# Patient Record
Sex: Male | Born: 1940
Health system: Southern US, Community
[De-identification: ages and names within clinical notes are randomized; demographics above are authoritative.]

## PROBLEM LIST (undated history)

## (undated) DIAGNOSIS — N400 Enlarged prostate without lower urinary tract symptoms: Secondary | ICD-10-CM

## (undated) DIAGNOSIS — E785 Hyperlipidemia, unspecified: Secondary | ICD-10-CM

## (undated) DIAGNOSIS — L309 Dermatitis, unspecified: Secondary | ICD-10-CM

## (undated) DIAGNOSIS — N486 Induration penis plastica: Secondary | ICD-10-CM

## (undated) DIAGNOSIS — L509 Urticaria, unspecified: Secondary | ICD-10-CM

## (undated) DIAGNOSIS — H33319 Horseshoe tear of retina without detachment, unspecified eye: Secondary | ICD-10-CM

## (undated) DIAGNOSIS — H269 Unspecified cataract: Secondary | ICD-10-CM

## (undated) DIAGNOSIS — T7840XA Allergy, unspecified, initial encounter: Secondary | ICD-10-CM

## (undated) HISTORY — DX: Unspecified cataract: H26.9

## (undated) HISTORY — PX: TONSILLECTOMY: SUR1361

## (undated) HISTORY — DX: Induration penis plastica: N48.6

## (undated) HISTORY — DX: Dermatitis, unspecified: L30.9

## (undated) HISTORY — DX: Allergy, unspecified, initial encounter: T78.40XA

## (undated) HISTORY — DX: Horseshoe tear of retina without detachment, unspecified eye: H33.319

## (undated) HISTORY — DX: Hyperlipidemia, unspecified: E78.5

## (undated) HISTORY — PX: COLONOSCOPY: SHX174

## (undated) HISTORY — DX: Urticaria, unspecified: L50.9

## (undated) HISTORY — DX: Benign prostatic hyperplasia without lower urinary tract symptoms: N40.0

---

## 1992-11-06 HISTORY — PX: EYE SURGERY: SHX253

## 1999-09-20 ENCOUNTER — Emergency Department (HOSPITAL_COMMUNITY): Admission: EM | Admit: 1999-09-20 | Discharge: 1999-09-20 | Payer: Self-pay | Admitting: Emergency Medicine

## 1999-12-14 ENCOUNTER — Encounter: Admission: RE | Admit: 1999-12-14 | Discharge: 1999-12-14 | Payer: Self-pay | Admitting: Internal Medicine

## 1999-12-14 ENCOUNTER — Encounter: Payer: Self-pay | Admitting: Internal Medicine

## 2000-04-16 ENCOUNTER — Ambulatory Visit (HOSPITAL_COMMUNITY): Admission: RE | Admit: 2000-04-16 | Discharge: 2000-04-17 | Payer: Self-pay | Admitting: Ophthalmology

## 2000-04-16 ENCOUNTER — Encounter: Payer: Self-pay | Admitting: Ophthalmology

## 2000-04-24 ENCOUNTER — Encounter: Admission: RE | Admit: 2000-04-24 | Discharge: 2000-04-24 | Payer: Self-pay | Admitting: Internal Medicine

## 2000-04-24 ENCOUNTER — Encounter: Payer: Self-pay | Admitting: Internal Medicine

## 2001-01-28 ENCOUNTER — Encounter: Payer: Self-pay | Admitting: Internal Medicine

## 2001-01-28 ENCOUNTER — Encounter: Admission: RE | Admit: 2001-01-28 | Discharge: 2001-01-28 | Payer: Self-pay | Admitting: Internal Medicine

## 2004-02-24 ENCOUNTER — Encounter: Payer: Self-pay | Admitting: Ophthalmology

## 2004-02-25 ENCOUNTER — Ambulatory Visit (HOSPITAL_COMMUNITY): Admission: RE | Admit: 2004-02-25 | Discharge: 2004-02-25 | Payer: Self-pay | Admitting: Ophthalmology

## 2004-03-18 ENCOUNTER — Ambulatory Visit (HOSPITAL_COMMUNITY): Admission: RE | Admit: 2004-03-18 | Discharge: 2004-03-18 | Payer: Self-pay | Admitting: Ophthalmology

## 2004-07-12 ENCOUNTER — Encounter: Admission: RE | Admit: 2004-07-12 | Discharge: 2004-07-12 | Payer: Self-pay | Admitting: Internal Medicine

## 2005-03-27 ENCOUNTER — Ambulatory Visit (HOSPITAL_COMMUNITY): Admission: RE | Admit: 2005-03-27 | Discharge: 2005-03-27 | Payer: Self-pay | Admitting: Ophthalmology

## 2005-10-12 ENCOUNTER — Encounter: Admission: RE | Admit: 2005-10-12 | Discharge: 2005-10-12 | Payer: Self-pay | Admitting: Internal Medicine

## 2007-04-07 HISTORY — PX: EYE SURGERY: SHX253

## 2007-09-05 ENCOUNTER — Encounter: Admission: RE | Admit: 2007-09-05 | Discharge: 2007-09-05 | Payer: Self-pay | Admitting: Internal Medicine

## 2008-10-06 ENCOUNTER — Ambulatory Visit: Payer: Self-pay | Admitting: Internal Medicine

## 2009-10-14 ENCOUNTER — Ambulatory Visit: Payer: Self-pay | Admitting: Internal Medicine

## 2010-10-27 ENCOUNTER — Ambulatory Visit: Payer: Self-pay | Admitting: Internal Medicine

## 2011-01-26 ENCOUNTER — Encounter: Payer: Self-pay | Admitting: Gastroenterology

## 2011-02-02 NOTE — Letter (Signed)
Summary: Colonoscopy Letter  [facility]  [address] [place], [place] [address]   Phone: [phone]  Fax: [phone]      January 26, 2011 MRN: [id number]   KAYRON HICKLIN [address] [place], [place]  [address]   Dear Mr. [facility],   According to your medical record, it is time for you to schedule a Colonoscopy. The American Cancer Society recommends this procedure as a method to detect early colon cancer. Patients with a family history of colon cancer, or a personal history of colon polyps or inflammatory bowel disease are at increased risk.  This letter has been generated based on the recommendations made at the time of your procedure. If you feel that in your particular situation this may no longer apply, please contact our office.  Please call our office at [phone] to schedule this appointment or to update your records at your earliest convenience.  Thank you for cooperating with [place] to provide you with the very best care possible.   Sincerely,  Judie Petit T. Russella Dar, M.D.  [facility] Gastroenterology Division [phone]

## 2011-03-24 NOTE — Op Note (Signed)
NAMEACEYN, [name]               ACCOUNT NO.:  [id number]   MEDICAL RECORD NO.:  [id number]          PATIENT TYPE:  OIB   LOCATION:  2550                         FACILITY:  [facility]   PHYSICIAN:  Alford Highland. Rankin, M.D.   DATE OF BIRTH:  1941-02-02   DATE OF PROCEDURE:  03/27/2005  DATE OF DISCHARGE:  03/27/2005                                 OPERATIVE REPORT   PREOPERATIVE DIAGNOSES:  1.  Chronic recurrent cystoid macular edema, left eye.  2.  Epiretinal membrane, left eye.  3.  History of proliferative vitreoretinopathy, left eye and chronic      inflammatory debris in the vitreous cavity leading to #1 condition.   POSTOPERATIVE DIAGNOSES:  1.  Chronic recurrent cystoid macular edema, left eye.  2.  Epiretinal membrane, left eye.  3.  History of proliferative vitreoretinopathy, left eye and chronic      inflammatory debris in the vitreous cavity leading to #1 condition.   OPERATION PERFORMED:  1.  Posterior vitrectomy and membrane peel, left eye.  2.  Endolaser panretinal photocoagulation, retinopexy left eye posterior to      previous scars.   SURGEON:  Alford Highland. Rankin, M.D.   ANESTHESIA:  Local retrobulbar with monitored anesthesia control.   INDICATIONS FOR PROCEDURE:  The patient is a 70 year old man who has  profound vision loss on the basis of chronic recurrent cystoid macular edema  and this is an attempt to release and remove possible vitreal inflammatory  debris as well as iridovitreal and possibly vitreocapsular adhesions leading  to recurrent cystoid macular edema, left eye.  The patient has a history of  proliferative vitreoretinopathy, chronic retinal detachment repaired with  complex retinal detachment repair.  This is an attempt to improve vascular  function.  The patient understands the risks of anesthesia including the  rare occurrence of death but also to the eye including but not limited to  hemorrhage, infection, scarring, need for another surgery, no change  in  vision, loss of vision and progression of disease despite intervention.  After appropriate signed consent was obtained, the patient was taken to the  operating room.   DESCRIPTION OF PROCEDURE:  In the operating room, appropriate monitoring was  followed by mild sedation.  0.75% Marcaine delivered 5 cc retrobulbar  followed by an additional 5 cc laterally in the fashion of modified Darel Hong.  Left periocular region was sterilely prepped and draped in the usual  ophthalmic fashion.  Microscope placed in position with the Biom attached.  A 25 gauge trocar system was then used in the inferotemporal quadrant with  displacement of the conjunctiva.  The infusion was turned on.  Superior  trocars were placed.  Core vitrectomy was then begun on the 25 gauge system.  Vitreus was removed from the anterior segment attachments apparently to the  iris and capsule as well as the removal of the vitreous skirt.  No posterior  vitreoretinal traction was done on the macular region.  There was no  residual vitreous over the posterior pole or nasal to the optic nerve.  Vitreous skirt trimmed 360 degrees.  Endolaser photocoagulation placed  posterior to the previously treated retina, so as to minimize recurrence of  the retinal tears and detachments after tractional forces had been exerted.  At this time epiretinal forceps were then used to remove internal limiting  membrane.  Partial removal was carried out superiorly to the junction of the  macular region.  Decision was made to leave this in place because there  appeared to be no topographic distortion or internal limiting membrane  striae.   At this time the instruments were removed from the eye.  Under low infusion  pressures, the trocar system was removed.  The conjunctiva was displaced and  compression of the sclerotomies was then carried out for approximately 45  seconds.  The other two trocars were removed in similar fashion.  Intraocular  pressure assessed and found to be adequate. Infranasal placement  of steroids was applied subconjunctivally.  Sterile patch and Fox shield  applied.  The patient tolerated the procedure well without complication.       GAR/MEDQ  D:  03/27/2005  T:  03/27/2005  Job:  [id number]

## 2011-03-24 NOTE — Op Note (Signed)
[name], [name]                         ACCOUNT NO.:  [id number]   MEDICAL RECORD NO.:  [id number]                   PATIENT TYPE:  OIB   LOCATION:  2899                                 FACILITY:  [facility]   PHYSICIAN:  Alford Highland. Rankin, M.D.                DATE OF BIRTH:  1941-03-18   DATE OF PROCEDURE:  03/18/2004  DATE OF DISCHARGE:  03/18/2004                                 OPERATIVE REPORT   PREOPERATIVE DIAGNOSES:  1. Proliferative vitreoretinopathy, stage C2 combined with  2. Rhegmatogenous retinal detachment, tractional detachment to the right     eye.   POSTOPERATIVE DIAGNOSES:  1. Proliferative vitreoretinopathy, stage C2 combined with  2. Rhegmatogenous retinal detachment, tractional detachment to the right     eye.   OPERATION PERFORMED:  1. Posterior vitrectomy and membrane peel, right eye.  2. Endolaser photocoagulation for retinopexy purposes.  3. Injection of vitreous substitute, C3F8 10%.   SURGEON:  Alford Highland. Rankin, M.D.   ANESTHESIA:  Local bulbar with monitored anesthesia control.   INDICATIONS FOR PROCEDURE:  The patient is a 70 year old man who has  recurrent rhegmatogenous retinal detachment combined with tractional changes  from proliferative vitreoretinopathy.  Initially, superior retinal  detachment, superior breaks had been previously repaired with scleral buckle  and he now has a recurrent detachment.  It is inferior with inferior retinal  breaks.  The patient understands this is an attempt to release the fibrous  contracture in this area by removing the vitreous scaffold and the  epiretinal scar tissues triggering this recurrent detachment.  The patient understands the risks of anesthesia including the rare  occurrence of death but also to the eye including but not limited to  hemorrhage, infection, scarring, need for another surgery, no change in  vision, loss of vision and progression of disease despite intervention.  After appropriate signed  consent was obtained, the patient was taken to the  operating room.   DESCRIPTION OF PROCEDURE:  In the operating room, appropriate monitoring was  followed by mild sedation.  0.75% Marcaine delivered 5 mL retrobulbar  followed by an additional 5 mL laterally in the fashion of modified Darel Hong.  The right periocular region was sterilely prepped and draped in the  usual ophthalmic fashion.  A lid speculum was applied.  Conjunctival  peritomy was fashioned supranasally and temporally.  A 4 mm infusion secured  3.5 mm posterior to the limbus in the infratemporal quadrant.  Placement in  the vitreous cavity verified visually.  Superior sclerotomies then  fashioned.  Wild microscope was placed in position with the Biom attachment.  Core vitrectomy was then begun.  Peripheral vitreous and hyaloid was removed  using vitreous base dissection techniques.  Excellent mobilization of the  retina occurred.  Necessary to perform inferior retinotomy anterior to the  optic nerve with endo diathermy.  Fluid-fluid exchange carried out.  Fluid-  air exchange carried  out.  Endolaser panphotocoagulation was placed around  the retinotomy site as well as around the retinal breaks on the slope of the  posterior slope buckle.  The retina reattached nicely without difficulty.  An air-C3F8 10% exchange was then completed.  Superior sclerotomy was closed  with 7-0 Vicryl.  Infusion was removed and similarly closed with 7-0 Vicryl.  Conjunctiva closed with 7-0 Vicryl.  Intraocular pressure was assessed and  found to be adequate at less than 20 mm.  Sterile patch and Fox shield were  applied after subconjunctival injection of antibiotic and steroid had been  applied.  The patient tolerated the procedure well without complication. The  patient was then transferred to the short stay area to be discharged home as  outpatient.                                               Alford Highland Rankin, M.D.    GAR/MEDQ  D:   03/23/2004  T:  03/23/2004  Job:  [id number]

## 2011-03-24 NOTE — Op Note (Signed)
[name], [name]                         ACCOUNT NO.:  [id number]   MEDICAL RECORD NO.:  [id number]                   PATIENT TYPE:  OIB   LOCATION:  2857                                 FACILITY:  [facility]   PHYSICIAN:  Alford Highland. Rankin, M.D.                DATE OF BIRTH:  [birth year]   DATE OF PROCEDURE:  02/25/2004  DATE OF DISCHARGE:  02/25/2004                                 OPERATIVE REPORT   PREOPERATIVE DIAGNOSIS:  Rhegmatogenous retinal detachment of the right eye  - macula off.   POSTOPERATIVE DIAGNOSIS:  Rhegmatogenous retinal detachment of the right eye  - macula off.   PROCEDURE:  1. Scleral buckle using elements 240, 70, and 287 explants with retinal     cryopexy, OD.  2. External drainage of subretinal fluid, OD.   SURGEON:  Alford Highland. Rankin, M.D.   ANESTHESIA:  General endotracheal anesthesia.   INDICATIONS FOR PROCEDURE:  The patient is a 70 year old man who has  profound vision loss on the basis of rhegmatogenous retinal detachment.  This is an attempt to reattach the retina.  The understands the risks of  anesthesia including the occurrence of death and also to the eye, including  but not limited to hemorrhage, infection, scarring, need for further  surgery, no change in vision, loss of vision, and even loss of the eye.  He  wished to proceed and appropriate signed consent was obtained.   DESCRIPTION OF PROCEDURE:  The patient was taken to the operating room.  General endotracheal anesthesia was instituted without difficulty.  The  right periocular region was sterilely prepped and draped in the usual  ophthalmic fashion.  A lid speculum was applied.  A conjunctival peritomy  was then fashioned 360 degrees.  The rectus muscles were isolated on 2-0  silk ties.  Cryopexy was applied to multiple nearly side by side retinal  horseshoe tears in the temporal periphery of the right eye extending from  the 5:30 position temporally and superiorly to the 12:30 position  OD.  These  were treated with retinal cryopexy under direct observation.  A silicone  explant 287 was selected and placed from the 1 o'clock position extending  temporally to the 9 o'clock and then extending inferiorly to the 6 o'clock  and over to the 5 o'clock with a 240 encircling band and a 7-0 Watzke sleeve  which was placed in the superonasal quadrant.  Two sutures were placed in  each of the temporal quadrants, one each in the nasal quadrants.  These  secured the buckle without difficulty in a temporary fashion.  External  drainage of subretinal fluid was carried out under direct observation in the  bed of the buckle with a 26 gauge needle.  All fluid was removed without  difficulty.  The buckle was then tightened permanently and securely.  All  subretinal fluid was removed without complications.  At this time, the  bed  of the buckle was irrigated.  The bands were trimmed.  The band was  tightened appropriately.  The conjunctiva and tenons were brought forward  and closed in layers with interrupted 7-0 Vicryl suture.  A subconjunctival  injection of antibiotics were applied.  The patient tolerated the procedure  well without complications.                                               Alford Highland Rankin, M.D.    GAR/MEDQ  D:  02/25/2004  T:  02/26/2004  Job:  [id number]

## 2011-06-30 ENCOUNTER — Encounter: Payer: Self-pay | Admitting: Internal Medicine

## 2011-07-06 ENCOUNTER — Ambulatory Visit (INDEPENDENT_AMBULATORY_CARE_PROVIDER_SITE_OTHER): Payer: Medicare Other | Admitting: Internal Medicine

## 2011-07-06 ENCOUNTER — Encounter: Payer: Self-pay | Admitting: Internal Medicine

## 2011-07-06 DIAGNOSIS — N486 Induration penis plastica: Secondary | ICD-10-CM | POA: Insufficient documentation

## 2011-07-06 DIAGNOSIS — N529 Male erectile dysfunction, unspecified: Secondary | ICD-10-CM

## 2011-07-06 DIAGNOSIS — E785 Hyperlipidemia, unspecified: Secondary | ICD-10-CM | POA: Insufficient documentation

## 2011-07-06 DIAGNOSIS — F411 Generalized anxiety disorder: Secondary | ICD-10-CM

## 2011-07-06 DIAGNOSIS — F419 Anxiety disorder, unspecified: Secondary | ICD-10-CM

## 2011-07-06 DIAGNOSIS — N400 Enlarged prostate without lower urinary tract symptoms: Secondary | ICD-10-CM

## 2011-07-06 NOTE — Progress Notes (Signed)
  Subjective:    Patient ID: [name], male    DOB: Jun 21, 1941, 70 y.o.   MRN: [id number]  HPI pleasant healthy 70 year old white male whose wife died of end-stage COPD about 2 years ago. He spent many years taking care of her. He has a history of BPH and Peyronie's disease. History of hyperlipidemia.  Recently he met a lady at church and they began dating. He is quite happy. He is concerned however about erectile dysfunction and has anxiety about that. He has not been sexually active with her as he feels it is wrong to have premarital sex. Patient has been able to get an erection in the mornings when awakening.    Review of Systems     Objective:   Physical Exam not examined        Assessment & Plan:  Counseled patient for some 20 minutes regarding these issues. Gave him samples of Cialis 5 mg daily for daily use. Alternatively he could consider taking 10-20 mg of Cialis one hour before intercourse. For anxiety gave him Xanax 0.5 mg #30 one half to one by mouth when necessary anxiety with 1 refill.

## 2011-09-21 ENCOUNTER — Ambulatory Visit (INDEPENDENT_AMBULATORY_CARE_PROVIDER_SITE_OTHER): Payer: Medicare Other | Admitting: Internal Medicine

## 2011-09-21 DIAGNOSIS — Z23 Encounter for immunization: Secondary | ICD-10-CM

## 2011-10-19 ENCOUNTER — Encounter: Payer: Self-pay | Admitting: Internal Medicine

## 2011-10-19 ENCOUNTER — Ambulatory Visit (INDEPENDENT_AMBULATORY_CARE_PROVIDER_SITE_OTHER): Payer: Medicare Other | Admitting: Internal Medicine

## 2011-10-19 DIAGNOSIS — N400 Enlarged prostate without lower urinary tract symptoms: Secondary | ICD-10-CM

## 2011-10-19 DIAGNOSIS — Z0000 Encounter for general adult medical examination without abnormal findings: Secondary | ICD-10-CM

## 2011-10-19 DIAGNOSIS — Z23 Encounter for immunization: Secondary | ICD-10-CM

## 2011-10-19 DIAGNOSIS — R6882 Decreased libido: Secondary | ICD-10-CM

## 2011-10-19 DIAGNOSIS — N529 Male erectile dysfunction, unspecified: Secondary | ICD-10-CM

## 2011-10-19 LAB — CBC WITH DIFFERENTIAL/PLATELET
Basophils Absolute: 0 10*3/uL (ref 0.0–0.1)
Basophils Relative: 1 % (ref 0–1)
Eosinophils Absolute: 0.1 10*3/uL (ref 0.0–0.7)
Eosinophils Relative: 3 % (ref 0–5)
HCT: 47 % (ref 39.0–52.0)
Hemoglobin: 15.4 g/dL (ref 13.0–17.0)
Lymphocytes Relative: 33 % (ref 12–46)
Lymphs Abs: 1.8 10*3/uL (ref 0.7–4.0)
MCH: 29.3 pg (ref 26.0–34.0)
MCHC: 32.8 g/dL (ref 30.0–36.0)
MCV: 89.4 fL (ref 78.0–100.0)
Monocytes Absolute: 0.7 10*3/uL (ref 0.1–1.0)
Monocytes Relative: 13 % — ABNORMAL HIGH (ref 3–12)
Neutro Abs: 2.9 10*3/uL (ref 1.7–7.7)
Neutrophils Relative %: 51 % (ref 43–77)
Platelets: 317 10*3/uL (ref 150–400)
RBC: 5.26 MIL/uL (ref 4.22–5.81)
RDW: 14.1 % (ref 11.5–15.5)
WBC: 5.6 10*3/uL (ref 4.0–10.5)

## 2011-10-19 LAB — COMPREHENSIVE METABOLIC PANEL
ALT: 13 U/L (ref 0–53)
AST: 18 U/L (ref 0–37)
Albumin: 4.6 g/dL (ref 3.5–5.2)
Alkaline Phosphatase: 104 U/L (ref 39–117)
BUN: 17 mg/dL (ref 6–23)
CO2: 30 mEq/L (ref 19–32)
Calcium: 10.3 mg/dL (ref 8.4–10.5)
Chloride: 102 mEq/L (ref 96–112)
Creat: 0.78 mg/dL (ref 0.50–1.35)
Glucose, Bld: 90 mg/dL (ref 70–99)
Potassium: 4.8 mEq/L (ref 3.5–5.3)
Sodium: 141 mEq/L (ref 135–145)
Total Bilirubin: 0.7 mg/dL (ref 0.3–1.2)
Total Protein: 7.3 g/dL (ref 6.0–8.3)

## 2011-10-19 LAB — TESTOSTERONE: Testosterone: 743.13 ng/dL (ref 250–890)

## 2011-10-19 LAB — PSA: PSA: 3.8 ng/mL (ref <=4.00)

## 2011-10-19 LAB — LIPID PANEL
Cholesterol: 215 mg/dL — ABNORMAL HIGH (ref 0–200)
HDL: 54 mg/dL (ref >39)
LDL Cholesterol: 140 mg/dL — ABNORMAL HIGH (ref 0–99)
Total CHOL/HDL Ratio: 4 Ratio
Triglycerides: 107 mg/dL (ref <150)
VLDL: 21 mg/dL (ref 0–40)

## 2011-10-19 MED ORDER — PNEUMOCOCCAL VAC POLYVALENT 25 MCG/0.5ML IJ INJ: 0.5000 mL | INJECTION | Freq: Once | INTRAMUSCULAR | Status: DC

## 2011-11-02 ENCOUNTER — Encounter: Payer: Medicare Other | Admitting: Internal Medicine

## 2011-11-06 ENCOUNTER — Encounter: Payer: Self-pay | Admitting: Internal Medicine

## 2011-11-06 NOTE — Progress Notes (Signed)
  Subjective:    Patient ID: [name], male    DOB: [birth year], 70 y.o.   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 History of BPH. Recently he met a lady through his church and they have become sexually active. He's having some issues with erectile dysfunction. Has tried samples of Cialis with some success but can't sustain erection. This is causing some anxiety. Patient does not smoke. No history of hypertension. No history of diabetes. He is retired but stays physically active. No obesity.    Review of Systems  Constitutional: Negative.   HENT: Negative.   Eyes: Negative.   Cardiovascular: Negative.   Gastrointestinal: Negative.   Genitourinary:       Erectile dysfunction  Musculoskeletal:       Minor aches and pains  Neurological: Negative.   Hematological: Negative.   Psychiatric/Behavioral: Negative.        Objective:   Physical Exam  Vitals reviewed. Constitutional: He is oriented to person, place, and time. He appears well-nourished. No distress.  HENT:  Head: Normocephalic and atraumatic.  Right Ear: External ear normal.  Left Ear: External ear normal.  Nose: Nose normal.  Mouth/Throat: Oropharynx is clear and moist.  Eyes: Conjunctivae and EOM are normal. Pupils are equal, round, and reactive to light. Right eye exhibits no discharge. Left eye exhibits no discharge. No scleral icterus.  Neck: Neck supple. No JVD present. No thyromegaly present.  Cardiovascular: Normal rate, regular rhythm, normal heart sounds and intact distal pulses.   No murmur heard. Pulmonary/Chest: He has no wheezes. He has no rales.  Abdominal: Soft. Bowel sounds are normal. He exhibits no distension and no mass. There is no tenderness. There is no rebound and no guarding.  Genitourinary: Penis normal.       Enlarged prostate without nodules  Musculoskeletal: Normal range of motion. He  exhibits no edema.  Lymphadenopathy:    He has no cervical adenopathy.  Neurological: He is alert and oriented to person, place, and time. He has normal reflexes. No cranial nerve deficit. Coordination normal.  Skin: Skin is warm and dry. No rash noted.  Psychiatric: He has a normal mood and affect. His behavior is normal. Judgment normal.          Assessment & Plan:  BPH  Erectile dysfunction  History of mild hyperlipidemia  Plan: Return one year or as needed. Pneumococcal vaccine given today.

## 2011-11-06 NOTE — Patient Instructions (Addendum)
Try medication for erectile dysfunction one hour prior to intercourse. Return one year or as needed. Pneumococcal vaccine given today.

## 2012-03-18 ENCOUNTER — Other Ambulatory Visit: Payer: Self-pay | Admitting: Internal Medicine

## 2012-07-01 ENCOUNTER — Other Ambulatory Visit: Payer: Self-pay | Admitting: Internal Medicine

## 2012-07-24 ENCOUNTER — Encounter: Payer: Self-pay | Admitting: Gastroenterology

## 2012-11-01 ENCOUNTER — Encounter: Payer: Self-pay | Admitting: Internal Medicine

## 2012-11-01 ENCOUNTER — Other Ambulatory Visit (INDEPENDENT_AMBULATORY_CARE_PROVIDER_SITE_OTHER): Payer: Medicare Other | Admitting: Internal Medicine

## 2012-11-01 ENCOUNTER — Ambulatory Visit (INDEPENDENT_AMBULATORY_CARE_PROVIDER_SITE_OTHER): Payer: Medicare Other | Admitting: Internal Medicine

## 2012-11-01 VITALS — BP 130/80 | HR 96 | Temp 98.5°F | Ht 73.5 in | Wt 158.0 lb

## 2012-11-01 DIAGNOSIS — N486 Induration penis plastica: Secondary | ICD-10-CM

## 2012-11-01 DIAGNOSIS — Z79899 Other long term (current) drug therapy: Secondary | ICD-10-CM

## 2012-11-01 DIAGNOSIS — Z0000 Encounter for general adult medical examination without abnormal findings: Secondary | ICD-10-CM

## 2012-11-01 DIAGNOSIS — Z23 Encounter for immunization: Secondary | ICD-10-CM

## 2012-11-01 DIAGNOSIS — N400 Enlarged prostate without lower urinary tract symptoms: Secondary | ICD-10-CM

## 2012-11-01 DIAGNOSIS — E785 Hyperlipidemia, unspecified: Secondary | ICD-10-CM

## 2012-11-01 DIAGNOSIS — N529 Male erectile dysfunction, unspecified: Secondary | ICD-10-CM

## 2012-11-01 LAB — COMPREHENSIVE METABOLIC PANEL
ALT: 14 U/L (ref 0–53)
AST: 18 U/L (ref 0–37)
Albumin: 4.5 g/dL (ref 3.5–5.2)
Alkaline Phosphatase: 98 U/L (ref 39–117)
BUN: 16 mg/dL (ref 6–23)
CO2: 30 mEq/L (ref 19–32)
Calcium: 9.7 mg/dL (ref 8.4–10.5)
Chloride: 101 mEq/L (ref 96–112)
Creat: 0.9 mg/dL (ref 0.50–1.35)
Glucose, Bld: 93 mg/dL (ref 70–99)
Potassium: 5.4 mEq/L — ABNORMAL HIGH (ref 3.5–5.3)
Sodium: 141 mEq/L (ref 135–145)
Total Bilirubin: 0.4 mg/dL (ref 0.3–1.2)
Total Protein: 7.1 g/dL (ref 6.0–8.3)

## 2012-11-01 LAB — CBC WITH DIFFERENTIAL/PLATELET
Basophils Absolute: 0.1 10*3/uL (ref 0.0–0.1)
Basophils Relative: 1 % (ref 0–1)
Eosinophils Absolute: 0.2 10*3/uL (ref 0.0–0.7)
Eosinophils Relative: 4 % (ref 0–5)
HCT: 44.9 % (ref 39.0–52.0)
Hemoglobin: 15.3 g/dL (ref 13.0–17.0)
Lymphocytes Relative: 31 % (ref 12–46)
Lymphs Abs: 1.5 10*3/uL (ref 0.7–4.0)
MCH: 29.8 pg (ref 26.0–34.0)
MCHC: 34.1 g/dL (ref 30.0–36.0)
MCV: 87.5 fL (ref 78.0–100.0)
Monocytes Absolute: 0.7 10*3/uL (ref 0.1–1.0)
Monocytes Relative: 14 % — ABNORMAL HIGH (ref 3–12)
Neutro Abs: 2.4 10*3/uL (ref 1.7–7.7)
Neutrophils Relative %: 50 % (ref 43–77)
Platelets: 359 10*3/uL (ref 150–400)
RBC: 5.13 MIL/uL (ref 4.22–5.81)
RDW: 13.9 % (ref 11.5–15.5)
WBC: 4.7 10*3/uL (ref 4.0–10.5)

## 2012-11-01 LAB — LIPID PANEL
Cholesterol: 251 mg/dL — ABNORMAL HIGH (ref 0–200)
HDL: 50 mg/dL (ref >39)
LDL Cholesterol: 174 mg/dL — ABNORMAL HIGH (ref 0–99)
Total CHOL/HDL Ratio: 5 Ratio
Triglycerides: 136 mg/dL (ref <150)
VLDL: 27 mg/dL (ref 0–40)

## 2012-11-01 NOTE — Progress Notes (Signed)
Subjective:    Patient ID: [name], male    DOB: [birth year], 71 y.o.   MRN: [id number]  HPI 71 year old White male for health maintenance. Sees Dr. Earlene Plater for urology. Hx BPH and erectile dysfunction. History of Peyronie's disease and hyperlipidemia. His wife died a few years ago of complications from COPD. He subsequently has met a lady and began dating. They are sexually active. He seems happy but doesn't think he wants to remarry.  He has tried samples of Cialis with some success but can't sustain erection. This is causing some anxiety.  I have asked him to speak with urologist about this.  He does not smoke. No history of hypertension. No history of diabetes. He is retired and stays physically active. Likes to hunt ride his motorcycle.  Social history: He is a widow work, completed high school and worked as a Merchandiser, retail for [employer].  Family history: Father died at age 68 with cancer. One brother in good health. He lost his son in a car accident a number of years before his wife died. He has no other children.  He had tonsillectomy 1949, bilateral cataract extraction 1994, surgery for retinal detachment of the left eye by Dr. Luciana Axe June 2001. Had colonoscopy in March 2005. Pneumovax immunization December 2012. Has had positional vertigo in the past an episode of cervical radiculopathy. Had exercise tolerance test 1999  He had a recent URI in November. He was seen in urgent care and was treated with prednisone and a cephalosporin.    Review of Systems  Constitutional: Negative.   HENT: Negative.   Eyes: Negative.   Respiratory: Negative.   Cardiovascular: Negative.   Gastrointestinal: Negative.   Genitourinary:       Erectile dysfunction and BPH  Neurological: Negative.   Hematological: Negative.   Psychiatric/Behavioral: Negative.        Objective:   Physical Exam  Vitals reviewed. Constitutional: He is oriented to person, place, and time. He appears  well-developed and well-nourished. No distress.  HENT:  Head: Normocephalic and atraumatic.  Right Ear: External ear normal.  Left Ear: External ear normal.  Mouth/Throat: No oropharyngeal exudate.  Eyes: Conjunctivae normal and EOM are normal. Pupils are equal, round, and reactive to light. Right eye exhibits no discharge. Left eye exhibits no discharge. No scleral icterus.  Neck: Neck supple. No JVD present. No thyromegaly present.  Cardiovascular: Normal rate, regular rhythm, normal heart sounds and intact distal pulses.   No murmur heard. Pulmonary/Chest: Effort normal and breath sounds normal. He has no wheezes. He has no rales. He exhibits no tenderness.  Abdominal: Soft. Bowel sounds are normal. He exhibits no distension and no mass. There is no tenderness. There is no rebound and no guarding.  Genitourinary:       Deferred to urologist whom he'll see next week  Musculoskeletal: He exhibits no edema.  Lymphadenopathy:    He has no cervical adenopathy.  Neurological: He is alert and oriented to person, place, and time. He has normal reflexes. No cranial nerve deficit. Coordination normal.  Skin: Skin is dry. No rash noted. He is not diaphoretic.  Psychiatric: He has a normal mood and affect. His behavior is normal. Judgment and thought content normal.          Assessment & Plan:  Hyperlipidemia  Erectile dysfunction  BPH  History of Peyronie's disease  Plan: Continue statin therapy and return in one year. Continue to see urologist and ask urologist about erectile dysfunction  treatment. Blood pressure was initially elevated on arrival but was rechecked and was 130/80.           Subjective:   Patient presents for Medicare Annual/Subsequent preventive examination.   Review Past Medical/Family/Social:See Epic   Risk Factors  Current exercise habits: walk , rides motorcycle. hunts Dietary issues discussed: low fat low carb   Cardiac risk factors:  Hyperlipidemia  Depression Screen  (Note: if answer to either of the following is "Yes", a more complete depression screening is indicated)   Over the past two weeks, have you felt down, depressed or hopeless? No  Over the past two weeks, have you felt little interest or pleasure in doing things? No Have you lost interest or pleasure in daily life? No Do you often feel hopeless? No Do you cry easily over simple problems? No   Activities of Daily Living  In your present state of health, do you have any difficulty performing the following activities?:   Driving? No  Managing money? No  Feeding yourself? No  Getting from bed to chair? No  Climbing a flight of stairs? No  Preparing food and eating?: No  Bathing or showering? No  Getting dressed: No  Getting to the toilet? No  Using the toilet:No  Moving around from place to place: No  In the past year have you fallen or had a near fall?:No  Are you sexually active? yes Do you have more than one partner? No   Hearing Difficulties: No  Do you often ask people to speak up or repeat themselves? No  Do you experience ringing or noises in your ears? No  Do you have difficulty understanding soft or whispered voices? No  Do you feel that you have a problem with memory? No Do you often misplace items? No    Home Safety:  Do you have a smoke alarm at your residence? Yes Do you have grab bars in the bathroom? yes Do you have throw rugs in your house? no   Cognitive Testing  Alert? Yes Normal Appearance?Yes  Oriented to person? Yes Place? Yes  Time? Yes  Recall of three objects? Yes  Can perform simple calculations? Yes  Displays appropriate judgment?Yes  Can read the correct time from a watch face?Yes   List the Names of Other Physician/Practitioners you currently use:  See referral list for the physicians patient is currently seeing. Dr. Darvin Neighbours. Urology    Review of Systems: see EPIC   Objective:     General  appearance: Appears stated age  Head: Normocephalic, without obvious abnormality, atraumatic  Eyes: conj clear, EOMi PEERLA  Ears: normal TM's and external ear canals both ears  Nose: Nares normal. Septum midline. Mucosa normal. No drainage or sinus tenderness.  Throat: lips, mucosa, and tongue normal; teeth and gums normal  Neck: no adenopathy, no carotid bruit, no JVD, supple, symmetrical, trachea midline and thyroid not enlarged, symmetric, no tenderness/mass/nodules  No CVA tenderness.  Lungs: clear to auscultation bilaterally  Breasts: normal male Heart: regular rate and rhythm, S1, S2 normal, no murmur, click, rub or gallop  Abdomen: soft, non-tender; bowel sounds normal; no masses, no organomegaly  Musculoskeletal: ROM normal in all joints, no crepitus, no deformity, Normal muscle strengthen. Back  is symmetric, no curvature. Skin: Skin color, texture, turgor normal. No rashes or lesions  Lymph nodes: Cervical, supraclavicular, and axillary nodes normal.  Neurologic: CN 2 -12 Normal, Normal symmetric reflexes. Normal coordination and gait  Psych: Alert & Oriented x  3, Mood appear stable.    Assessment:    Annual wellness medicare exam   Plan:    During the course of the visit the patient was educated and counseled about appropriate screening and preventive services including:  Influenza Tdap Had pneumovax 2012       Patient Instructions (the written plan) was given to the patient.  Medicare Attestation  I have personally reviewed:  The patient's medical and social history  Their use of alcohol, tobacco or illicit drugs  Their current medications and supplements  The patient's functional ability including ADLs,fall risks, home safety risks, cognitive, and hearing and visual impairment  Diet and physical activities  Evidence for depression or mood disorders  The patient's weight, height, BMI, and visual acuity have been recorded in the chart. I have made referrals,  counseling, and provided education to the patient based on review of the above and I have provided the patient with a written personalized care plan for preventive services.

## 2012-11-03 NOTE — Patient Instructions (Addendum)
Watch diet and exercise. Return in one year or as needed. 

## 2012-11-04 DIAGNOSIS — N400 Enlarged prostate without lower urinary tract symptoms: Secondary | ICD-10-CM | POA: Insufficient documentation

## 2012-11-04 DIAGNOSIS — Z23 Encounter for immunization: Secondary | ICD-10-CM

## 2012-11-04 DIAGNOSIS — R972 Elevated prostate specific antigen [PSA]: Secondary | ICD-10-CM | POA: Insufficient documentation

## 2012-11-04 DIAGNOSIS — N529 Male erectile dysfunction, unspecified: Secondary | ICD-10-CM | POA: Insufficient documentation

## 2012-11-04 DIAGNOSIS — N320 Bladder-neck obstruction: Secondary | ICD-10-CM | POA: Insufficient documentation

## 2012-11-14 ENCOUNTER — Other Ambulatory Visit: Payer: Self-pay | Admitting: Internal Medicine

## 2012-11-19 ENCOUNTER — Other Ambulatory Visit: Payer: Self-pay | Admitting: Internal Medicine

## 2013-03-12 ENCOUNTER — Other Ambulatory Visit: Payer: Self-pay | Admitting: Internal Medicine

## 2013-03-12 NOTE — Telephone Encounter (Signed)
Refill Xanax 0.5 mg with one refill 1/2-1 po bid prn anxiety

## 2013-03-13 ENCOUNTER — Other Ambulatory Visit: Payer: Self-pay

## 2013-07-28 ENCOUNTER — Encounter: Payer: Self-pay | Admitting: Gastroenterology

## 2013-09-03 ENCOUNTER — Ambulatory Visit (INDEPENDENT_AMBULATORY_CARE_PROVIDER_SITE_OTHER): Payer: Medicare Other | Admitting: Gastroenterology

## 2013-09-03 ENCOUNTER — Encounter: Payer: Self-pay | Admitting: Gastroenterology

## 2013-09-03 VITALS — BP 112/60 | HR 100 | Ht 73.25 in | Wt 156.2 lb

## 2013-09-03 DIAGNOSIS — Z8371 Family history of colonic polyps: Secondary | ICD-10-CM

## 2013-09-03 DIAGNOSIS — R1319 Other dysphagia: Secondary | ICD-10-CM

## 2013-09-03 MED ORDER — PEG-KCL-NACL-NASULF-NA ASC-C 100 G PO SOLR: 1.0000 | Freq: Once | ORAL | Status: DC

## 2013-09-03 NOTE — Progress Notes (Signed)
    History of Present Illness: This is a 72 year old male who relates difficulties swallowing large pills and occasionally breakfast foods. He localizes the symptoms to the base of his neck. He states he has frequent sinus drainage and very dry mouth upon awakening. He does not note solid food dysphagia later in the day. Denies reflux symptoms. Denies weight loss, abdominal pain, constipation, diarrhea, change in stool caliber, melena, hematochezia, nausea, vomiting, reflux symptoms, chest pain.  Review of Systems: Pertinent positive and negative review of systems were noted in the above HPI section. All other review of systems were otherwise negative.  Current Medications, Allergies, Past Medical History, Past Surgical History, Family History and Social History were reviewed in [employer] record.  Physical Exam: General: Well developed , well nourished, no acute distress Head: Normocephalic and atraumatic Eyes:  sclerae anicteric, EOMI Ears: Normal auditory acuity Mouth: No deformity or lesions Neck: Supple, no masses or thyromegaly Lungs: Clear throughout to auscultation Heart: Regular rate and rhythm; no murmurs, rubs or bruits Abdomen: Soft, non tender and non distended. No masses, hepatosplenomegaly or hernias noted. Normal Bowel sounds Rectal: Deferred to colonoscopy Musculoskeletal: Symmetrical with no gross deformities  Skin: No lesions on visible extremities Pulses:  Normal pulses noted Extremities: No clubbing, cyanosis, edema or deformities noted Neurological: Alert oriented x 4, grossly nonfocal Cervical Nodes:  No significant cervical adenopathy Inguinal Nodes: No significant inguinal adenopathy Psychological:  Alert and cooperative. Normal mood and affect  Assessment and Recommendations:  1. Dysphagia with large pills and solid foods only in the morning. I suspect his symptoms are pharyngeal. Rule out esophageal strictures with barium esophagram.  If esophageal abnormalities present will plan to proceed with upper endoscopy at the time of his colonoscopy.  2. Family history of colon polyps in his mother. Schedule colonoscopy. The risks, benefits, and alternatives to colonoscopy with possible biopsy and possible polypectomy were discussed with the patient and they consent to proceed.

## 2013-09-03 NOTE — Patient Instructions (Addendum)
You have been scheduled for a Barium Esophogram at [facility] (1st floor of the hospital) on 09/05/13 at 10:00am. Please arrive 15 minutes prior to your appointment for registration. Make certain not to have anything to eat or drink 6 hours prior to your test. If you need to reschedule for any reason, please contact radiology at [phone] to do so. __________________________________________________________________ A barium swallow is an examination that concentrates on views of the esophagus. This tends to be a double contrast exam (barium and two liquids which, when combined, create a gas to distend the wall of the oesophagus) or single contrast (non-ionic iodine based). The study is usually tailored to your symptoms so a good history is essential. Attention is paid during the study to the form, structure and configuration of the esophagus, looking for functional disorders (such as aspiration, dysphagia, achalasia, motility and reflux) EXAMINATION You may be asked to change into a gown, depending on the type of swallow being performed. A radiologist and radiographer will perform the procedure. The radiologist will advise you of the type of contrast selected for your procedure and direct you during the exam. You will be asked to stand, sit or lie in several different positions and to hold a small amount of fluid in your mouth before being asked to swallow while the imaging is performed .In some instances you may be asked to swallow barium coated marshmallows to assess the motility of a solid food bolus. The exam can be recorded as a digital or video fluoroscopy procedure. POST PROCEDURE It will take 1-2 days for the barium to pass through your system. To facilitate this, it is important, unless otherwise directed, to increase your fluids for the next 24-48hrs and to resume your normal diet.  This test typically takes about 30 minutes to  perform. __________________________________________________________________________________   Bonita Quin have been scheduled for a colonoscopy with propofol. Please follow written instructions given to you at your visit today.  Please pick up your prep kit at the pharmacy within the next 1-3 days. If you use inhalers (even only as needed), please bring them with you on the day of your procedure.  Thank you for choosing me and [facility].  Venita Lick. Pleas Koch., MD., [facility]

## 2013-09-04 ENCOUNTER — Encounter: Payer: Self-pay | Admitting: Gastroenterology

## 2013-09-05 ENCOUNTER — Ambulatory Visit (HOSPITAL_COMMUNITY)
Admission: RE | Admit: 2013-09-05 | Discharge: 2013-09-05 | Disposition: A | Discharge status: Home / Self Care | Payer: Medicare Other | Source: Ambulatory Visit | Attending: Gastroenterology | Admitting: Gastroenterology

## 2013-09-05 DIAGNOSIS — K219 Gastro-esophageal reflux disease without esophagitis: Secondary | ICD-10-CM | POA: Insufficient documentation

## 2013-09-05 DIAGNOSIS — R1310 Dysphagia, unspecified: Secondary | ICD-10-CM | POA: Insufficient documentation

## 2013-09-05 DIAGNOSIS — R1319 Other dysphagia: Secondary | ICD-10-CM

## 2013-09-08 ENCOUNTER — Other Ambulatory Visit: Payer: Self-pay

## 2013-09-08 MED ORDER — OMEPRAZOLE 20 MG PO CPDR: 20.0000 mg | DELAYED_RELEASE_CAPSULE | Freq: Every day | ORAL | Status: DC

## 2013-10-09 ENCOUNTER — Encounter: Payer: Self-pay | Admitting: Gastroenterology

## 2013-10-09 ENCOUNTER — Ambulatory Visit (AMBULATORY_SURGERY_CENTER): Payer: Medicare Other | Admitting: Gastroenterology

## 2013-10-09 VITALS — BP 130/74 | HR 76 | Temp 97.1°F | Resp 18 | Ht 73.0 in | Wt 156.0 lb

## 2013-10-09 DIAGNOSIS — Z1211 Encounter for screening for malignant neoplasm of colon: Secondary | ICD-10-CM

## 2013-10-09 DIAGNOSIS — Z8371 Family history of colonic polyps: Secondary | ICD-10-CM

## 2013-10-09 MED ORDER — SODIUM CHLORIDE 0.9 % IV SOLN: 500.0000 mL | INTRAVENOUS | Status: DC

## 2013-10-09 NOTE — Op Note (Signed)
[facility] [address] [place], [address]   COLONOSCOPY PROCEDURE REPORT  PATIENT: Shawn, Davidson  MR#: [id number] BIRTHDATE: 24-Feb-1941 , 72  yrs. old GENDER: Male ENDOSCOPIST: Meryl Dare, MD, [facility] PROCEDURE DATE:  10/09/2013 PROCEDURE:   Colonoscopy, screening First Screening Colonoscopy - Avg.  risk and is 50 yrs.  old or older - No.  Prior Negative Screening - Now for repeat screening. 10 or more years since last screening  History of Adenoma - Now for follow-up colonoscopy & has been > or = to 3 yrs.  N/A  Polyps Removed Today? No.  Recommend repeat exam, <10 yrs? Yes.  High risk (family or personal hx). ASA CLASS:   Class II INDICATIONS:elevated risk screening and Patient's family history of colon polyps. MEDICATIONS: MAC sedation, administered by CRNA and propofol (Diprivan) 200mg  IV DESCRIPTION OF PROCEDURE:   After the risks benefits and alternatives of the procedure were thoroughly explained, informed consent was obtained.  A digital rectal exam revealed no abnormalities of the rectum.   The LB [id number]  endoscope was introduced through the anus and advanced to the cecum, which was identified by both the appendix and ileocecal valve. No adverse events experienced.   The quality of the prep was excellent, using MoviPrep  The instrument was then slowly withdrawn as the colon was fully examined.  COLON FINDINGS: Moderate diverticulosis was noted in the sigmoid colon.   The colon was otherwise normal.  There was no diverticulosis, inflammation, polyps or cancers unless previously stated.  Retroflexed views revealed no abnormalities. The time to cecum=1 minutes 47 seconds.  Withdrawal time=8 minutes 58 seconds. The scope was withdrawn and the procedure completed. COMPLICATIONS: There were no complications.  ENDOSCOPIC IMPRESSION: 1.   Moderate diverticulosis in the sigmoid colon 2.   The colon was otherwise  normal  RECOMMENDATIONS: 1.  High fiber diet with liberal fluid intake. 2.  Repeat Colonoscopy in 5 years.  eSigned:  Meryl Dare, MD, [facility] 10/09/2013 3:28 PM

## 2013-10-09 NOTE — Progress Notes (Addendum)
Pt states he has post op nausea vomiting with sedation. ewm No egg or soy allergy. ewm

## 2013-10-09 NOTE — Progress Notes (Signed)
Report to pacu rn, vss, bbs=clear 

## 2013-10-09 NOTE — Progress Notes (Signed)
No complaints noted in the recovery room. Maw   

## 2013-10-09 NOTE — Patient Instructions (Signed)

## 2013-10-09 NOTE — Progress Notes (Signed)
The pt was very shy about passing flatus in front of his girl friend.  After 30 minutes, I got the pt in the restroom to pass the flatus.  He did not complain of any symptoms and his abdomen was soft.  Maw  Per the pt he did pass a good amount of gas.  Pt ready for discharge.  Maw

## 2013-10-10 ENCOUNTER — Telehealth: Payer: Self-pay

## 2013-10-10 NOTE — Telephone Encounter (Signed)
  Follow up Call-  Call back number 10/09/2013  Post procedure Call Back phone  # [phone] or [phone]  Permission to leave phone message Yes     Patient questions:  Do you have a fever, pain , or abdominal swelling? no Pain Score  0 *  Have you tolerated food without any problems? yes  Have you been able to return to your normal activities? yes  Do you have any questions about your discharge instructions: Diet   no Medications  no Follow up visit  no  Do you have questions or concerns about your Care? no  Actions: * If pain score is 4 or above: No action needed, pain <4.

## 2013-12-11 ENCOUNTER — Ambulatory Visit (INDEPENDENT_AMBULATORY_CARE_PROVIDER_SITE_OTHER): Payer: Medicare Other | Admitting: Internal Medicine

## 2013-12-11 ENCOUNTER — Encounter: Payer: Self-pay | Admitting: Internal Medicine

## 2013-12-11 VITALS — BP 128/72 | HR 76 | Temp 97.3°F | Ht 73.5 in | Wt 158.0 lb

## 2013-12-11 DIAGNOSIS — Z0000 Encounter for general adult medical examination without abnormal findings: Secondary | ICD-10-CM

## 2013-12-11 DIAGNOSIS — G4700 Insomnia, unspecified: Secondary | ICD-10-CM

## 2013-12-11 DIAGNOSIS — N400 Enlarged prostate without lower urinary tract symptoms: Secondary | ICD-10-CM

## 2013-12-11 DIAGNOSIS — N528 Other male erectile dysfunction: Secondary | ICD-10-CM

## 2013-12-11 DIAGNOSIS — N529 Male erectile dysfunction, unspecified: Secondary | ICD-10-CM

## 2013-12-11 DIAGNOSIS — Z13220 Encounter for screening for lipoid disorders: Secondary | ICD-10-CM

## 2013-12-11 DIAGNOSIS — F411 Generalized anxiety disorder: Secondary | ICD-10-CM

## 2013-12-11 DIAGNOSIS — Z130 Encounter for screening for diseases of the blood and blood-forming organs and certain disorders involving the immune mechanism: Secondary | ICD-10-CM

## 2013-12-11 DIAGNOSIS — E785 Hyperlipidemia, unspecified: Secondary | ICD-10-CM

## 2013-12-11 DIAGNOSIS — Z23 Encounter for immunization: Secondary | ICD-10-CM

## 2013-12-11 LAB — CBC WITH DIFFERENTIAL/PLATELET
Basophils Absolute: 0 10*3/uL (ref 0.0–0.1)
Basophils Relative: 1 % (ref 0–1)
Eosinophils Absolute: 0.1 10*3/uL (ref 0.0–0.7)
Eosinophils Relative: 2 % (ref 0–5)
HCT: 44.7 % (ref 39.0–52.0)
Hemoglobin: 15.1 g/dL (ref 13.0–17.0)
Lymphocytes Relative: 31 % (ref 12–46)
Lymphs Abs: 1.5 10*3/uL (ref 0.7–4.0)
MCH: 28.9 pg (ref 26.0–34.0)
MCHC: 33.8 g/dL (ref 30.0–36.0)
MCV: 85.6 fL (ref 78.0–100.0)
Monocytes Absolute: 0.5 10*3/uL (ref 0.1–1.0)
Monocytes Relative: 11 % (ref 3–12)
Neutro Abs: 2.7 10*3/uL (ref 1.7–7.7)
Neutrophils Relative %: 55 % (ref 43–77)
Platelets: 288 10*3/uL (ref 150–400)
RBC: 5.22 MIL/uL (ref 4.22–5.81)
RDW: 14.1 % (ref 11.5–15.5)
WBC: 4.8 10*3/uL (ref 4.0–10.5)

## 2013-12-11 LAB — POCT URINALYSIS DIPSTICK
Bilirubin, UA: NEGATIVE
Blood, UA: NEGATIVE
Glucose, UA: NEGATIVE
Ketones, UA: NEGATIVE
Leukocytes, UA: NEGATIVE
Nitrite, UA: NEGATIVE
Protein, UA: NEGATIVE
Spec Grav, UA: 1.015
Urobilinogen, UA: NEGATIVE
pH, UA: 6.5

## 2013-12-11 MED ORDER — ZOLPIDEM TARTRATE 10 MG PO TABS: 10.0000 mg | ORAL_TABLET | Freq: Every evening | ORAL | Status: DC | PRN

## 2013-12-11 MED ORDER — ALPRAZOLAM 0.5 MG PO TABS: 0.5000 mg | ORAL_TABLET | Freq: Two times a day (BID) | ORAL | Status: DC | PRN

## 2013-12-11 MED ORDER — TETANUS-DIPHTH-ACELL PERTUSSIS 5-2.5-18.5 LF-MCG/0.5 IM SUSP: 0.5000 mL | Freq: Once | INTRAMUSCULAR | Status: DC

## 2013-12-11 NOTE — Patient Instructions (Signed)
Take Ambien and Xanax sparingly. Tetanus toxoid given today.Cialis samples given. RTC one year.

## 2013-12-12 LAB — COMPREHENSIVE METABOLIC PANEL
ALT: 11 U/L (ref 0–53)
AST: 15 U/L (ref 0–37)
Albumin: 4.5 g/dL (ref 3.5–5.2)
Alkaline Phosphatase: 80 U/L (ref 39–117)
BUN: 15 mg/dL (ref 6–23)
CO2: 32 mEq/L (ref 19–32)
Calcium: 9.5 mg/dL (ref 8.4–10.5)
Chloride: 101 mEq/L (ref 96–112)
Creat: 0.78 mg/dL (ref 0.50–1.35)
Glucose, Bld: 89 mg/dL (ref 70–99)
Potassium: 4.2 mEq/L (ref 3.5–5.3)
Sodium: 141 mEq/L (ref 135–145)
Total Bilirubin: 0.5 mg/dL (ref 0.2–1.2)
Total Protein: 7.1 g/dL (ref 6.0–8.3)

## 2013-12-12 LAB — LIPID PANEL
Cholesterol: 233 mg/dL — ABNORMAL HIGH (ref 0–200)
HDL: 52 mg/dL (ref >39)
LDL Cholesterol: 159 mg/dL — ABNORMAL HIGH (ref 0–99)
Total CHOL/HDL Ratio: 4.5 Ratio
Triglycerides: 112 mg/dL (ref <150)
VLDL: 22 mg/dL (ref 0–40)

## 2014-05-31 ENCOUNTER — Encounter: Payer: Self-pay | Admitting: Internal Medicine

## 2014-05-31 NOTE — Progress Notes (Signed)
Subjective:    Patient ID: [name], male    DOB: 01/27/1941, 73 y.o.   MRN: [id number]  HPI 73 year old White Male with history of hyperlipidemia and BPH as well as anxiety, erectile dysfunction, and Peyronie's disease in today for health maintenance exam. Sometimes has insomnia. Sees Dr. Rosana Hoes for urology.  Past medical history: Tonsillectomy 1949, bilateral cataract extraction 1994, surgery for retinal detachment of the left eye by Dr. Zadie Rhine June 2001. Colonoscopy done March 2005. History of positional vertigo in the past with an episode of cervical radiculopathy. Negative exercise tolerance test 1999. Pneumovax immunization 2012.  Social history: He is a widower. Wife died of complications of COPD. Has a girlfriend. 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 Son was killed in a motor vehicle accident. Does not smoke.  Family history: Father died at age 14 of cancer. One brother in good health.    Review of Systems  Constitutional: Negative.   HENT: Negative.   Eyes:       History of bilateral cataract extraction and retinal attachment left 2001  Respiratory: Negative.   Cardiovascular: Negative.   Gastrointestinal: Negative.   Endocrine: Negative.   Genitourinary:       Erectile dysfunction and BPH followed by urologist  Psychiatric/Behavioral:       Anxiety and insomnia       Objective:   Physical Exam  Vitals reviewed. Constitutional: He appears well-developed and well-nourished. No distress.  HENT:  Head: Normocephalic and atraumatic.  Left Ear: External ear normal.  Mouth/Throat: Oropharynx is clear and moist. No oropharyngeal exudate.  Eyes: Conjunctivae and EOM are normal. Pupils are equal, round, and reactive to light. Right eye exhibits no discharge. No scleral icterus.  Neck: Neck supple. No JVD present. No thyromegaly present.  Cardiovascular: Normal rate, regular rhythm, normal heart sounds and intact distal  pulses.   No murmur heard. Pulmonary/Chest: Effort normal and breath sounds normal. No respiratory distress. He has no wheezes. He has no rales.  Abdominal: Soft. Bowel sounds are normal. He exhibits no distension and no mass. There is no tenderness. There is no rebound and no guarding.  Genitourinary:  Deferred to urologist  Musculoskeletal: Normal range of motion. He exhibits no edema.  Neurological: He is alert. He has normal reflexes. He displays normal reflexes. No cranial nerve deficit. Coordination normal.  Skin: Skin is warm and dry. No rash noted. He is not diaphoretic.  Psychiatric: He has a normal mood and affect. His behavior is normal. Judgment and thought content normal.          Assessment & Plan:  History of BPH followed by urologist  Peyronie's disease  Erectile dysfunction samples of Cialis given  Hyperlipidemia recommend diet exercise  History of anxiety treated sparingly with Xanax 0.5 mg  History of insomnia treated sparingly with Ambien 5 mg  Plan: Xanax 0.5 mg as needed for anxiety and Ambien 5 mg as needed for insomnia. Prescription given for Zostavax vaccine. Tetanus immunization given  Subjective:   Patient presents for Medicare Annual/Subsequent preventive examination.  Review Past Medical/Family/Social: see above   Risk Factors  Current exercise habits: hunting riding motorcycle Dietary issues discussed: low fat low carb  Cardiac risk factors: Hyperlipidemia  Depression Screen  (Note: if answer to either of the following is "Yes", a more complete depression screening is indicated)   Over the past two weeks, have you felt down, depressed or hopeless? No  Over the  past two weeks, have you felt little interest or pleasure in doing things? No Have you lost interest or pleasure in daily life? No Do you often feel hopeless? No Do you cry easily over simple problems? No   Activities of Daily Living  In your present state of health, do you have  any difficulty performing the following activities?:   Driving? No  Managing money? No  Feeding yourself? No  Getting from bed to chair? No  Climbing a flight of stairs? No  Preparing food and eating?: No  Bathing or showering? No  Getting dressed: No  Getting to the toilet? No  Using the toilet:No  Moving around from place to place: No  In the past year have you fallen or had a near fall?:No  Are you sexually active? No  Do you have more than one partner? No   Hearing Difficulties: No  Do you often ask people to speak up or repeat themselves? No  Do you experience ringing or noises in your ears? No  Do you have difficulty understanding soft or whispered voices? No  Do you feel that you have a problem with memory? No Do you often misplace items? No    Home Safety:  Do you have a smoke alarm at your residence? Yes Do you have grab bars in the bathroom? yes Do you have throw rugs in your house? no   Cognitive Testing  Alert? Yes Normal Appearance?Yes  Oriented to person? Yes Place? Yes  Time? Yes  Recall of three objects? Yes  Can perform simple calculations? Yes  Displays appropriate judgment?Yes  Can read the correct time from a watch face?Yes   List the Names of Other Physician/Practitioners you currently use:  See referral list for the physicians patient is currently seeing.   Dr. Lawerance Bach urology  Review of Systems: See above   Objective:     General appearance: Appears stated age. Head: Normocephalic, without obvious abnormality, atraumatic  Eyes: conj clear, EOMi PEERLA  Ears: normal TM's and external ear canals both ears  Nose: Nares normal. Septum midline. Mucosa normal. No drainage or sinus tenderness.  Throat: lips, mucosa, and tongue normal; teeth and gums normal  Neck: no adenopathy, no carotid bruit, no JVD, supple, symmetrical, trachea midline and thyroid not enlarged, symmetric, no tenderness/mass/nodules  No CVA tenderness.  Lungs: clear to  auscultation bilaterally  Breasts: normal appearance, no masses or tenderness Heart: regular rate and rhythm, S1, S2 normal, no murmur, click, rub or gallop  Abdomen: soft, non-tender; bowel sounds normal; no masses, no organomegaly  Musculoskeletal: ROM normal in all joints, no crepitus, no deformity, Normal muscle strengthen. Back  is symmetric, no curvature. Skin: Skin color, texture, turgor normal. No rashes or lesions  Lymph nodes: Cervical, supraclavicular, and axillary nodes normal.  Neurologic: CN 2 -12 Normal, Normal symmetric reflexes. Normal coordination and gait  Psych: Alert & Oriented x 3, Mood appear stable.    Assessment:    Annual wellness medicare exam   Plan:    During the course of the visit the patient was educated and counseled about appropriate screening and preventive services including:   Annual PSA     Patient Instructions (the written plan) was given to the patient.  Medicare Attestation  I have personally reviewed:  The patient's medical and social history  Their use of alcohol, tobacco or illicit drugs  Their current medications and supplements  The patient's functional ability including ADLs,fall risks, home safety risks, cognitive, and  hearing and visual impairment  Diet and physical activities  Evidence for depression or mood disorders  The patient's weight, height, BMI, and visual acuity have been recorded in the chart. I have made referrals, counseling, and provided education to the patient based on review of the above and I have provided the patient with a written personalized care plan for preventive services.       Plan: Return in one year or as needed. Continue diet exercise for hyperlipidemia. Continue to be followed by urologist.

## 2014-09-14 ENCOUNTER — Ambulatory Visit (INDEPENDENT_AMBULATORY_CARE_PROVIDER_SITE_OTHER): Payer: Medicare Other | Admitting: Internal Medicine

## 2014-09-14 DIAGNOSIS — Z23 Encounter for immunization: Secondary | ICD-10-CM

## 2014-11-16 DIAGNOSIS — N138 Other obstructive and reflux uropathy: Secondary | ICD-10-CM | POA: Insufficient documentation

## 2014-11-16 DIAGNOSIS — N401 Enlarged prostate with lower urinary tract symptoms: Secondary | ICD-10-CM

## 2014-12-07 ENCOUNTER — Other Ambulatory Visit: Payer: Self-pay | Admitting: Internal Medicine

## 2014-12-07 DIAGNOSIS — Z0000 Encounter for general adult medical examination without abnormal findings: Secondary | ICD-10-CM

## 2014-12-07 DIAGNOSIS — N400 Enlarged prostate without lower urinary tract symptoms: Secondary | ICD-10-CM

## 2014-12-07 DIAGNOSIS — E785 Hyperlipidemia, unspecified: Secondary | ICD-10-CM

## 2014-12-07 LAB — CBC WITH DIFFERENTIAL/PLATELET
Basophils Absolute: 0 10*3/uL (ref 0.0–0.1)
Basophils Relative: 1 % (ref 0–1)
Eosinophils Absolute: 0.1 10*3/uL (ref 0.0–0.7)
Eosinophils Relative: 3 % (ref 0–5)
HCT: 44.9 % (ref 39.0–52.0)
Hemoglobin: 15 g/dL (ref 13.0–17.0)
Lymphocytes Relative: 31 % (ref 12–46)
Lymphs Abs: 1.4 10*3/uL (ref 0.7–4.0)
MCH: 29.5 pg (ref 26.0–34.0)
MCHC: 33.4 g/dL (ref 30.0–36.0)
MCV: 88.4 fL (ref 78.0–100.0)
MPV: 10 fL (ref 8.6–12.4)
Monocytes Absolute: 0.6 10*3/uL (ref 0.1–1.0)
Monocytes Relative: 14 % — ABNORMAL HIGH (ref 3–12)
Neutro Abs: 2.2 10*3/uL (ref 1.7–7.7)
Neutrophils Relative %: 51 % (ref 43–77)
Platelets: 281 10*3/uL (ref 150–400)
RBC: 5.08 MIL/uL (ref 4.22–5.81)
RDW: 13.9 % (ref 11.5–15.5)
WBC: 4.4 10*3/uL (ref 4.0–10.5)

## 2014-12-07 LAB — COMPREHENSIVE METABOLIC PANEL
ALT: 14 U/L (ref 0–53)
AST: 18 U/L (ref 0–37)
Albumin: 4.2 g/dL (ref 3.5–5.2)
Alkaline Phosphatase: 97 U/L (ref 39–117)
BUN: 16 mg/dL (ref 6–23)
CO2: 30 mEq/L (ref 19–32)
Calcium: 9.4 mg/dL (ref 8.4–10.5)
Chloride: 103 mEq/L (ref 96–112)
Creat: 0.79 mg/dL (ref 0.50–1.35)
Glucose, Bld: 83 mg/dL (ref 70–99)
Potassium: 4.6 mEq/L (ref 3.5–5.3)
Sodium: 141 mEq/L (ref 135–145)
Total Bilirubin: 0.6 mg/dL (ref 0.2–1.2)
Total Protein: 7 g/dL (ref 6.0–8.3)

## 2014-12-07 LAB — LIPID PANEL
Cholesterol: 239 mg/dL — ABNORMAL HIGH (ref 0–200)
HDL: 53 mg/dL (ref >39)
LDL Cholesterol: 161 mg/dL — ABNORMAL HIGH (ref 0–99)
Total CHOL/HDL Ratio: 4.5 Ratio
Triglycerides: 123 mg/dL (ref <150)
VLDL: 25 mg/dL (ref 0–40)

## 2014-12-08 LAB — PSA, MEDICARE: PSA: 4.37 ng/mL — ABNORMAL HIGH (ref <=4.00)

## 2014-12-10 ENCOUNTER — Encounter: Payer: Medicare Other | Admitting: Internal Medicine

## 2014-12-21 ENCOUNTER — Encounter: Payer: Self-pay | Admitting: Internal Medicine

## 2015-01-05 ENCOUNTER — Encounter: Payer: Self-pay | Admitting: Internal Medicine

## 2015-01-05 ENCOUNTER — Ambulatory Visit (INDEPENDENT_AMBULATORY_CARE_PROVIDER_SITE_OTHER): Payer: Medicare Other | Admitting: Internal Medicine

## 2015-01-05 VITALS — BP 128/82 | HR 89 | Temp 97.3°F | Wt 161.0 lb

## 2015-01-05 DIAGNOSIS — N486 Induration penis plastica: Secondary | ICD-10-CM | POA: Diagnosis not present

## 2015-01-05 DIAGNOSIS — G4700 Insomnia, unspecified: Secondary | ICD-10-CM

## 2015-01-05 DIAGNOSIS — Z0000 Encounter for general adult medical examination without abnormal findings: Secondary | ICD-10-CM | POA: Diagnosis not present

## 2015-01-05 DIAGNOSIS — N529 Male erectile dysfunction, unspecified: Secondary | ICD-10-CM | POA: Diagnosis not present

## 2015-01-05 DIAGNOSIS — K219 Gastro-esophageal reflux disease without esophagitis: Secondary | ICD-10-CM

## 2015-01-05 DIAGNOSIS — F411 Generalized anxiety disorder: Secondary | ICD-10-CM

## 2015-01-05 LAB — POCT URINALYSIS DIPSTICK
Bilirubin, UA: NEGATIVE
Blood, UA: NEGATIVE
Glucose, UA: NEGATIVE
Ketones, UA: NEGATIVE
Leukocytes, UA: NEGATIVE
Nitrite, UA: NEGATIVE
Protein, UA: NEGATIVE
Spec Grav, UA: <=1.005
Urobilinogen, UA: NEGATIVE
pH, UA: 7

## 2015-01-08 ENCOUNTER — Other Ambulatory Visit: Payer: Medicare Other | Admitting: Internal Medicine

## 2015-01-08 DIAGNOSIS — G629 Polyneuropathy, unspecified: Secondary | ICD-10-CM

## 2015-01-09 LAB — TSH: TSH: 1.642 u[IU]/mL (ref 0.350–4.500)

## 2015-01-09 LAB — VITAMIN B12: Vitamin B-12: 415 pg/mL (ref 211–911)

## 2015-01-11 ENCOUNTER — Telehealth: Payer: Self-pay | Admitting: *Deleted

## 2015-01-11 NOTE — Telephone Encounter (Signed)
Reviewed lab results with patient.

## 2015-01-29 NOTE — Progress Notes (Signed)
Subjective:    Patient ID: [name], male    DOB: [birth year], 74 y.o.   MRN: [id number]  HPI 74 year old white male-will be 74 years old on March 3 in today for health maintenance exam and evaluation of medical issues. He has a history of hyperlipidemia, BPH, anxiety, erectile dysfunction and Peyronie's disease. Sees Dr. Rosana Hoes for urology who has been prescribed him some generic Viagra.  Past medical history: Tonsillectomy 1949, bilateral cataract extraction 1994, surgery for retinal detachment of the left eye by Dr. Zadie Rhine June 2001. Colonoscopy done March 2005. History of positional vertigo in the past with an episode of cervical radiculopathy. Negative exercise tolerance test 1999. Pneumovax immunization 2012.  Social history: He is a widower. Wife died of complications of COPD. He has a girlfriend and is sexually active. 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 Son was killed in a motor vehicle accident. Does not smoke.  Family history: Father died at age 28 of cancer. One brother in good health.    Review of Systems history of bilateral cataract extraction and retinal detachment left eye 2001     Objective:   Physical Exam  Constitutional: He is oriented to person, place, and time. He appears well-developed and well-nourished. No distress.  HENT:  Head: Normocephalic and atraumatic.  Right Ear: External ear normal.  Left Ear: External ear normal.  Mouth/Throat: Oropharynx is clear and moist. No oropharyngeal exudate.  Eyes: Conjunctivae are normal. Pupils are equal, round, and reactive to light. Right eye exhibits no discharge. Left eye exhibits no discharge. No scleral icterus.  Neck: Neck supple. No JVD present. No thyromegaly present.  Cardiovascular: Normal rate, regular rhythm and normal heart sounds.   No murmur heard. Pulmonary/Chest: Effort normal and breath sounds normal. No respiratory distress. He has no wheezes. He  has no rales. He exhibits no tenderness.  Abdominal: Bowel sounds are normal. He exhibits no distension and no mass. There is no rebound and no guarding.  Genitourinary:  Prostate enlarged without nodules  Musculoskeletal: Normal range of motion. He exhibits no edema.  Lymphadenopathy:    He has no cervical adenopathy.  Neurological: He is alert and oriented to person, place, and time. He has normal reflexes. No cranial nerve deficit. Coordination normal.  Skin: Skin is warm and dry. No rash noted. He is not diaphoretic.  Psychiatric: He has a normal mood and affect. His behavior is normal. Judgment and thought content normal.  Vitals reviewed.         Assessment & Plan:  Normal health maintenance exam  History of anxiety  History of hyperlipidemia and-treated with diet alone  History of BPH  History of erectile dysfunction-treated with Dr. Rosana Hoes  History of Peyronie's disease  Plan: Return in one year or as needed. Continue same medications. Okay to refill Xanax and Ambien for 6 months.  Subjective:   Patient presents for Medicare Annual/Subsequent preventive examination.  Review Past Medical/Family/Social: See above   Risk Factors  Current exercise habits: He rides motorcycles and flies drones Dietary issues discussed: Low fat low carb-patient is not overweight  Cardiac risk factors: Hyperlipidemia  Depression Screen  (Note: if answer to either of the following is "Yes", a more complete depression screening is indicated)   Over the past two weeks, have you felt down, depressed or hopeless? No  Over the past two weeks, have you felt little interest or pleasure in doing things? No Have you lost interest  or pleasure in daily life? No Do you often feel hopeless? No Do you cry easily over simple problems? No   Activities of Daily Living  In your present state of health, do you have any difficulty performing the following activities?:   Driving? No  Managing  money? No  Feeding yourself? No  Getting from bed to chair? No  Climbing a flight of stairs? No  Preparing food and eating?: No  Bathing or showering? No  Getting dressed: No  Getting to the toilet? No  Using the toilet:No  Moving around from place to place: No  In the past year have you fallen or had a near fall?:No  Are you sexually active? Sometimes Do you have more than one partner? No   Hearing Difficulties: No  Do you often ask people to speak up or repeat themselves? No  Do you experience ringing or noises in your ears? No  Do you have difficulty understanding soft or whispered voices? No  Do you feel that you have a problem with memory? No Do you often misplace items? No    Home Safety:  Do you have a smoke alarm at your residence? Yes Do you have grab bars in the bathroom? No Do you have throw rugs in your house? No   Cognitive Testing  Alert? Yes Normal Appearance?Yes  Oriented to person? Yes Place? Yes  Time? Yes  Recall of three objects? Yes  Can perform simple calculations? Yes  Displays appropriate judgment?Yes  Can read the correct time from a watch face?Yes   List the Names of Other Physician/Practitioners you currently use:  See referral list for the physicians patient is currently seeing.     Review of Systems: See above   Objective:     General appearance: Appears stated age  Head: Normocephalic, without obvious abnormality, atraumatic  Eyes: conj clear, EOMi PEERLA  Ears: normal TM's and external ear canals both ears  Nose: Nares normal. Septum midline. Mucosa normal. No drainage or sinus tenderness.  Throat: lips, mucosa, and tongue normal; teeth and gums normal  Neck: no adenopathy, no carotid bruit, no JVD, supple, symmetrical, trachea midline and thyroid not enlarged, symmetric, no tenderness/mass/nodules  No CVA tenderness.  Lungs: clear to auscultation bilaterally  Breasts: normal appearance, no masses or tenderness Heart: regular  rate and rhythm, S1, S2 normal, no murmur, click, rub or gallop  Abdomen: soft, non-tender; bowel sounds normal; no masses, no organomegaly  Musculoskeletal: ROM normal in all joints, no crepitus, no deformity, Normal muscle strengthen. Back  is symmetric, no curvature. Skin: Skin color, texture, turgor normal. No rashes or lesions  Lymph nodes: Cervical, supraclavicular, and axillary nodes normal.  Neurologic: CN 2 -12 Normal, Normal symmetric reflexes. Normal coordination and gait  Psych: Alert & Oriented x 3, Mood appear stable.    Assessment:    Annual wellness medicare exam   Plan:    During the course of the visit the patient was educated and counseled about appropriate screening and preventive services including:   Subjective:   Patient presents for Medicare Annual/Subsequent preventive examination.  Review Past Medical/Family/Social: See above   Risk Factors  Current exercise habits: Rides motorcycles and flies drones Dietary issues discussed: Low fat low carb. He is not overweight  Cardiac risk factors: Hyperlipidemia  Depression Screen  (Note: if answer to either of the following is "Yes", a more complete depression screening is indicated)   Over the past two weeks, have you felt down, depressed or hopeless?  No  Over the past two weeks, have you felt little interest or pleasure in doing things? No Have you lost interest or pleasure in daily life? No Do you often feel hopeless? No Do you cry easily over simple problems? No   Activities of Daily Living  In your present state of health, do you have any difficulty performing the following activities?:   Driving? No  Managing money? No  Feeding yourself? No  Getting from bed to chair? No  Climbing a flight of stairs? No  Preparing food and eating?: No  Bathing or showering? No  Getting dressed: No  Getting to the toilet? No  Using the toilet:No  Moving around from place to place: No  In the past year have you  fallen or had a near fall?:No  Are you sexually active? No  Do you have more than one partner? No   Hearing Difficulties: No  Do you often ask people to speak up or repeat themselves? No  Do you experience ringing or noises in your ears? No  Do you have difficulty understanding soft or whispered voices? No  Do you feel that you have a problem with memory? No Do you often misplace items? No    Home Safety:  Do you have a smoke alarm at your residence? Yes Do you have grab bars in the bathroom? No Do you have throw rugs in your house? No   Cognitive Testing  Alert? Yes Normal Appearance?Yes  Oriented to person? Yes Place? Yes  Time? Yes  Recall of three objects? Yes  Can perform simple calculations? Yes  Displays appropriate judgment?Yes  Can read the correct time from a watch face?Yes   List the Names of Other Physician/Practitioners you currently use:  See referral list for the physicians patient is currently seeing.  Dr. Lawerance Bach urologist   Review of Systems: See above   Objective:     General appearance: Appears stated age. Head: Normocephalic, without obvious abnormality, atraumatic  Eyes: conj clear, EOMi PEERLA  Ears: normal TM's and external ear canals both ears  Nose: Nares normal. Septum midline. Mucosa normal. No drainage or sinus tenderness.  Throat: lips, mucosa, and tongue normal; teeth and gums normal  Neck: no adenopathy, no carotid bruit, no JVD, supple, symmetrical, trachea midline and thyroid not enlarged, symmetric, no tenderness/mass/nodules  No CVA tenderness.  Lungs: clear to auscultation bilaterally  Breasts: normal appearance, no masses or tenderness Heart: regular rate and rhythm, S1, S2 normal, no murmur, click, rub or gallop  Abdomen: soft, non-tender; bowel sounds normal; no masses, no organomegaly  Musculoskeletal: ROM normal in all joints, no crepitus, no deformity, Normal muscle strengthen. Back  is symmetric, no curvature. Skin: Skin  color, texture, turgor normal. No rashes or lesions  Lymph nodes: Cervical, supraclavicular, and axillary nodes normal.  Neurologic: CN 2 -12 Normal, Normal symmetric reflexes. Normal coordination and gait  Psych: Alert & Oriented x 3, Mood appear stable.    Assessment:    Annual wellness medicare exam   Plan:    Continue current medications and return in one year.  Medicare attestation:  I have personally reviewed the patient's medical and social history, use of alcohol tobacco and illicit drugs, Patient's current medications and supplements, as well as Patient's functional ability including ADLs, fall risks, home safety risk, cognitive and hearing and visual impairment. Diet and physical activities. No evidence for depression or mood disorders.

## 2015-01-30 DIAGNOSIS — G4700 Insomnia, unspecified: Secondary | ICD-10-CM | POA: Insufficient documentation

## 2015-01-30 DIAGNOSIS — K219 Gastro-esophageal reflux disease without esophagitis: Secondary | ICD-10-CM | POA: Insufficient documentation

## 2015-01-30 NOTE — Patient Instructions (Signed)
Continue same medications and return in one year. It was a pleasure to see you today.

## 2015-09-02 ENCOUNTER — Ambulatory Visit (INDEPENDENT_AMBULATORY_CARE_PROVIDER_SITE_OTHER): Payer: Medicare Other | Admitting: Internal Medicine

## 2015-09-02 ENCOUNTER — Encounter: Payer: Self-pay | Admitting: Internal Medicine

## 2015-09-02 VITALS — BP 128/68 | Wt 161.0 lb

## 2015-09-02 DIAGNOSIS — Z23 Encounter for immunization: Secondary | ICD-10-CM

## 2015-11-29 DIAGNOSIS — R972 Elevated prostate specific antigen [PSA]: Secondary | ICD-10-CM | POA: Diagnosis not present

## 2015-11-29 DIAGNOSIS — N401 Enlarged prostate with lower urinary tract symptoms: Secondary | ICD-10-CM | POA: Diagnosis not present

## 2015-11-29 DIAGNOSIS — N529 Male erectile dysfunction, unspecified: Secondary | ICD-10-CM | POA: Diagnosis not present

## 2016-01-10 DIAGNOSIS — M9905 Segmental and somatic dysfunction of pelvic region: Secondary | ICD-10-CM | POA: Diagnosis not present

## 2016-01-10 DIAGNOSIS — M9904 Segmental and somatic dysfunction of sacral region: Secondary | ICD-10-CM | POA: Diagnosis not present

## 2016-01-10 DIAGNOSIS — M5137 Other intervertebral disc degeneration, lumbosacral region: Secondary | ICD-10-CM | POA: Diagnosis not present

## 2016-01-10 DIAGNOSIS — M5431 Sciatica, right side: Secondary | ICD-10-CM | POA: Diagnosis not present

## 2016-01-10 DIAGNOSIS — M9903 Segmental and somatic dysfunction of lumbar region: Secondary | ICD-10-CM | POA: Diagnosis not present

## 2016-01-12 DIAGNOSIS — M5137 Other intervertebral disc degeneration, lumbosacral region: Secondary | ICD-10-CM | POA: Diagnosis not present

## 2016-01-12 DIAGNOSIS — M5431 Sciatica, right side: Secondary | ICD-10-CM | POA: Diagnosis not present

## 2016-01-12 DIAGNOSIS — M9905 Segmental and somatic dysfunction of pelvic region: Secondary | ICD-10-CM | POA: Diagnosis not present

## 2016-01-12 DIAGNOSIS — M9904 Segmental and somatic dysfunction of sacral region: Secondary | ICD-10-CM | POA: Diagnosis not present

## 2016-01-12 DIAGNOSIS — M9903 Segmental and somatic dysfunction of lumbar region: Secondary | ICD-10-CM | POA: Diagnosis not present

## 2016-01-13 DIAGNOSIS — M5431 Sciatica, right side: Secondary | ICD-10-CM | POA: Diagnosis not present

## 2016-01-13 DIAGNOSIS — M9905 Segmental and somatic dysfunction of pelvic region: Secondary | ICD-10-CM | POA: Diagnosis not present

## 2016-01-13 DIAGNOSIS — M9904 Segmental and somatic dysfunction of sacral region: Secondary | ICD-10-CM | POA: Diagnosis not present

## 2016-01-13 DIAGNOSIS — M9903 Segmental and somatic dysfunction of lumbar region: Secondary | ICD-10-CM | POA: Diagnosis not present

## 2016-01-13 DIAGNOSIS — M5137 Other intervertebral disc degeneration, lumbosacral region: Secondary | ICD-10-CM | POA: Diagnosis not present

## 2016-01-17 ENCOUNTER — Ambulatory Visit (INDEPENDENT_AMBULATORY_CARE_PROVIDER_SITE_OTHER): Payer: PPO | Admitting: Internal Medicine

## 2016-01-17 ENCOUNTER — Encounter: Payer: Self-pay | Admitting: Internal Medicine

## 2016-01-17 VITALS — BP 140/80 | HR 84 | Temp 97.1°F | Resp 20 | Ht 74.0 in | Wt 163.5 lb

## 2016-01-17 DIAGNOSIS — IMO0001 Reserved for inherently not codable concepts without codable children: Secondary | ICD-10-CM

## 2016-01-17 DIAGNOSIS — Z130 Encounter for screening for diseases of the blood and blood-forming organs and certain disorders involving the immune mechanism: Secondary | ICD-10-CM

## 2016-01-17 DIAGNOSIS — N400 Enlarged prostate without lower urinary tract symptoms: Secondary | ICD-10-CM

## 2016-01-17 DIAGNOSIS — Z0000 Encounter for general adult medical examination without abnormal findings: Secondary | ICD-10-CM | POA: Diagnosis not present

## 2016-01-17 DIAGNOSIS — F411 Generalized anxiety disorder: Secondary | ICD-10-CM | POA: Diagnosis not present

## 2016-01-17 DIAGNOSIS — E785 Hyperlipidemia, unspecified: Secondary | ICD-10-CM

## 2016-01-17 DIAGNOSIS — R030 Elevated blood-pressure reading, without diagnosis of hypertension: Secondary | ICD-10-CM | POA: Diagnosis not present

## 2016-01-17 LAB — LIPID PANEL
Cholesterol: 237 mg/dL — ABNORMAL HIGH (ref 125–200)
HDL: 44 mg/dL (ref >=40)
LDL Cholesterol: 163 mg/dL — ABNORMAL HIGH (ref <130)
Total CHOL/HDL Ratio: 5.4 Ratio — ABNORMAL HIGH (ref <=5.0)
Triglycerides: 148 mg/dL (ref <150)
VLDL: 30 mg/dL (ref <30)

## 2016-01-17 LAB — CBC WITH DIFFERENTIAL/PLATELET
Basophils Absolute: 0 10*3/uL (ref 0.0–0.1)
Basophils Relative: 1 % (ref 0–1)
Eosinophils Absolute: 0.2 10*3/uL (ref 0.0–0.7)
Eosinophils Relative: 4 % (ref 0–5)
HCT: 44.1 % (ref 39.0–52.0)
Hemoglobin: 14.7 g/dL (ref 13.0–17.0)
Lymphocytes Relative: 32 % (ref 12–46)
Lymphs Abs: 1.5 10*3/uL (ref 0.7–4.0)
MCH: 29.8 pg (ref 26.0–34.0)
MCHC: 33.3 g/dL (ref 30.0–36.0)
MCV: 89.3 fL (ref 78.0–100.0)
MPV: 9.9 fL (ref 8.6–12.4)
Monocytes Absolute: 0.6 10*3/uL (ref 0.1–1.0)
Monocytes Relative: 12 % (ref 3–12)
Neutro Abs: 2.4 10*3/uL (ref 1.7–7.7)
Neutrophils Relative %: 51 % (ref 43–77)
Platelets: 319 10*3/uL (ref 150–400)
RBC: 4.94 MIL/uL (ref 4.22–5.81)
RDW: 13.6 % (ref 11.5–15.5)
WBC: 4.8 10*3/uL (ref 4.0–10.5)

## 2016-01-17 LAB — COMPLETE METABOLIC PANEL WITH GFR
ALT: 15 U/L (ref 9–46)
AST: 20 U/L (ref 10–35)
Albumin: 4 g/dL (ref 3.6–5.1)
Alkaline Phosphatase: 93 U/L (ref 40–115)
BUN: 19 mg/dL (ref 7–25)
CO2: 30 mmol/L (ref 20–31)
Calcium: 9.4 mg/dL (ref 8.6–10.3)
Chloride: 104 mmol/L (ref 98–110)
Creat: 0.81 mg/dL (ref 0.70–1.18)
GFR, Est African American: >89 mL/min (ref >=60)
GFR, Est Non African American: 88 mL/min (ref >=60)
Glucose, Bld: 89 mg/dL (ref 65–99)
Potassium: 4.7 mmol/L (ref 3.5–5.3)
Sodium: 142 mmol/L (ref 135–146)
Total Bilirubin: 0.6 mg/dL (ref 0.2–1.2)
Total Protein: 6.5 g/dL (ref 6.1–8.1)

## 2016-01-17 NOTE — Progress Notes (Signed)
Subjective:    Patient ID: [name], male    DOB: 11/11/1940, 75 y.o.   MRN: [id number]  HPI 76 year old White Male in today for health maintenance exam and evaluation of medical issues. He has a history of office hypertension. Blood pressure is slightly elevated today. Says it runs no higher than [id number] systolically at home. He does keep a check on it. He should call us in 2 weeks with some home blood pressure readings. He has a history of generalized anxiety disorder for which she takes Xanax sparingly. Sometimes has insomnia. General health is good and he has no new complaints.  Declines Prevnar vaccine. Has had Zostavax vaccine through local pharmacy.  He has a history of hyperlipidemia which is been diet-controlled. History of BPH followed by Dr. Rosana Hoes. History of erectile dysfunction treated by Dr. Rosana Hoes. History of Peyronie's disease. History of anxiety treated sparingly with Xanax.  Family history: Father died at age 74 of cancer. One brother in good health.  Social history: He is a widower. Wife died of, patient of COPD. He has a lady friend and is not sexually active. 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 Son was killed in a motor vehicle accident. Does not smoke. No other children.  Past medical history: Tonsillectomy 1949, bilateral cataract extraction 1994, surgery for retinal detachment of the left eye by Dr. Zadie Rhine June 2001. Colonoscopy done March 2005. History of positional vertigo in the past with an episode of cervical radiculopathy. Negative exercise tolerance test 1999. Pneumovax 23 in 2012. Tetanus immunization 2015.        Review of Systems  Constitutional: Negative.   HENT: Negative.   Eyes: Negative.   Respiratory: Negative.   Cardiovascular: Negative.   Genitourinary:       History of erectile dysfunction, Peyronie's disease and BPH followed by urologist  Neurological: Negative.   Hematological: Negative.         Objective:   Physical Exam  Constitutional: He is oriented to person, place, and time. He appears well-developed and well-nourished. No distress.  HENT:  Head: Normocephalic and atraumatic.  Right Ear: External ear normal.  Left Ear: External ear normal.  Mouth/Throat: Oropharynx is clear and moist.  Eyes: Conjunctivae and EOM are normal. Pupils are equal, round, and reactive to light. Right eye exhibits no discharge. Left eye exhibits no discharge. No scleral icterus.  Neck: Neck supple. No JVD present. No thyromegaly present.  Cardiovascular: Normal rate, regular rhythm, normal heart sounds and intact distal pulses.   No murmur heard. Pulmonary/Chest: Effort normal and breath sounds normal. No respiratory distress. He has no wheezes. He has no rales.  Abdominal: Soft. Bowel sounds are normal. He exhibits no distension and no mass. There is no tenderness. There is no rebound and no guarding.  Genitourinary:  Deferred to urologist  Musculoskeletal: He exhibits no edema.  Lymphadenopathy:    He has no cervical adenopathy.  Neurological: He is alert and oriented to person, place, and time. He has normal reflexes. No cranial nerve deficit. Coordination normal.  Skin: Skin is warm and dry. No rash noted. He is not diaphoretic.  Psychiatric: He has a normal mood and affect. His behavior is normal. Judgment and thought content normal.  Vitals reviewed.         Assessment & Plan:  Normal health maintenance exam  Peyronie's disease-Seen by urologist  BPH-Seen by urologist. PSA drawn today.  Erectile dysfunction-seen by urologist  Office  hypertension-to call with blood pressure readings in a few weeks.  History of anxiety-has Xanax on hand to take sparingly  History of hyperlipidemia-fasting labs drawn and pending including fasting lipid panel   Plan: Continue same medications and return in one year or as needed. Declines Pneumovax 13. Other immunizations are up-to-date.  Follow-up with urologist regarding BPH  Subjective:   Patient presents for Medicare Annual/Subsequent preventive examination.  Review Past Medical/Family/Social:See above   Risk Factors  Current exercise habits: Fairly active about his house and yard Dietary issues discussed: Low fat low carbohydrate. He is not overweight.  Cardiac risk factors:Hyperlipidemia  Depression Screen  (Note: if answer to either of the following is "Yes", a more complete depression screening is indicated)   Over the past two weeks, have you felt down, depressed or hopeless? No  Over the past two weeks, have you felt little interest or pleasure in doing things? No Have you lost interest or pleasure in daily life? No Do you often feel hopeless? No Do you cry easily over simple problems? No   Activities of Daily Living  In your present state of health, do you have any difficulty performing the following activities?:   Driving? No  Managing money? No  Feeding yourself? No  Getting from bed to chair? No  Climbing a flight of stairs? No  Preparing food and eating?: No  Bathing or showering? No  Getting dressed: No  Getting to the toilet? No  Using the toilet:No  Moving around from place to place: No  In the past year have you fallen or had a near fall?:No  Are you sexually active? No  Do you have more than one partner? No   Hearing Difficulties: No  Do you often ask people to speak up or repeat themselves? No  Do you experience ringing or noises in your ears? No  Do you have difficulty understanding soft or whispered voices? No  Do you feel that you have a problem with memory? No Do you often misplace items? No    Home Safety:  Do you have a smoke alarm at your residence? Yes Do you have grab bars in the bathroom?No Do you have throw rugs in your house? No   Cognitive Testing  Alert? Yes Normal Appearance?Yes  Oriented to person? Yes Place? Yes  Time? Yes  Recall of three objects? Yes    Can perform simple calculations? Yes  Displays appropriate judgment?Yes  Can read the correct time from a watch face?Yes   List the Names of Other Physician/Practitioners you currently use:  See referral list for the physicians patient is currently seeing.  Urologist for BPH   Review of Systems: See above   Objective:     General appearance: Appears stated age and Thin. Head: Normocephalic, without obvious abnormality, atraumatic  Eyes: conj clear, EOMi PEERLA  Ears: normal TM's and external ear canals both ears  Nose: Nares normal. Septum midline. Mucosa normal. No drainage or sinus tenderness.  Throat: lips, mucosa, and tongue normal; teeth and gums normal  Neck: no adenopathy, no carotid bruit, no JVD, supple, symmetrical, trachea midline and thyroid not enlarged, symmetric, no tenderness/mass/nodules  No CVA tenderness.  Lungs: clear to auscultation bilaterally  Breasts: normal appearance, no masses or tenderness Heart: regular rate and rhythm, S1, S2 normal, no murmur, click, rub or gallop  Abdomen: soft, non-tender; bowel sounds normal; no masses, no organomegaly  Musculoskeletal: ROM normal in all joints, no crepitus, no deformity, Normal muscle strengthen. Back  is symmetric, no curvature. Skin: Skin color, texture, turgor normal. No rashes or lesions  Lymph nodes: Cervical, supraclavicular, and axillary nodes normal.  Neurologic: CN 2 -12 Normal, Normal symmetric reflexes. Normal coordination and gait  Psych: Alert & Oriented x 3, Mood appear stable.    Assessment:    Annual wellness medicare exam   Plan:    During the course of the visit the patient was educated and counseled about appropriate screening and preventive services including:  Annual PSA  Prevnar 13-declined      Patient Instructions (the written plan) was given to the patient.  Medicare Attestation  I have personally reviewed:  The patient's medical and social history  Their use of  alcohol, tobacco or illicit drugs  Their current medications and supplements  The patient's functional ability including ADLs,fall risks, home safety risks, cognitive, and hearing and visual impairment  Diet and physical activities  Evidence for depression or mood disorders  The patient's weight, height, BMI, and visual acuity have been recorded in the chart. I have made referrals, counseling, and provided education to the patient based on review of the above and I have provided the patient with a written personalized care plan for preventive services.

## 2016-01-17 NOTE — Patient Instructions (Signed)
Keep eye on blood pressure and call with results in the next 2 weeks. Would like blood pressure to run between 125 and 135 most of the time.

## 2016-01-18 DIAGNOSIS — M5137 Other intervertebral disc degeneration, lumbosacral region: Secondary | ICD-10-CM | POA: Diagnosis not present

## 2016-01-18 DIAGNOSIS — M9905 Segmental and somatic dysfunction of pelvic region: Secondary | ICD-10-CM | POA: Diagnosis not present

## 2016-01-18 DIAGNOSIS — M5431 Sciatica, right side: Secondary | ICD-10-CM | POA: Diagnosis not present

## 2016-01-18 DIAGNOSIS — M9904 Segmental and somatic dysfunction of sacral region: Secondary | ICD-10-CM | POA: Diagnosis not present

## 2016-01-18 DIAGNOSIS — M9903 Segmental and somatic dysfunction of lumbar region: Secondary | ICD-10-CM | POA: Diagnosis not present

## 2016-01-18 LAB — PSA: PSA: 6.4 ng/mL — ABNORMAL HIGH (ref <=4.00)

## 2016-01-19 DIAGNOSIS — M9903 Segmental and somatic dysfunction of lumbar region: Secondary | ICD-10-CM | POA: Diagnosis not present

## 2016-01-19 DIAGNOSIS — M9904 Segmental and somatic dysfunction of sacral region: Secondary | ICD-10-CM | POA: Diagnosis not present

## 2016-01-19 DIAGNOSIS — M9905 Segmental and somatic dysfunction of pelvic region: Secondary | ICD-10-CM | POA: Diagnosis not present

## 2016-01-19 DIAGNOSIS — M5431 Sciatica, right side: Secondary | ICD-10-CM | POA: Diagnosis not present

## 2016-01-19 DIAGNOSIS — M5137 Other intervertebral disc degeneration, lumbosacral region: Secondary | ICD-10-CM | POA: Diagnosis not present

## 2016-01-20 DIAGNOSIS — H33053 Total retinal detachment, bilateral: Secondary | ICD-10-CM | POA: Diagnosis not present

## 2016-01-20 DIAGNOSIS — H35372 Puckering of macula, left eye: Secondary | ICD-10-CM | POA: Diagnosis not present

## 2016-01-20 DIAGNOSIS — H26492 Other secondary cataract, left eye: Secondary | ICD-10-CM | POA: Diagnosis not present

## 2016-01-20 DIAGNOSIS — H35351 Cystoid macular degeneration, right eye: Secondary | ICD-10-CM | POA: Diagnosis not present

## 2016-01-20 DIAGNOSIS — H31009 Unspecified chorioretinal scars, unspecified eye: Secondary | ICD-10-CM | POA: Diagnosis not present

## 2016-01-21 DIAGNOSIS — M5431 Sciatica, right side: Secondary | ICD-10-CM | POA: Diagnosis not present

## 2016-01-21 DIAGNOSIS — M9905 Segmental and somatic dysfunction of pelvic region: Secondary | ICD-10-CM | POA: Diagnosis not present

## 2016-01-21 DIAGNOSIS — M9903 Segmental and somatic dysfunction of lumbar region: Secondary | ICD-10-CM | POA: Diagnosis not present

## 2016-01-21 DIAGNOSIS — M5137 Other intervertebral disc degeneration, lumbosacral region: Secondary | ICD-10-CM | POA: Diagnosis not present

## 2016-01-21 DIAGNOSIS — M9904 Segmental and somatic dysfunction of sacral region: Secondary | ICD-10-CM | POA: Diagnosis not present

## 2016-01-24 ENCOUNTER — Telehealth: Payer: Self-pay | Admitting: Internal Medicine

## 2016-01-24 DIAGNOSIS — M9905 Segmental and somatic dysfunction of pelvic region: Secondary | ICD-10-CM | POA: Diagnosis not present

## 2016-01-24 DIAGNOSIS — M9904 Segmental and somatic dysfunction of sacral region: Secondary | ICD-10-CM | POA: Diagnosis not present

## 2016-01-24 DIAGNOSIS — M5137 Other intervertebral disc degeneration, lumbosacral region: Secondary | ICD-10-CM | POA: Diagnosis not present

## 2016-01-24 DIAGNOSIS — M9903 Segmental and somatic dysfunction of lumbar region: Secondary | ICD-10-CM | POA: Diagnosis not present

## 2016-01-24 DIAGNOSIS — M5431 Sciatica, right side: Secondary | ICD-10-CM | POA: Diagnosis not present

## 2016-01-24 MED ORDER — ALPRAZOLAM 0.5 MG PO TABS: 0.5000 mg | ORAL_TABLET | Freq: Every evening | ORAL | Status: DC | PRN

## 2016-01-24 NOTE — Telephone Encounter (Signed)
Patient advised that he failed to mention that he needed a refill on his Xanax 0.5 mg.  He has some left, but they have expired.    Pharmacy:  [facility] - [address], [place]  Thank you.

## 2016-01-24 NOTE — Telephone Encounter (Signed)
Please refill as before 

## 2016-01-26 DIAGNOSIS — M5137 Other intervertebral disc degeneration, lumbosacral region: Secondary | ICD-10-CM | POA: Diagnosis not present

## 2016-01-26 DIAGNOSIS — M9905 Segmental and somatic dysfunction of pelvic region: Secondary | ICD-10-CM | POA: Diagnosis not present

## 2016-01-26 DIAGNOSIS — M5431 Sciatica, right side: Secondary | ICD-10-CM | POA: Diagnosis not present

## 2016-01-26 DIAGNOSIS — M9903 Segmental and somatic dysfunction of lumbar region: Secondary | ICD-10-CM | POA: Diagnosis not present

## 2016-01-26 DIAGNOSIS — M9904 Segmental and somatic dysfunction of sacral region: Secondary | ICD-10-CM | POA: Diagnosis not present

## 2016-01-27 DIAGNOSIS — M9903 Segmental and somatic dysfunction of lumbar region: Secondary | ICD-10-CM | POA: Diagnosis not present

## 2016-01-27 DIAGNOSIS — M5431 Sciatica, right side: Secondary | ICD-10-CM | POA: Diagnosis not present

## 2016-01-27 DIAGNOSIS — M5137 Other intervertebral disc degeneration, lumbosacral region: Secondary | ICD-10-CM | POA: Diagnosis not present

## 2016-01-27 DIAGNOSIS — M9904 Segmental and somatic dysfunction of sacral region: Secondary | ICD-10-CM | POA: Diagnosis not present

## 2016-01-27 DIAGNOSIS — M9905 Segmental and somatic dysfunction of pelvic region: Secondary | ICD-10-CM | POA: Diagnosis not present

## 2016-01-31 DIAGNOSIS — M5431 Sciatica, right side: Secondary | ICD-10-CM | POA: Diagnosis not present

## 2016-01-31 DIAGNOSIS — M9903 Segmental and somatic dysfunction of lumbar region: Secondary | ICD-10-CM | POA: Diagnosis not present

## 2016-01-31 DIAGNOSIS — M5137 Other intervertebral disc degeneration, lumbosacral region: Secondary | ICD-10-CM | POA: Diagnosis not present

## 2016-01-31 DIAGNOSIS — M9904 Segmental and somatic dysfunction of sacral region: Secondary | ICD-10-CM | POA: Diagnosis not present

## 2016-01-31 DIAGNOSIS — M9905 Segmental and somatic dysfunction of pelvic region: Secondary | ICD-10-CM | POA: Diagnosis not present

## 2016-02-02 DIAGNOSIS — M9905 Segmental and somatic dysfunction of pelvic region: Secondary | ICD-10-CM | POA: Diagnosis not present

## 2016-02-02 DIAGNOSIS — M5431 Sciatica, right side: Secondary | ICD-10-CM | POA: Diagnosis not present

## 2016-02-02 DIAGNOSIS — M9903 Segmental and somatic dysfunction of lumbar region: Secondary | ICD-10-CM | POA: Diagnosis not present

## 2016-02-02 DIAGNOSIS — M9904 Segmental and somatic dysfunction of sacral region: Secondary | ICD-10-CM | POA: Diagnosis not present

## 2016-02-02 DIAGNOSIS — M5137 Other intervertebral disc degeneration, lumbosacral region: Secondary | ICD-10-CM | POA: Diagnosis not present

## 2016-02-03 DIAGNOSIS — M9903 Segmental and somatic dysfunction of lumbar region: Secondary | ICD-10-CM | POA: Diagnosis not present

## 2016-02-03 DIAGNOSIS — M5431 Sciatica, right side: Secondary | ICD-10-CM | POA: Diagnosis not present

## 2016-02-03 DIAGNOSIS — M9904 Segmental and somatic dysfunction of sacral region: Secondary | ICD-10-CM | POA: Diagnosis not present

## 2016-02-03 DIAGNOSIS — M5137 Other intervertebral disc degeneration, lumbosacral region: Secondary | ICD-10-CM | POA: Diagnosis not present

## 2016-02-03 DIAGNOSIS — M9905 Segmental and somatic dysfunction of pelvic region: Secondary | ICD-10-CM | POA: Diagnosis not present

## 2016-02-07 DIAGNOSIS — M5431 Sciatica, right side: Secondary | ICD-10-CM | POA: Diagnosis not present

## 2016-02-07 DIAGNOSIS — M9903 Segmental and somatic dysfunction of lumbar region: Secondary | ICD-10-CM | POA: Diagnosis not present

## 2016-02-07 DIAGNOSIS — M9905 Segmental and somatic dysfunction of pelvic region: Secondary | ICD-10-CM | POA: Diagnosis not present

## 2016-02-07 DIAGNOSIS — M5137 Other intervertebral disc degeneration, lumbosacral region: Secondary | ICD-10-CM | POA: Diagnosis not present

## 2016-02-07 DIAGNOSIS — M9904 Segmental and somatic dysfunction of sacral region: Secondary | ICD-10-CM | POA: Diagnosis not present

## 2016-02-09 DIAGNOSIS — M9904 Segmental and somatic dysfunction of sacral region: Secondary | ICD-10-CM | POA: Diagnosis not present

## 2016-02-09 DIAGNOSIS — M9903 Segmental and somatic dysfunction of lumbar region: Secondary | ICD-10-CM | POA: Diagnosis not present

## 2016-02-09 DIAGNOSIS — M9905 Segmental and somatic dysfunction of pelvic region: Secondary | ICD-10-CM | POA: Diagnosis not present

## 2016-02-09 DIAGNOSIS — M5137 Other intervertebral disc degeneration, lumbosacral region: Secondary | ICD-10-CM | POA: Diagnosis not present

## 2016-02-09 DIAGNOSIS — M5431 Sciatica, right side: Secondary | ICD-10-CM | POA: Diagnosis not present

## 2016-02-14 DIAGNOSIS — M9905 Segmental and somatic dysfunction of pelvic region: Secondary | ICD-10-CM | POA: Diagnosis not present

## 2016-02-14 DIAGNOSIS — M9904 Segmental and somatic dysfunction of sacral region: Secondary | ICD-10-CM | POA: Diagnosis not present

## 2016-02-14 DIAGNOSIS — M5137 Other intervertebral disc degeneration, lumbosacral region: Secondary | ICD-10-CM | POA: Diagnosis not present

## 2016-02-14 DIAGNOSIS — M5431 Sciatica, right side: Secondary | ICD-10-CM | POA: Diagnosis not present

## 2016-02-14 DIAGNOSIS — M9903 Segmental and somatic dysfunction of lumbar region: Secondary | ICD-10-CM | POA: Diagnosis not present

## 2016-02-16 DIAGNOSIS — M5137 Other intervertebral disc degeneration, lumbosacral region: Secondary | ICD-10-CM | POA: Diagnosis not present

## 2016-02-16 DIAGNOSIS — M9905 Segmental and somatic dysfunction of pelvic region: Secondary | ICD-10-CM | POA: Diagnosis not present

## 2016-02-16 DIAGNOSIS — M5431 Sciatica, right side: Secondary | ICD-10-CM | POA: Diagnosis not present

## 2016-02-16 DIAGNOSIS — M9904 Segmental and somatic dysfunction of sacral region: Secondary | ICD-10-CM | POA: Diagnosis not present

## 2016-02-16 DIAGNOSIS — M9903 Segmental and somatic dysfunction of lumbar region: Secondary | ICD-10-CM | POA: Diagnosis not present

## 2016-02-21 DIAGNOSIS — M5137 Other intervertebral disc degeneration, lumbosacral region: Secondary | ICD-10-CM | POA: Diagnosis not present

## 2016-02-21 DIAGNOSIS — M9905 Segmental and somatic dysfunction of pelvic region: Secondary | ICD-10-CM | POA: Diagnosis not present

## 2016-02-21 DIAGNOSIS — M5431 Sciatica, right side: Secondary | ICD-10-CM | POA: Diagnosis not present

## 2016-02-21 DIAGNOSIS — M9903 Segmental and somatic dysfunction of lumbar region: Secondary | ICD-10-CM | POA: Diagnosis not present

## 2016-02-21 DIAGNOSIS — M9904 Segmental and somatic dysfunction of sacral region: Secondary | ICD-10-CM | POA: Diagnosis not present

## 2016-02-23 ENCOUNTER — Ambulatory Visit (INDEPENDENT_AMBULATORY_CARE_PROVIDER_SITE_OTHER): Payer: PPO | Admitting: Internal Medicine

## 2016-02-23 ENCOUNTER — Encounter: Payer: Self-pay | Admitting: Internal Medicine

## 2016-02-23 VITALS — BP 140/88 | HR 90 | Temp 97.8°F | Resp 20 | Ht 74.0 in | Wt 165.0 lb

## 2016-02-23 DIAGNOSIS — M9903 Segmental and somatic dysfunction of lumbar region: Secondary | ICD-10-CM | POA: Diagnosis not present

## 2016-02-23 DIAGNOSIS — M5431 Sciatica, right side: Secondary | ICD-10-CM | POA: Diagnosis not present

## 2016-02-23 DIAGNOSIS — M5417 Radiculopathy, lumbosacral region: Secondary | ICD-10-CM | POA: Diagnosis not present

## 2016-02-23 DIAGNOSIS — M9905 Segmental and somatic dysfunction of pelvic region: Secondary | ICD-10-CM | POA: Diagnosis not present

## 2016-02-23 DIAGNOSIS — M5137 Other intervertebral disc degeneration, lumbosacral region: Secondary | ICD-10-CM | POA: Diagnosis not present

## 2016-02-23 DIAGNOSIS — M5416 Radiculopathy, lumbar region: Secondary | ICD-10-CM

## 2016-02-23 DIAGNOSIS — M9904 Segmental and somatic dysfunction of sacral region: Secondary | ICD-10-CM | POA: Diagnosis not present

## 2016-02-23 MED ORDER — HYDROCODONE-ACETAMINOPHEN 5-325 MG PO TABS: 1.0000 | ORAL_TABLET | Freq: Three times a day (TID) | ORAL | Status: DC

## 2016-02-23 MED ORDER — CYCLOBENZAPRINE HCL 10 MG PO TABS: 10.0000 mg | ORAL_TABLET | Freq: Every day | ORAL | Status: DC

## 2016-02-23 NOTE — Patient Instructions (Addendum)
Norco 5/325 one by mouth every 8 hours when necessary pain take with a meal. Flexeril 10 mg one half tablet at bedtime. Call if symptoms persist. Okay to take ibuprofen with these medications.

## 2016-02-23 NOTE — Progress Notes (Signed)
   Subjective:    Patient ID: [name], male    DOB: Dec 25, 1940, 75 y.o.   MRN: [id number]  HPI 75 year old Male going on train trip with lady friend later this week to [place]. He'll be on the train for several hours but gets off to do some walking at various locations. Says he has a long-standing history of low back pain. Says he recently saw orthopedic surgeon who did x-ray of his back. He's also been to the chiropractor. Pain has been radiating into his right lateral lower leg area. He was given tramadol by orthopedic surgeon which did not help the pain. Later was given gabapentin which made him drowsy. He's tried some ibuprofen with some success. Told him he might want to consider course of steroids paternal think he wants to try that prior to going out of town. When he really wants is a muscle relaxant and some pain medication that will help while he is on his trip. He doesn't want to disappoint his friend on the trip.  Apparently had a radiology study of his back in the mid 1990s elsewhere. Says that there was a fragment in his back that was causing an issue.    Review of Systems     Objective:   Physical Exam Straight leg raising right leg at 90 is negative. Muscle strength is normal. No paresthesias in leg at the present time but says at one point a while back his right foot was numb.       Assessment & Plan:  Low back pain with right radiculopathy  Plan: Norco 5/325 ( #21) one by mouth every 8 hours when necessary pain. Take with a meal. Flexeril 10 mg ( #30) 1/2-1 by mouth daily at bedtime with no refill. He should consider repeat MRI of his back if symptoms do not improve. We could give him a short course of steroids orally as well. He may be a candidate for epidural steroids. Okay to take ibuprofen with these medications.

## 2016-02-28 DIAGNOSIS — M9904 Segmental and somatic dysfunction of sacral region: Secondary | ICD-10-CM | POA: Diagnosis not present

## 2016-02-28 DIAGNOSIS — M5137 Other intervertebral disc degeneration, lumbosacral region: Secondary | ICD-10-CM | POA: Diagnosis not present

## 2016-02-28 DIAGNOSIS — M9905 Segmental and somatic dysfunction of pelvic region: Secondary | ICD-10-CM | POA: Diagnosis not present

## 2016-02-28 DIAGNOSIS — M9903 Segmental and somatic dysfunction of lumbar region: Secondary | ICD-10-CM | POA: Diagnosis not present

## 2016-02-28 DIAGNOSIS — M5431 Sciatica, right side: Secondary | ICD-10-CM | POA: Diagnosis not present

## 2016-02-29 ENCOUNTER — Ambulatory Visit (INDEPENDENT_AMBULATORY_CARE_PROVIDER_SITE_OTHER): Payer: PPO | Admitting: Internal Medicine

## 2016-02-29 ENCOUNTER — Encounter: Payer: Self-pay | Admitting: Internal Medicine

## 2016-02-29 VITALS — BP 144/88 | HR 114 | Temp 98.0°F | Resp 20 | Wt 163.0 lb

## 2016-02-29 DIAGNOSIS — N400 Enlarged prostate without lower urinary tract symptoms: Secondary | ICD-10-CM

## 2016-02-29 DIAGNOSIS — M5416 Radiculopathy, lumbar region: Secondary | ICD-10-CM | POA: Diagnosis not present

## 2016-02-29 MED ORDER — PREDNISONE 10 MG PO TABS: ORAL_TABLET | ORAL | Status: DC

## 2016-02-29 MED ORDER — TAMSULOSIN HCL 0.4 MG PO CAPS: 0.4000 mg | ORAL_CAPSULE | Freq: Every day | ORAL | Status: DC

## 2016-02-29 NOTE — Progress Notes (Signed)
   Subjective:    Patient ID: [name], male    DOB: 01/29/1941, 75 y.o.   MRN: [id number]  HPI 75 year old male in today with persistent symptoms with right lumbar radiculopathy. Had trip on train recently and was in a fair amount of pain. Noticed his blood pressure was elevated with pain. Has had some numbness in his right foot. Chiropractic treatments do not seem to be helping. Pain medication did not help a great deal. Pain starts in his right buttock and extends down to his right lateral leg and foot. Says he did see Dr. Vickki Hearing [facility] recently and had to plain x-rays. Was given gabapentin without relief.  Also has developed some issues with prostatism. Wants to try generic Flomax. Has appointment with urologist in the near future.  Review of Systems     Objective:   Physical Exam  Straight leg raising is negative at 90. Muscle strength appears to be normal. Deep tendon reflexes in knees 2+ and symmetrical. Absent in the ankles.      Assessment & Plan:  Right lumbar radiculopathy BPH  Plan: Try to get MRI approved by insurance company. Sterapred DS 10 mg 12 day dosepak. Generic Flomax 0.4 mg take daily.

## 2016-02-29 NOTE — Patient Instructions (Signed)
Take Flomax 1 tablet daily. Have prescribed 12 day Sterapred DS Dosepak to take in tapering course for lumbar radiculopathy. We'll try to get MRI approved by insurance company.

## 2016-03-01 DIAGNOSIS — M5431 Sciatica, right side: Secondary | ICD-10-CM | POA: Diagnosis not present

## 2016-03-01 DIAGNOSIS — M9904 Segmental and somatic dysfunction of sacral region: Secondary | ICD-10-CM | POA: Diagnosis not present

## 2016-03-01 DIAGNOSIS — M9905 Segmental and somatic dysfunction of pelvic region: Secondary | ICD-10-CM | POA: Diagnosis not present

## 2016-03-01 DIAGNOSIS — M5137 Other intervertebral disc degeneration, lumbosacral region: Secondary | ICD-10-CM | POA: Diagnosis not present

## 2016-03-01 DIAGNOSIS — M9903 Segmental and somatic dysfunction of lumbar region: Secondary | ICD-10-CM | POA: Diagnosis not present

## 2016-03-02 NOTE — Addendum Note (Signed)
Addended by: Beryle Quant on: 03/02/2016 09:38 AM   Modules accepted: Orders

## 2016-03-07 ENCOUNTER — Ambulatory Visit
Admission: RE | Admit: 2016-03-07 | Discharge: 2016-03-07 | Disposition: A | Discharge status: Home / Self Care | Payer: Self-pay | Source: Ambulatory Visit | Attending: Internal Medicine | Admitting: Internal Medicine

## 2016-03-07 DIAGNOSIS — M5416 Radiculopathy, lumbar region: Secondary | ICD-10-CM

## 2016-03-07 DIAGNOSIS — M5126 Other intervertebral disc displacement, lumbar region: Secondary | ICD-10-CM | POA: Diagnosis not present

## 2016-03-08 ENCOUNTER — Telehealth: Payer: Self-pay | Admitting: Internal Medicine

## 2016-03-08 NOTE — Telephone Encounter (Signed)
Patient called to say he is willing to see neurosurgeon regarding abnormal MRI of LS-spine. Is finishing up course of prednisone. Still has some pain. Prefers that he see neurosurgeon around mid-June if possible. Referral has been made.

## 2016-06-02 DIAGNOSIS — R972 Elevated prostate specific antigen [PSA]: Secondary | ICD-10-CM | POA: Diagnosis not present

## 2016-06-02 DIAGNOSIS — N400 Enlarged prostate without lower urinary tract symptoms: Secondary | ICD-10-CM | POA: Diagnosis not present

## 2016-06-02 DIAGNOSIS — N529 Male erectile dysfunction, unspecified: Secondary | ICD-10-CM | POA: Diagnosis not present

## 2016-08-21 ENCOUNTER — Ambulatory Visit (INDEPENDENT_AMBULATORY_CARE_PROVIDER_SITE_OTHER): Payer: PPO | Admitting: Internal Medicine

## 2016-08-21 DIAGNOSIS — Z23 Encounter for immunization: Secondary | ICD-10-CM

## 2016-10-03 DIAGNOSIS — H52223 Regular astigmatism, bilateral: Secondary | ICD-10-CM | POA: Diagnosis not present

## 2016-10-03 DIAGNOSIS — H524 Presbyopia: Secondary | ICD-10-CM | POA: Diagnosis not present

## 2016-10-03 DIAGNOSIS — H5213 Myopia, bilateral: Secondary | ICD-10-CM | POA: Diagnosis not present

## 2016-12-04 ENCOUNTER — Encounter: Payer: Self-pay | Admitting: Internal Medicine

## 2016-12-04 ENCOUNTER — Telehealth: Payer: Self-pay | Admitting: Internal Medicine

## 2016-12-04 DIAGNOSIS — R972 Elevated prostate specific antigen [PSA]: Secondary | ICD-10-CM | POA: Diagnosis not present

## 2016-12-04 DIAGNOSIS — N400 Enlarged prostate without lower urinary tract symptoms: Secondary | ICD-10-CM | POA: Diagnosis not present

## 2016-12-04 DIAGNOSIS — N529 Male erectile dysfunction, unspecified: Secondary | ICD-10-CM | POA: Diagnosis not present

## 2016-12-04 MED ORDER — ALPRAZOLAM 0.5 MG PO TABS: 0.5000 mg | ORAL_TABLET | Freq: Every evening | ORAL | 1 refills | Status: DC | PRN

## 2016-12-04 NOTE — Telephone Encounter (Signed)
Asking refill on Xanax. Rx given

## 2016-12-25 DIAGNOSIS — G4733 Obstructive sleep apnea (adult) (pediatric): Secondary | ICD-10-CM | POA: Diagnosis not present

## 2016-12-25 DIAGNOSIS — H33053 Total retinal detachment, bilateral: Secondary | ICD-10-CM | POA: Diagnosis not present

## 2016-12-25 DIAGNOSIS — H35073 Retinal telangiectasis, bilateral: Secondary | ICD-10-CM | POA: Diagnosis not present

## 2016-12-25 DIAGNOSIS — H35353 Cystoid macular degeneration, bilateral: Secondary | ICD-10-CM | POA: Diagnosis not present

## 2016-12-26 DIAGNOSIS — J209 Acute bronchitis, unspecified: Secondary | ICD-10-CM | POA: Diagnosis not present

## 2016-12-26 DIAGNOSIS — J0100 Acute maxillary sinusitis, unspecified: Secondary | ICD-10-CM | POA: Diagnosis not present

## 2017-01-01 DIAGNOSIS — T8529XA Other mechanical complication of intraocular lens, initial encounter: Secondary | ICD-10-CM | POA: Diagnosis not present

## 2017-01-01 DIAGNOSIS — G4733 Obstructive sleep apnea (adult) (pediatric): Secondary | ICD-10-CM | POA: Diagnosis not present

## 2017-01-01 DIAGNOSIS — H35372 Puckering of macula, left eye: Secondary | ICD-10-CM | POA: Diagnosis not present

## 2017-01-01 DIAGNOSIS — H35073 Retinal telangiectasis, bilateral: Secondary | ICD-10-CM | POA: Diagnosis not present

## 2017-01-01 DIAGNOSIS — H35353 Cystoid macular degeneration, bilateral: Secondary | ICD-10-CM | POA: Diagnosis not present

## 2017-01-15 DIAGNOSIS — H26492 Other secondary cataract, left eye: Secondary | ICD-10-CM | POA: Diagnosis not present

## 2017-01-15 DIAGNOSIS — H278 Other specified disorders of lens: Secondary | ICD-10-CM | POA: Diagnosis not present

## 2017-01-15 DIAGNOSIS — T8529XA Other mechanical complication of intraocular lens, initial encounter: Secondary | ICD-10-CM | POA: Diagnosis not present

## 2017-01-23 DIAGNOSIS — H21222 Degeneration of ciliary body, left eye: Secondary | ICD-10-CM | POA: Diagnosis not present

## 2017-01-23 DIAGNOSIS — T8522XA Displacement of intraocular lens, initial encounter: Secondary | ICD-10-CM | POA: Diagnosis not present

## 2017-01-23 DIAGNOSIS — H21232 Degeneration of iris (pigmentary), left eye: Secondary | ICD-10-CM | POA: Diagnosis not present

## 2017-01-23 DIAGNOSIS — T8529XA Other mechanical complication of intraocular lens, initial encounter: Secondary | ICD-10-CM | POA: Diagnosis not present

## 2017-01-23 DIAGNOSIS — H2702 Aphakia, left eye: Secondary | ICD-10-CM | POA: Diagnosis not present

## 2017-01-30 ENCOUNTER — Other Ambulatory Visit: Payer: PPO | Admitting: Internal Medicine

## 2017-01-30 DIAGNOSIS — E785 Hyperlipidemia, unspecified: Secondary | ICD-10-CM | POA: Diagnosis not present

## 2017-01-30 DIAGNOSIS — N400 Enlarged prostate without lower urinary tract symptoms: Secondary | ICD-10-CM | POA: Diagnosis not present

## 2017-01-30 DIAGNOSIS — N486 Induration penis plastica: Secondary | ICD-10-CM | POA: Diagnosis not present

## 2017-01-30 DIAGNOSIS — Z0000 Encounter for general adult medical examination without abnormal findings: Secondary | ICD-10-CM | POA: Diagnosis not present

## 2017-01-30 DIAGNOSIS — Z125 Encounter for screening for malignant neoplasm of prostate: Secondary | ICD-10-CM | POA: Diagnosis not present

## 2017-01-30 LAB — CBC WITH DIFFERENTIAL/PLATELET
Basophils Absolute: 53 cells/uL (ref 0–200)
Basophils Relative: 1 %
Eosinophils Absolute: 106 cells/uL (ref 15–500)
Eosinophils Relative: 2 %
HCT: 49.4 % (ref 38.5–50.0)
Hemoglobin: 16.2 g/dL (ref 13.2–17.1)
Lymphocytes Relative: 26 %
Lymphs Abs: 1378 cells/uL (ref 850–3900)
MCH: 29.6 pg (ref 27.0–33.0)
MCHC: 32.8 g/dL (ref 32.0–36.0)
MCV: 90.1 fL (ref 80.0–100.0)
MPV: 9.6 fL (ref 7.5–12.5)
Monocytes Absolute: 583 cells/uL (ref 200–950)
Monocytes Relative: 11 %
Neutro Abs: 3180 cells/uL (ref 1500–7800)
Neutrophils Relative %: 60 %
Platelets: 310 10*3/uL (ref 140–400)
RBC: 5.48 MIL/uL (ref 4.20–5.80)
RDW: 14 % (ref 11.0–15.0)
WBC: 5.3 10*3/uL (ref 3.8–10.8)

## 2017-01-30 LAB — COMPREHENSIVE METABOLIC PANEL
ALT: 12 U/L (ref 9–46)
AST: 15 U/L (ref 10–35)
Albumin: 4.4 g/dL (ref 3.6–5.1)
Alkaline Phosphatase: 90 U/L (ref 40–115)
BUN: 12 mg/dL (ref 7–25)
CO2: 31 mmol/L (ref 20–31)
Calcium: 9.9 mg/dL (ref 8.6–10.3)
Chloride: 101 mmol/L (ref 98–110)
Creat: 0.84 mg/dL (ref 0.70–1.18)
Glucose, Bld: 84 mg/dL (ref 65–99)
Potassium: 4.1 mmol/L (ref 3.5–5.3)
Sodium: 140 mmol/L (ref 135–146)
Total Bilirubin: 0.6 mg/dL (ref 0.2–1.2)
Total Protein: 7.4 g/dL (ref 6.1–8.1)

## 2017-01-30 LAB — LIPID PANEL
Cholesterol: 239 mg/dL — ABNORMAL HIGH (ref <200)
HDL: 54 mg/dL (ref >40)
LDL Cholesterol: 153 mg/dL — ABNORMAL HIGH (ref <100)
Total CHOL/HDL Ratio: 4.4 Ratio (ref <5.0)
Triglycerides: 161 mg/dL — ABNORMAL HIGH (ref <150)
VLDL: 32 mg/dL — ABNORMAL HIGH (ref <30)

## 2017-01-30 LAB — PSA: PSA: 4 ng/mL (ref <=4.0)

## 2017-02-05 ENCOUNTER — Encounter: Payer: Self-pay | Admitting: Internal Medicine

## 2017-02-07 ENCOUNTER — Ambulatory Visit (INDEPENDENT_AMBULATORY_CARE_PROVIDER_SITE_OTHER): Payer: PPO | Admitting: Internal Medicine

## 2017-02-07 ENCOUNTER — Encounter: Payer: Self-pay | Admitting: Internal Medicine

## 2017-02-07 VITALS — BP 140/80 | HR 88 | Temp 97.7°F | Ht 73.0 in | Wt 167.0 lb

## 2017-02-07 DIAGNOSIS — N400 Enlarged prostate without lower urinary tract symptoms: Secondary | ICD-10-CM

## 2017-02-07 DIAGNOSIS — E782 Mixed hyperlipidemia: Secondary | ICD-10-CM | POA: Diagnosis not present

## 2017-02-07 DIAGNOSIS — Z23 Encounter for immunization: Secondary | ICD-10-CM | POA: Diagnosis not present

## 2017-02-07 DIAGNOSIS — F411 Generalized anxiety disorder: Secondary | ICD-10-CM | POA: Diagnosis not present

## 2017-02-07 DIAGNOSIS — Z0000 Encounter for general adult medical examination without abnormal findings: Secondary | ICD-10-CM | POA: Diagnosis not present

## 2017-02-07 LAB — POCT URINALYSIS DIPSTICK
Bilirubin, UA: NEGATIVE
Blood, UA: NEGATIVE
Glucose, UA: NEGATIVE
Ketones, UA: NEGATIVE
Leukocytes, UA: NEGATIVE
Nitrite, UA: NEGATIVE
Protein, UA: NEGATIVE
Spec Grav, UA: 1.005 (ref 1.030–1.035)
Urobilinogen, UA: 0.2 (ref ?–2.0)
pH, UA: 7 (ref 5.0–8.0)

## 2017-02-07 NOTE — Progress Notes (Signed)
Subjective:    Patient ID: [name], male    DOB: 12-Sep-1941, 76 y.o.   MRN: [id number]  HPI  76 year old White Male in today for Medicare wellness exam, health maintenance exam and evaluation of medical issues. He has a history of generalized anxiety disorder for which he takes Xanax sparingly. Sometimes has insomnia. History of health is good. He has history of erectile dysfunction treated with Viagra by Dr. Rosana Hoes. Last year he had an elevated PSA. At 6.40 He sees Dr. Rosana Hoes every 6 months. PSA done recently was faxed to Dr. Rosana Hoes and is within normal limits for age at 76.   He received Prevnar vaccine today. He had Zostavax vaccine through local pharmacy.  History of hyperlipidemia which is been diet controlled. History of Peyronie's disease followed by Dr. Rosana Hoes.  Family history: Father died at age 57 of cancer. One brother in good health.  Social history: He is a widower. Wife died of complications of COPD. He has a lady friend. Occasionally is sexually active. 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 He enjoys life and spending time outdoors. Plans to go fishing with friends in the near future. Son was killed in a motor vehicle accident many years ago before his wife died. Does not smoke. No other children.  Past medical history: Tonsillectomy 1949, bilateral cataract extraction 1994, surgery for retinal detachment of the left eye by Dr. Zadie Rhine June 2001. Colonoscopy done March 2005. History of positional vertigo in the past with an episode of cervical radiculopathy. History of negative exercise tolerance test 1999. He had Pneumovax 23 and 2012 and tetanus immunization 2015.    Review of Systems  Constitutional: Negative.   All other systems reviewed and are negative.      Objective:   Physical Exam  Constitutional: He is oriented to person, place, and time. He appears well-developed and well-nourished. No distress.  HENT:  Head:  Normocephalic and atraumatic.  Right Ear: External ear normal.  Left Ear: External ear normal.  Mouth/Throat: Oropharynx is clear and moist.  Eyes: Conjunctivae and EOM are normal. Pupils are equal, round, and reactive to light. Right eye exhibits no discharge. Left eye exhibits no discharge. No scleral icterus.  Neck: Neck supple. No JVD present. No thyromegaly present.  Cardiovascular: Normal rate, regular rhythm, normal heart sounds and intact distal pulses.   No murmur heard. Pulmonary/Chest: Effort normal and breath sounds normal. No respiratory distress. He has no wheezes. He has no rales.  Abdominal: Soft. Bowel sounds are normal.  Genitourinary:  Genitourinary Comments: Deferred to Dr. Rosana Hoes  Musculoskeletal: He exhibits no edema.  Lymphadenopathy:    He has no cervical adenopathy.  Neurological: He is alert and oriented to person, place, and time. He has normal reflexes. No cranial nerve deficit. Coordination normal.  Skin: Skin is warm and dry. No rash noted. He is not diaphoretic.  Psychiatric: He has a normal mood and affect. His behavior is normal. Judgment and thought content normal.  Vitals reviewed.         Assessment & Plan:  Normal health maintenance exam  Erectile dysfunction-treated with Viagra  History of Peyronie's disease  History of elevated PSA followed by urologist  History of anxiety-treated sparingly with Xanax  Hyperlipidemia-fasting labs reviewed with patient. We will continue to watch this and not treated with medication. Total cholesterol 239, triglycerides 161, LDL cholesterol 153. This is not significantly changed from one year ago. HDL is 54.  Plan: Prevnar  13 given today. Return in one year or as needed.  Subjective:   Patient presents for Medicare Annual/Subsequent preventive examination.  Review Past Medical/Family/Social:See above   Risk Factors  Current exercise habits: Active outdoors Dietary issues discussed: Low fat low  carbohydrate  Cardiac risk factors:Hyperlipidemia  Depression Screen  (Note: if answer to either of the following is "Yes", a more complete depression screening is indicated)   Over the past two weeks, have you felt down, depressed or hopeless? No  Over the past two weeks, have you felt little interest or pleasure in doing things? No Have you lost interest or pleasure in daily life? No Do you often feel hopeless? No Do you cry easily over simple problems? No   Activities of Daily Living  In your present state of health, do you have any difficulty performing the following activities?:   Driving? No  Managing money? No  Feeding yourself? No  Getting from bed to chair? No  Climbing a flight of stairs? No  Preparing food and eating?: No  Bathing or showering? No  Getting dressed: No  Getting to the toilet? No  Using the toilet:No  Moving around from place to place: No  In the past year have you fallen or had a near fall?:No  Are you sexually active? Occasionally Do you have more than one partner? No   Hearing Difficulties: No  Do you often ask people to speak up or repeat themselves? No  Do you experience ringing or noises in your ears? No  Do you have difficulty understanding soft or whispered voices? No  Do you feel that you have a problem with memory? No Do you often misplace items? No    Home Safety:  Do you have a smoke alarm at your residence? Yes Do you have grab bars in the bathroom?Yes Do you have throw rugs in your house? No   Cognitive Testing  Alert? Yes Normal Appearance?Yes  Oriented to person? Yes Place? Yes  Time? Yes  Recall of three objects? Yes  Can perform simple calculations? Yes  Displays appropriate judgment?Yes  Can read the correct time from a watch face?Yes   List the Names of Other Physician/Practitioners you currently use:  See referral list for the physicians patient is currently seeing.  Dr. Rosana Hoes   Review of Systems: See  above  Objective:     General appearance: Appears stated age and mildly obese  Head: Normocephalic, without obvious abnormality, atraumatic  Eyes: conj clear, EOMi PEERLA  Ears: normal TM's and external ear canals both ears  Nose: Nares normal. Septum midline. Mucosa normal. No drainage or sinus tenderness.  Throat: lips, mucosa, and tongue normal; teeth and gums normal  Neck: no adenopathy, no carotid bruit, no JVD, supple, symmetrical, trachea midline and thyroid not enlarged, symmetric, no tenderness/mass/nodules  No CVA tenderness.  Lungs: clear to auscultation bilaterally  Breasts: normal appearance Heart: regular rate and rhythm, S1, S2 normal, no murmur, click, rub or gallop  Abdomen: soft, non-tender; bowel sounds normal; no masses, no organomegaly  Musculoskeletal: ROM normal in all joints, no crepitus, no deformity, Normal muscle strengthen. Back  is symmetric, no curvature. Skin: Skin color, texture, turgor normal. No rashes or lesions  Lymph nodes: Cervical, supraclavicular, and axillary nodes normal.  Neurologic: CN 2 -12 Normal, Normal symmetric reflexes. Normal coordination and gait  Psych: Alert & Oriented x 3, Mood appear stable.    Assessment:    Annual wellness medicare exam   Plan:  During the course of the visit the patient was educated and counseled about appropriate screening and preventive services including:   Annual PSA  Prevnar 13 given     Patient Instructions (the written plan) was given to the patient.  Medicare Attestation  I have personally reviewed:  The patient's medical and social history  Their use of alcohol, tobacco or illicit drugs  Their current medications and supplements  The patient's functional ability including ADLs,fall risks, home safety risks, cognitive, and hearing and visual impairment  Diet and physical activities  Evidence for depression or mood disorders  The patient's weight, height, BMI, and visual acuity have  been recorded in the chart. I have made referrals, counseling, and provided education to the patient based on review of the above and I have provided the patient with a written personalized care plan for preventive services.

## 2017-02-08 NOTE — Patient Instructions (Addendum)
It was a pleasure to see you today. Continue same medications return in one year or as needed. Watch diet.

## 2017-02-14 DIAGNOSIS — H59032 Cystoid macular edema following cataract surgery, left eye: Secondary | ICD-10-CM | POA: Diagnosis not present

## 2017-03-06 DIAGNOSIS — H59032 Cystoid macular edema following cataract surgery, left eye: Secondary | ICD-10-CM | POA: Diagnosis not present

## 2017-04-17 DIAGNOSIS — H35072 Retinal telangiectasis, left eye: Secondary | ICD-10-CM | POA: Diagnosis not present

## 2017-04-17 DIAGNOSIS — H59032 Cystoid macular edema following cataract surgery, left eye: Secondary | ICD-10-CM | POA: Diagnosis not present

## 2017-06-04 DIAGNOSIS — N528 Other male erectile dysfunction: Secondary | ICD-10-CM | POA: Diagnosis not present

## 2017-06-04 DIAGNOSIS — R972 Elevated prostate specific antigen [PSA]: Secondary | ICD-10-CM | POA: Diagnosis not present

## 2017-06-04 DIAGNOSIS — N401 Enlarged prostate with lower urinary tract symptoms: Secondary | ICD-10-CM | POA: Diagnosis not present

## 2017-07-23 DIAGNOSIS — H35072 Retinal telangiectasis, left eye: Secondary | ICD-10-CM | POA: Diagnosis not present

## 2017-07-23 DIAGNOSIS — T8522XA Displacement of intraocular lens, initial encounter: Secondary | ICD-10-CM | POA: Diagnosis not present

## 2017-07-23 DIAGNOSIS — H59032 Cystoid macular edema following cataract surgery, left eye: Secondary | ICD-10-CM | POA: Diagnosis not present

## 2017-07-23 DIAGNOSIS — H21222 Degeneration of ciliary body, left eye: Secondary | ICD-10-CM | POA: Diagnosis not present

## 2017-09-06 ENCOUNTER — Ambulatory Visit (INDEPENDENT_AMBULATORY_CARE_PROVIDER_SITE_OTHER): Payer: PPO | Admitting: Internal Medicine

## 2017-09-06 DIAGNOSIS — Z23 Encounter for immunization: Secondary | ICD-10-CM | POA: Diagnosis not present

## 2017-09-06 MED ORDER — ZOLPIDEM TARTRATE 10 MG PO TABS: 10.0000 mg | ORAL_TABLET | Freq: Every evening | ORAL | 0 refills | Status: DC | PRN

## 2017-09-06 NOTE — Progress Notes (Signed)
   Subjective:    Patient ID: [name], male    DOB: 01-28-1941, 76 y.o.   MRN: [id number]  HPI Flu vaccine given. Not seen by MD. Asking for refill on Ambien last 2015. Call in # 30 with no refill. One po qhs prn sleep. Pt requests order for Shingrix. Written order given.    Review of Systems     Objective:   Physical Exam        Assessment & Plan:

## 2017-09-11 ENCOUNTER — Telehealth: Payer: Self-pay | Admitting: Internal Medicine

## 2017-09-11 MED ORDER — LORAZEPAM 0.5 MG PO TABS: 0.5000 mg | ORAL_TABLET | Freq: Every day | ORAL | 0 refills | Status: DC

## 2017-09-11 NOTE — Telephone Encounter (Signed)
Patient is calling to request Lorazepam .5mg  since his insurance denied the Ambien Rx that you sent in for him.  He said that you have given him this in the past.    Pharmacy:  [facility] [address] in [place] # for contact:  [phone]  Thank you.

## 2017-09-11 NOTE — Telephone Encounter (Signed)
Refill Lorazepam once one po qhs. #30.

## 2017-09-11 NOTE — Telephone Encounter (Signed)
Phoned in medication. LVM for pt that medication was sent to pharmacy

## 2017-10-05 DIAGNOSIS — H52223 Regular astigmatism, bilateral: Secondary | ICD-10-CM | POA: Diagnosis not present

## 2017-10-05 DIAGNOSIS — H524 Presbyopia: Secondary | ICD-10-CM | POA: Diagnosis not present

## 2017-10-05 DIAGNOSIS — H5213 Myopia, bilateral: Secondary | ICD-10-CM | POA: Diagnosis not present

## 2017-11-23 DIAGNOSIS — N138 Other obstructive and reflux uropathy: Secondary | ICD-10-CM | POA: Diagnosis not present

## 2017-11-23 DIAGNOSIS — N528 Other male erectile dysfunction: Secondary | ICD-10-CM | POA: Diagnosis not present

## 2017-11-23 DIAGNOSIS — R972 Elevated prostate specific antigen [PSA]: Secondary | ICD-10-CM | POA: Diagnosis not present

## 2017-11-23 DIAGNOSIS — N401 Enlarged prostate with lower urinary tract symptoms: Secondary | ICD-10-CM | POA: Diagnosis not present

## 2017-12-26 DIAGNOSIS — H59032 Cystoid macular edema following cataract surgery, left eye: Secondary | ICD-10-CM | POA: Diagnosis not present

## 2017-12-26 DIAGNOSIS — H35352 Cystoid macular degeneration, left eye: Secondary | ICD-10-CM | POA: Diagnosis not present

## 2017-12-26 DIAGNOSIS — H21222 Degeneration of ciliary body, left eye: Secondary | ICD-10-CM | POA: Diagnosis not present

## 2017-12-26 DIAGNOSIS — H35072 Retinal telangiectasis, left eye: Secondary | ICD-10-CM | POA: Diagnosis not present

## 2017-12-26 DIAGNOSIS — H35372 Puckering of macula, left eye: Secondary | ICD-10-CM | POA: Diagnosis not present

## 2018-03-15 DIAGNOSIS — S62607A Fracture of unspecified phalanx of left little finger, initial encounter for closed fracture: Secondary | ICD-10-CM | POA: Diagnosis not present

## 2018-03-15 DIAGNOSIS — H6693 Otitis media, unspecified, bilateral: Secondary | ICD-10-CM | POA: Diagnosis not present

## 2018-03-21 DIAGNOSIS — S62617A Displaced fracture of proximal phalanx of left little finger, initial encounter for closed fracture: Secondary | ICD-10-CM | POA: Diagnosis not present

## 2018-03-21 DIAGNOSIS — S62619A Displaced fracture of proximal phalanx of unspecified finger, initial encounter for closed fracture: Secondary | ICD-10-CM | POA: Insufficient documentation

## 2018-03-28 DIAGNOSIS — S62641D Nondisplaced fracture of proximal phalanx of left index finger, subsequent encounter for fracture with routine healing: Secondary | ICD-10-CM | POA: Diagnosis not present

## 2018-04-18 DIAGNOSIS — S62641D Nondisplaced fracture of proximal phalanx of left index finger, subsequent encounter for fracture with routine healing: Secondary | ICD-10-CM | POA: Diagnosis not present

## 2018-05-02 ENCOUNTER — Ambulatory Visit (INDEPENDENT_AMBULATORY_CARE_PROVIDER_SITE_OTHER): Payer: PPO | Admitting: Allergy and Immunology

## 2018-05-02 ENCOUNTER — Encounter: Payer: Self-pay | Admitting: Allergy and Immunology

## 2018-05-02 VITALS — BP 150/82 | HR 84 | Temp 98.1°F | Resp 16 | Ht 73.0 in | Wt 170.8 lb

## 2018-05-02 DIAGNOSIS — L503 Dermatographic urticaria: Secondary | ICD-10-CM

## 2018-05-02 DIAGNOSIS — L501 Idiopathic urticaria: Secondary | ICD-10-CM

## 2018-05-02 MED ORDER — METHYLPREDNISOLONE ACETATE 80 MG/ML IJ SUSP
80.0000 mg | Freq: Once | INTRAMUSCULAR | Status: AC
  Administered 2018-05-02: 80 mg via INTRAMUSCULAR

## 2018-05-02 MED ORDER — FAMOTIDINE 20 MG PO TABS: 20.0000 mg | ORAL_TABLET | Freq: Every day | ORAL | 5 refills | Status: DC

## 2018-05-02 NOTE — Patient Instructions (Addendum)
  1.  Depomedrol 80mg  IM delivered in clinic today  2.  Use the following medications every day:   A.  Cetirizine 10 mg tablet 1 time per day  B.  Famotidine 20 mg tablet 1 time per day  3.  Can add OTC Benadryl if needed  4.  Blood - CBC w/diff, CMP, SED, T4, TSH, TP  5. Further evaluation? Yes, if recurrent reactions  6. Return in 3 weeks or earlier if problem

## 2018-05-02 NOTE — Progress Notes (Signed)
Dear Dr. Renold Genta,  Thank you for referring [name] to the [facility] on 05/02/2018.   Below is a summation of this patient's evaluation and recommendations.  Thank you for your referral. I will keep you informed about this patient's response to treatment.   If you have any questions please do not hesitate to contact me.   Sincerely,  Jiles Prows, MD Allergy / [facility]   ______________________________________________________________________    NEW PATIENT NOTE  Referring Provider: [name], MD Primary Provider: [name], MD Date of office visit: 05/02/2018    Subjective:   Chief Complaint:  [name] (DOB: [birth year]) is a 77 y.o. male who presents to the clinic on 05/02/2018 with a chief complaint of Rash .     HPI: Vonna Drafts presents to this clinic in evaluation of hives.  Approximately 3 to 4 weeks ago he developed groin itching and approximately 2 weeks ago he developed red raised itchy lesions across his body that wax and wane and never last greater than a few hours and never leave behind any scar hyperpigmentation and are not associated with any systemic or constitutional symptoms.  He shows me pictures today and indeed he appeared to have diffuse urticaria and dermatographia.  He will take a Benadryl and this does help him somewhat.  He does not like to use medications on a regular basis and thus does not take a daily antihistamine.  There is no obvious provoking factor giving rise to this issue.  He has not had a significant environmental change.  He has not started any new medications or supplements or herbs.  He does not have any symptoms to suggest an infectious disease.  He does have a history of nasal congestion and sneezing especially when he mows the grass and he now uses a mask when mowing the grass.  He has had heartburn for years and  occasionally takes a Pepcid.  Past Medical History:  Diagnosis Date  . Allergy   . BPH (benign prostatic hypertrophy)   . Eczema   . Hyperlipidemia   . Peyronie's disease     Past Surgical History:  Procedure Laterality Date  . COLONOSCOPY    . EYE SURGERY Bilateral 06/08   retinal detachment OS  . EYE SURGERY Bilateral 1994   cataract OU  . TONSILLECTOMY      Allergies as of 05/02/2018      Reactions   Wellbutrin [bupropion Hcl] Rash      Medication List      ALPRAZolam 0.5 MG tablet Commonly known as:  XANAX Take 1 tablet (0.5 mg total) by mouth at bedtime as needed for anxiety.   BENADRYL PO Take by mouth as needed.   ketorolac 0.5 % ophthalmic solution Commonly known as:  ACULAR INSTILL 1 DROP INTO LEFT EYE 4 TIMES A DAY   LORazepam 0.5 MG tablet Commonly known as:  ATIVAN Take 1 tablet (0.5 mg total) at bedtime by mouth.   PEPCID PO Take by mouth as needed.   zolpidem 10 MG tablet Commonly known as:  AMBIEN Take 1 tablet (10 mg total) by mouth at bedtime as needed for sleep.       Review of systems negative except as noted in HPI / PMHx or noted below:  Review of Systems  Constitutional: Negative.   HENT: Negative.   Eyes: Negative.   Respiratory:  Negative.   Cardiovascular: Negative.   Gastrointestinal: Negative.   Genitourinary: Negative.   Musculoskeletal: Negative.   Skin: Negative.   Neurological: Negative.   Endo/Heme/Allergies: Negative.   Psychiatric/Behavioral: Negative.     Family History  Problem Relation Age of Onset  . Cancer Father        back muscle   . Other Father        pituatary tumor  . Lung cancer Father   . Colon polyps Mother   . High blood pressure Brother   . Colon cancer Neg Hx   . Esophageal cancer Neg Hx   . Rectal cancer Neg Hx   . Stomach cancer Neg Hx     Social History   Socioeconomic History  . Marital status: Married    Spouse name: Not on file  . Number of children: 1  . Years of  education: Not on file  . Highest education level: Not on file  Occupational History  . Occupation: retired    Fish farm manager: RETIRED  Social Needs  . Financial resource strain: Not on file  . Food insecurity:    Worry: Not on file    Inability: Not on file  . Transportation needs:    Medical: Not on file    Non-medical: Not on file  Tobacco Use  . Smoking status: Former Smoker    Years: 20.00    Types: Cigarettes    Last attempt to quit: 11/06/1970    Years since quitting: 47.5  . Smokeless tobacco: Never Used  Substance and Sexual Activity  . Alcohol use: Not Currently  . Drug use: No  . Sexual activity: Yes  Lifestyle  . Physical activity:    Days per week: Not on file    Minutes per session: Not on file  . Stress: Not on file  Relationships  . Social connections:    Talks on phone: Not on file    Gets together: Not on file    Attends religious service: Not on file    Active member of club or organization: Not on file    Attends meetings of clubs or organizations: Not on file    Relationship status: Not on file  . Intimate partner violence:    Fear of current or ex partner: Not on file    Emotionally abused: Not on file    Physically abused: Not on file    Forced sexual activity: Not on file  Other Topics Concern  . Not on file  Social History Narrative  . Not on file    Environmental and Social history  Lives in a house with a dry environment, a dog located inside the household, carpet in the bedroom, no plastic on the bed, no plastic on the pillow, and no smokers located inside the household.  Objective:   Vitals:   05/02/18 1401  BP: (!) 150/82  Pulse: 84  Resp: 16  Temp: 98.1 F (36.7 C)   Height: 6\' 1"  (185.4 cm) Weight: 170 lb 12.8 oz (77.5 kg)  Physical Exam  HENT:  Head: Normocephalic. Head is without right periorbital erythema and without left periorbital erythema.  Right Ear: Tympanic membrane, external ear and ear canal normal.  Left Ear:  Tympanic membrane, external ear and ear canal normal.  Nose: Nose normal. No mucosal edema or rhinorrhea.  Mouth/Throat: Oropharynx is clear and moist and mucous membranes are normal. No oropharyngeal exudate.  Eyes: Pupils are equal, round, and reactive to light. Conjunctivae and lids are  normal.  Neck: Trachea normal. No tracheal deviation present. No thyromegaly present.  Cardiovascular: Normal rate, regular rhythm, S1 normal, S2 normal and normal heart sounds.  No murmur heard. Pulmonary/Chest: Effort normal. No stridor. No respiratory distress. He has no wheezes. He has no rales. He exhibits no tenderness.  Abdominal: Soft. He exhibits no distension and no mass. There is no hepatosplenomegaly. There is no tenderness. There is no rebound and no guarding.  Musculoskeletal: He exhibits no edema or tenderness.  Lymphadenopathy:       Head (right side): No tonsillar adenopathy present.       Head (left side): No tonsillar adenopathy present.    He has no cervical adenopathy.    He has no axillary adenopathy.  Neurological: He is alert.  Skin: No rash noted. He is not diaphoretic. No erythema. No pallor. Nails show no clubbing.    Diagnostics: Allergy skin tests were not performed secondary to recent use of antihistamine.  Assessment and Plan:    1. Idiopathic urticaria   2. Dermatographia     1.  Depomedrol 80mg  IM delivered in clinic today  2.  Use the following medications every day:   A.  Cetirizine 10 mg tablet 1 time per day  B.  Famotidine 20 mg tablet 1 time per day  3.  Can add OTC Benadryl if needed  4.  Blood - CBC w/diff, CMP, SED, T4, TSH, TP  5. Further evaluation? Yes, if recurrent reactions  6. Return in 3 weeks or earlier if problem  Buddy has immunological hyperreactivity that has developed over the course of the past several weeks and we will screen his blood for a worrisome systemic disease contributing to this issue.  As well, I gave him a systemic  steroid today and I would like for him to consistently use an H1 and H2 receptor blocker for the next 3 weeks until I can see him back in this clinic.  He does appear to have heartburn which is a long-standing issue and he should get some relief from that as well while using his H2 receptor blocker.  Jiles Prows, MD Allergy / [facility]

## 2018-05-03 DIAGNOSIS — L501 Idiopathic urticaria: Secondary | ICD-10-CM | POA: Diagnosis not present

## 2018-05-04 LAB — TSH+FREE T4
Free T4: 1.1 ng/dL (ref 0.82–1.77)
TSH: 2.7 u[IU]/mL (ref 0.450–4.500)

## 2018-05-04 LAB — COMPREHENSIVE METABOLIC PANEL
ALT: 11 IU/L (ref 0–44)
AST: 16 IU/L (ref 0–40)
Albumin/Globulin Ratio: 1.8 (ref 1.2–2.2)
Albumin: 4.4 g/dL (ref 3.5–4.8)
Alkaline Phosphatase: 89 IU/L (ref 39–117)
BUN/Creatinine Ratio: 19 (ref 10–24)
BUN: 16 mg/dL (ref 8–27)
Bilirubin Total: 0.5 mg/dL (ref 0.0–1.2)
CO2: 28 mmol/L (ref 20–29)
Calcium: 9.8 mg/dL (ref 8.6–10.2)
Chloride: 102 mmol/L (ref 96–106)
Creatinine, Ser: 0.86 mg/dL (ref 0.76–1.27)
GFR calc Af Amer: 97 mL/min/{1.73_m2} (ref >59)
GFR calc non Af Amer: 84 mL/min/{1.73_m2} (ref >59)
Globulin, Total: 2.5 g/dL (ref 1.5–4.5)
Glucose: 76 mg/dL (ref 65–99)
Potassium: 4.2 mmol/L (ref 3.5–5.2)
Sodium: 143 mmol/L (ref 134–144)
Total Protein: 6.9 g/dL (ref 6.0–8.5)

## 2018-05-04 LAB — CBC WITH DIFFERENTIAL/PLATELET
Basophils Absolute: 0 10*3/uL (ref 0.0–0.2)
Basos: 1 %
EOS (ABSOLUTE): 0.2 10*3/uL (ref 0.0–0.4)
Eos: 3 %
Hematocrit: 43.2 % (ref 37.5–51.0)
Hemoglobin: 14.8 g/dL (ref 13.0–17.7)
Immature Grans (Abs): 0 10*3/uL (ref 0.0–0.1)
Immature Granulocytes: 0 %
Lymphocytes Absolute: 1.4 10*3/uL (ref 0.7–3.1)
Lymphs: 30 %
MCH: 29.8 pg (ref 26.6–33.0)
MCHC: 34.3 g/dL (ref 31.5–35.7)
MCV: 87 fL (ref 79–97)
Monocytes Absolute: 0.6 10*3/uL (ref 0.1–0.9)
Monocytes: 13 %
Neutrophils Absolute: 2.4 10*3/uL (ref 1.4–7.0)
Neutrophils: 53 %
Platelets: 286 10*3/uL (ref 150–450)
RBC: 4.96 x10E6/uL (ref 4.14–5.80)
RDW: 14.2 % (ref 12.3–15.4)
WBC: 4.6 10*3/uL (ref 3.4–10.8)

## 2018-05-04 LAB — SEDIMENTATION RATE: Sed Rate: 9 mm/hr (ref 0–30)

## 2018-05-04 LAB — THYROID PEROXIDASE ANTIBODY: Thyroperoxidase Ab SerPl-aCnc: 11 IU/mL (ref 0–34)

## 2018-05-06 ENCOUNTER — Encounter: Payer: Self-pay | Admitting: Allergy and Immunology

## 2018-05-23 ENCOUNTER — Encounter: Payer: Self-pay | Admitting: Allergy and Immunology

## 2018-05-23 ENCOUNTER — Ambulatory Visit (INDEPENDENT_AMBULATORY_CARE_PROVIDER_SITE_OTHER): Payer: PPO | Admitting: Allergy and Immunology

## 2018-05-23 VITALS — BP 148/80 | HR 82 | Resp 18

## 2018-05-23 DIAGNOSIS — L501 Idiopathic urticaria: Secondary | ICD-10-CM

## 2018-05-23 NOTE — Progress Notes (Signed)
Follow-up Note  Referring Provider: [name], MD Primary Provider: [name], MD Date of Office Visit: 05/23/2018  Subjective:   [name] (DOB: 1940-12-09) is a 77 y.o. male who returns to the [facility] on 05/23/2018 in re-evaluation of the following:  HPI: Buddy returns to this clinic in evaluation of hives.  I last saw him in his clinic on 02 May 2017 during his initial evaluation.  He is much better.  He only has an occasional urticarial lesion every other day that lasts an hour or so.  He did have some problem using cetirizine with his ability to urinate and he is now using Benadryl at 50 mg twice a day in conjunction with his famotidine.  Allergies as of 05/23/2018      Reactions   Wellbutrin [bupropion Hcl] Rash      Medication List      ALPRAZolam 0.5 MG tablet Commonly known as:  XANAX Take 1 tablet (0.5 mg total) by mouth at bedtime as needed for anxiety.   BENADRYL PO Take by mouth as needed.   famotidine 20 MG tablet Commonly known as:  PEPCID Take 1 tablet (20 mg total) by mouth daily.   ketorolac 0.5 % ophthalmic solution Commonly known as:  ACULAR INSTILL 1 DROP INTO LEFT EYE 4 TIMES A DAY   LORazepam 0.5 MG tablet Commonly known as:  ATIVAN Take 1 tablet (0.5 mg total) at bedtime by mouth.   zolpidem 10 MG tablet Commonly known as:  AMBIEN Take 1 tablet (10 mg total) by mouth at bedtime as needed for sleep.       Past Medical History:  Diagnosis Date  . Allergy   . BPH (benign prostatic hypertrophy)   . Eczema   . Hyperlipidemia   . Peyronie's disease     Past Surgical History:  Procedure Laterality Date  . COLONOSCOPY    . EYE SURGERY Bilateral 06/08   retinal detachment OS  . EYE SURGERY Bilateral 1994   cataract OU  . TONSILLECTOMY      Review of systems negative except as noted in HPI / PMHx or noted below:  Review of Systems  Constitutional: Negative.   HENT: Negative.   Eyes: Negative.    Respiratory: Negative.   Cardiovascular: Negative.   Gastrointestinal: Negative.   Genitourinary: Negative.   Musculoskeletal: Negative.   Skin: Negative.   Neurological: Negative.   Endo/Heme/Allergies: Negative.   Psychiatric/Behavioral: Negative.      Objective:   Vitals:   05/23/18 1506  BP: (!) 148/80  Pulse: 82  Resp: 18  SpO2: 97%          Physical Exam  Skin: No rash (No urticaria) noted.    Diagnostics:   Results of blood tests obtained 03 May 2018 identified WBC 4.6, absolute eosinophil 200, absolute lymphocyte 1400, hemoglobin 14.8, platelet 286, normal hepatic and renal function, TSH 2.70 IU/mL, free T4 1.10 NG/DL, thyroid peroxidase antibody 11 IU/mL  Assessment and Plan:   1. Idiopathic urticaria     1.  Use the following medications every day:   A.  Diphenhydramine 25-50 2 times a day  B.  Famotidine 20 mg tablet 1 time per day  2.  Can add OTC Benadryl if needed  3.  Return in 8 weeks or earlier if problem  Vonna Drafts is slowly improving on his current medical therapy and I would like for him to continue to use this plan for a  full 12 weeks and thus I will see him back in his clinic in 8 weeks.  Should he have recrudescence of this issue as he moves forward he will contact me for further evaluation and treatment.  Allena Katz, MD Allergy / Immunology [facility]

## 2018-05-23 NOTE — Patient Instructions (Addendum)
  1.  Use the following medications every day:   A.  Diphenhydramine 25-50 2 times a day  B.  Famotidine 20 mg tablet 1 time per day  2.  Can add OTC Benadryl if needed  3.  Return in 8 weeks or earlier if problem

## 2018-05-27 ENCOUNTER — Encounter: Payer: Self-pay | Admitting: Allergy and Immunology

## 2018-06-26 DIAGNOSIS — H35071 Retinal telangiectasis, right eye: Secondary | ICD-10-CM | POA: Diagnosis not present

## 2018-06-26 DIAGNOSIS — H35352 Cystoid macular degeneration, left eye: Secondary | ICD-10-CM | POA: Diagnosis not present

## 2018-06-26 DIAGNOSIS — H35072 Retinal telangiectasis, left eye: Secondary | ICD-10-CM | POA: Diagnosis not present

## 2018-06-26 DIAGNOSIS — H21222 Degeneration of ciliary body, left eye: Secondary | ICD-10-CM | POA: Diagnosis not present

## 2018-07-15 ENCOUNTER — Encounter: Payer: Self-pay | Admitting: Allergy and Immunology

## 2018-07-15 ENCOUNTER — Ambulatory Visit (INDEPENDENT_AMBULATORY_CARE_PROVIDER_SITE_OTHER): Payer: PPO | Admitting: Allergy and Immunology

## 2018-07-15 VITALS — BP 132/84 | HR 84 | Resp 20

## 2018-07-15 DIAGNOSIS — L501 Idiopathic urticaria: Secondary | ICD-10-CM | POA: Diagnosis not present

## 2018-07-15 NOTE — Progress Notes (Signed)
Follow-up Note  Referring Provider: [name], MD Primary Provider: [name], MD Date of Office Visit: 07/15/2018  Subjective:   [name] (DOB: 08-06-1941) is a 77 y.o. male who returns to the [facility] on 07/15/2018 in re-evaluation of the following:  HPI: Buddy returns to this clinic in evaluation of her urticaria.  His last visit to this clinic was 23 May 2018.  Overall he is improving significantly as he moves forward with the use of an H1 and H2 receptor blocker.  He has restarted his cetirizine as he no longer has any problems with urination when utilizing this antihistamine and he no longer needs to use any diphenhydramine as a replacement.  He does continue to use famotidine.  He is using his cetirizine only one time per day and has been using famotidine 1 time per day.  He has not been having any urticaria.  Allergies as of 07/15/2018      Reactions   Wellbutrin [bupropion Hcl] Rash      Medication List      ALPRAZolam 0.5 MG tablet Commonly known as:  XANAX Take 1 tablet (0.5 mg total) by mouth at bedtime as needed for anxiety.   cetirizine 10 MG tablet Commonly known as:  ZYRTEC Take 10 mg by mouth daily.   famotidine 20 MG tablet Commonly known as:  PEPCID Take 1 tablet (20 mg total) by mouth daily.   ketorolac 0.5 % ophthalmic solution Commonly known as:  ACULAR INSTILL 1 DROP INTO LEFT EYE 4 TIMES A DAY   LORazepam 0.5 MG tablet Commonly known as:  ATIVAN Take 1 tablet (0.5 mg total) at bedtime by mouth.   zolpidem 10 MG tablet Commonly known as:  AMBIEN Take 1 tablet (10 mg total) by mouth at bedtime as needed for sleep.       Past Medical History:  Diagnosis Date  . Allergy   . BPH (benign prostatic hypertrophy)   . Eczema   . Hyperlipidemia   . Peyronie's disease   . Urticaria     Past Surgical History:  Procedure Laterality Date  . COLONOSCOPY    . EYE SURGERY Bilateral 06/08   retinal detachment  OS  . EYE SURGERY Bilateral 1994   cataract OU  . TONSILLECTOMY      Review of systems negative except as noted in HPI / PMHx or noted below:  Review of Systems  Constitutional: Negative.   HENT: Negative.   Eyes: Negative.   Respiratory: Negative.   Cardiovascular: Negative.   Gastrointestinal: Negative.   Genitourinary: Negative.   Musculoskeletal: Negative.   Skin: Negative.   Neurological: Negative.   Endo/Heme/Allergies: Negative.   Psychiatric/Behavioral: Negative.      Objective:   Vitals:   07/15/18 1129  BP: 132/84  Pulse: 84  Resp: 20          Physical Exam  Skin: No rash (No urticaria) noted.    Diagnostics: none  Assessment and Plan:   1. Idiopathic urticaria     1.  Use the following medications every day:   A.  Cetirizine 10 mg tablet 1 time per day  B.  Famotidine 20 mg tablet 1 time per day  2.  Can add OTC Benadryl if needed  3.  Return if problem  4.  Obtain fall flu vaccine  Vonna Drafts is doing quite well and it does appear as though his immunological hyperreactivity is slowly burning out.  I  have asked him to utilize an H1 and H2 receptor blocker in blocks of 3 months and if he has had no urticaria activity at all in a 27-month period of time that he can discontinue these medications to see if this medication requirement is still present.  Of course, should his pattern change in the future he is certainly welcome to return to this clinic for further evaluation and treatment.  Allena Katz, MD Allergy / Immunology [facility]

## 2018-07-15 NOTE — Patient Instructions (Signed)
  1.  Use the following medications every day:   A.  Cetirizine 10 mg tablet 1 time per day  B.  Famotidine 20 mg tablet 1 time per day  2.  Can add OTC Benadryl if needed  3.  Return if problem  4.  Obtain fall flu vaccine

## 2018-07-16 ENCOUNTER — Encounter: Payer: Self-pay | Admitting: Allergy and Immunology

## 2018-08-01 ENCOUNTER — Telehealth: Payer: Self-pay | Admitting: Allergy and Immunology

## 2018-08-01 NOTE — Telephone Encounter (Signed)
Patient informed. 

## 2018-08-01 NOTE — Telephone Encounter (Signed)
Buddy called in and just wanted to consult with Dr. Neldon Mc and see if it was ok for him to obtain the Shingles vaccine.  Please advise.

## 2018-08-01 NOTE — Telephone Encounter (Signed)
Please inform patient that he can obtain the shingles vaccine and flu vaccine

## 2018-10-16 ENCOUNTER — Ambulatory Visit (INDEPENDENT_AMBULATORY_CARE_PROVIDER_SITE_OTHER): Payer: PPO | Admitting: Internal Medicine

## 2018-10-16 DIAGNOSIS — Z23 Encounter for immunization: Secondary | ICD-10-CM | POA: Diagnosis not present

## 2018-10-16 NOTE — Patient Instructions (Signed)
Patient received a flu vaccine IM L deltoid, AV, CMA  

## 2018-11-02 NOTE — Progress Notes (Signed)
Flu vaccine given by CMA 

## 2018-11-10 ENCOUNTER — Encounter: Payer: Self-pay | Admitting: Gastroenterology

## 2018-11-17 ENCOUNTER — Other Ambulatory Visit: Payer: Self-pay | Admitting: Allergy and Immunology

## 2018-11-19 ENCOUNTER — Other Ambulatory Visit: Payer: PPO | Admitting: Internal Medicine

## 2018-11-19 DIAGNOSIS — F411 Generalized anxiety disorder: Secondary | ICD-10-CM

## 2018-11-19 DIAGNOSIS — Z0000 Encounter for general adult medical examination without abnormal findings: Secondary | ICD-10-CM

## 2018-11-19 DIAGNOSIS — E782 Mixed hyperlipidemia: Secondary | ICD-10-CM | POA: Diagnosis not present

## 2018-11-19 DIAGNOSIS — N400 Enlarged prostate without lower urinary tract symptoms: Secondary | ICD-10-CM | POA: Diagnosis not present

## 2018-11-19 LAB — CBC WITH DIFFERENTIAL/PLATELET
Absolute Monocytes: 436 cells/uL (ref 200–950)
Basophils Absolute: 39 cells/uL (ref 0–200)
Basophils Relative: 0.8 %
Eosinophils Absolute: 118 cells/uL (ref 15–500)
Eosinophils Relative: 2.4 %
HCT: 44.7 % (ref 38.5–50.0)
Hemoglobin: 15.2 g/dL (ref 13.2–17.1)
Lymphs Abs: 1519 cells/uL (ref 850–3900)
MCH: 29.7 pg (ref 27.0–33.0)
MCHC: 34 g/dL (ref 32.0–36.0)
MCV: 87.5 fL (ref 80.0–100.0)
MPV: 10 fL (ref 7.5–12.5)
Monocytes Relative: 8.9 %
Neutro Abs: 2788 cells/uL (ref 1500–7800)
Neutrophils Relative %: 56.9 %
Platelets: 366 10*3/uL (ref 140–400)
RBC: 5.11 10*6/uL (ref 4.20–5.80)
RDW: 12.5 % (ref 11.0–15.0)
Total Lymphocyte: 31 %
WBC: 4.9 10*3/uL (ref 3.8–10.8)

## 2018-11-19 LAB — COMPLETE METABOLIC PANEL WITH GFR
AG Ratio: 1.5 (calc) (ref 1.0–2.5)
ALT: 15 U/L (ref 9–46)
AST: 20 U/L (ref 10–35)
Albumin: 4.1 g/dL (ref 3.6–5.1)
Alkaline phosphatase (APISO): 76 U/L (ref 40–115)
BUN: 13 mg/dL (ref 7–25)
CO2: 31 mmol/L (ref 20–32)
Calcium: 9.5 mg/dL (ref 8.6–10.3)
Chloride: 102 mmol/L (ref 98–110)
Creat: 0.83 mg/dL (ref 0.70–1.18)
GFR, Est African American: 99 mL/min/{1.73_m2} (ref >=60)
GFR, Est Non African American: 85 mL/min/{1.73_m2} (ref >=60)
Globulin: 2.8 g/dL (calc) (ref 1.9–3.7)
Glucose, Bld: 84 mg/dL (ref 65–99)
Potassium: 4.6 mmol/L (ref 3.5–5.3)
Sodium: 141 mmol/L (ref 135–146)
Total Bilirubin: 0.4 mg/dL (ref 0.2–1.2)
Total Protein: 6.9 g/dL (ref 6.1–8.1)

## 2018-11-19 LAB — LIPID PANEL
Cholesterol: 221 mg/dL — ABNORMAL HIGH (ref <200)
HDL: 40 mg/dL — ABNORMAL LOW (ref >40)
LDL Cholesterol (Calc): 149 mg/dL (calc) — ABNORMAL HIGH
Non-HDL Cholesterol (Calc): 181 mg/dL (calc) — ABNORMAL HIGH (ref <130)
Total CHOL/HDL Ratio: 5.5 (calc) — ABNORMAL HIGH (ref <5.0)
Triglycerides: 181 mg/dL — ABNORMAL HIGH (ref <150)

## 2018-11-19 LAB — PSA: PSA: 4.6 ng/mL — ABNORMAL HIGH (ref <=4.0)

## 2018-11-22 ENCOUNTER — Ambulatory Visit (INDEPENDENT_AMBULATORY_CARE_PROVIDER_SITE_OTHER): Payer: PPO | Admitting: Internal Medicine

## 2018-11-22 ENCOUNTER — Encounter: Payer: Self-pay | Admitting: Internal Medicine

## 2018-11-22 VITALS — BP 140/80 | HR 81 | Temp 98.2°F | Ht 73.0 in | Wt 172.0 lb

## 2018-11-22 DIAGNOSIS — N400 Enlarged prostate without lower urinary tract symptoms: Secondary | ICD-10-CM | POA: Diagnosis not present

## 2018-11-22 DIAGNOSIS — R030 Elevated blood-pressure reading, without diagnosis of hypertension: Secondary | ICD-10-CM | POA: Diagnosis not present

## 2018-11-22 DIAGNOSIS — Z0000 Encounter for general adult medical examination without abnormal findings: Secondary | ICD-10-CM | POA: Diagnosis not present

## 2018-11-22 DIAGNOSIS — Z8659 Personal history of other mental and behavioral disorders: Secondary | ICD-10-CM

## 2018-11-22 DIAGNOSIS — E782 Mixed hyperlipidemia: Secondary | ICD-10-CM

## 2018-11-22 DIAGNOSIS — N486 Induration penis plastica: Secondary | ICD-10-CM | POA: Diagnosis not present

## 2018-11-22 DIAGNOSIS — N529 Male erectile dysfunction, unspecified: Secondary | ICD-10-CM

## 2018-11-22 LAB — POCT URINALYSIS DIPSTICK
Appearance: NEGATIVE
Bilirubin, UA: NEGATIVE
Blood, UA: NEGATIVE
Glucose, UA: NEGATIVE
Ketones, UA: NEGATIVE
Leukocytes, UA: NEGATIVE
Nitrite, UA: NEGATIVE
Odor: NEGATIVE
Protein, UA: NEGATIVE
Spec Grav, UA: 1.015 (ref 1.010–1.025)
Urobilinogen, UA: 0.2 E.U./dL
pH, UA: 6.5 (ref 5.0–8.0)

## 2018-12-06 ENCOUNTER — Encounter: Payer: Self-pay | Admitting: Internal Medicine

## 2018-12-06 NOTE — Progress Notes (Signed)
Subjective:    Patient ID: [name], male    DOB: 1941-08-14, 78 y.o.   MRN: [id number]  HPI 78 year old white male in today for Medicare wellness exam, health maintenance exam and evaluation of medical issues.  He has a history of anxiety for which he takes Xanax sparingly.  Sometimes has insomnia.  His general health is good.  History of erectile dysfunction treated with Viagra by Dr. Rosana Hoes.  History of elevated PSA in 2017 at 6.40 and he sees Dr. Rosana Hoes regularly.   History history of Peyronie's disease.  History of hyperlipidemia which is been diet controlled.  Social history: He is a widower.  Wife died of complications of COPD.  Occasionally is sexually active but not very much.  He has a lady friend.  Retired but formerly worked as a Librarian, academic for [employer].  He enjoys life and enjoys spending time outdoors.  Likes to fish.  Son was killed in a motor vehicle accident many years ago before his wife died.  Does not smoke.  No other children.  Past medical history: Tonsillectomy 1949, bilateral cataract extraction 1994.  Surgery for retinal detachment of the left eye by Dr. Zadie Rhine June 2001.  Colonoscopy done March 2005.  History of positional vertigo in the past with an episode of cervical radiculopathy.  History of negative exercise tolerance test 1999.  Had tetanus immunization 2015.  Had Pneumovax 23 in 2012 and Prevnar vaccine in 2018.    Review of Systems  Constitutional: Negative.   All other systems reviewed and are negative.      Objective:   Physical Exam Vitals signs reviewed.  Constitutional:      General: He is not in acute distress.    Appearance: Normal appearance. He is not ill-appearing.  HENT:     Head: Normocephalic and atraumatic.     Right Ear: Tympanic membrane and external ear normal.     Left Ear: Tympanic membrane and external ear normal.     Nose: Nose normal.     Mouth/Throat:     Mouth: Mucous membranes are moist.     Pharynx: Oropharynx is  clear.  Eyes:     General: No scleral icterus.    Extraocular Movements: Extraocular movements intact.     Conjunctiva/sclera: Conjunctivae normal.     Pupils: Pupils are equal, round, and reactive to light.  Neck:     Musculoskeletal: Neck supple.     Vascular: No carotid bruit.     Comments: No thyromegaly Cardiovascular:     Rate and Rhythm: Normal rate and regular rhythm.     Pulses: Normal pulses.     Heart sounds: No murmur.  Pulmonary:     Effort: Pulmonary effort is normal. No respiratory distress.     Breath sounds: Normal breath sounds. No wheezing or rales.  Abdominal:     Palpations: Abdomen is soft. There is no mass.     Tenderness: There is no guarding or rebound.  Genitourinary:    Comments: Deferred to urologist Musculoskeletal:        General: No deformity.     Right lower leg: No edema.     Left lower leg: No edema.  Lymphadenopathy:     Cervical: No cervical adenopathy.  Skin:    General: Skin is warm and dry.     Coloration: Skin is not pale.  Neurological:     General: No focal deficit present.     Mental Status: He is alert and  oriented to person, place, and time.     Cranial Nerves: No cranial nerve deficit.     Sensory: No sensory deficit.     Coordination: Coordination normal.     Gait: Gait normal.  Psychiatric:        Mood and Affect: Mood normal.        Behavior: Behavior normal.        Thought Content: Thought content normal.        Judgment: Judgment normal.           Assessment & Plan:  Normal health maintenance exam  History of erectile dysfunction treated by Dr. Rosana Hoes  History of Peyronie's disease  History of elevated PSA followed by urologist  History of anxiety treated sparingly with Xanax  Hyperlipidemia-total cholesterol 221, HDL cholesterol 40, triglycerides 181 and LDL cholesterol 149.  In 2018 total cholesterol was 239, triglycerides 161, HDL 54 and LDL cholesterol 153.  He does not want to be on statin therapy.   Needs to watch diet and exercise.  Elevated blood pressure reading-to return in late February with blood pressure readings from home  Subjective:   Patient presents for Medicare Annual/Subsequent preventive examination.  Review Past Medical/Family/Social: See above   Risk Factors  Current exercise habits: Active about home and yard Dietary issues discussed: Low-fat low carbohydrate  Cardiac risk factors: Hyperlipidemia  Depression Screen  (Note: if answer to either of the following is "Yes", a more complete depression screening is indicated)   Over the past two weeks, have you felt down, depressed or hopeless? No  Over the past two weeks, have you felt little interest or pleasure in doing things? No Have you lost interest or pleasure in daily life? No Do you often feel hopeless? No Do you cry easily over simple problems? No   Activities of Daily Living  In your present state of health, do you have any difficulty performing the following activities?:   Driving? No  Managing money? No  Feeding yourself? No  Getting from bed to chair? No  Climbing a flight of stairs? No  Preparing food and eating?: No  Bathing or showering? No  Getting dressed: No  Getting to the toilet? No  Using the toilet:No  Moving around from place to place: No  In the past year have you fallen or had a near fall?:No  Are you sexually active? No  Do you have more than one partner? No   Hearing Difficulties: No  Do you often ask people to speak up or repeat themselves? No  Do you experience ringing or noises in your ears? No  Do you have difficulty understanding soft or whispered voices? No  Do you feel that you have a problem with memory? No Do you often misplace items? No    Home Safety:  Do you have a smoke alarm at your residence? Yes Do you have grab bars in the bathroom?  Yes Do you have throw rugs in your house?  No   Cognitive Testing  Alert? Yes Normal Appearance?Yes  Oriented to  person? Yes Place? Yes  Time? Yes  Recall of three objects? Yes  Can perform simple calculations? Yes  Displays appropriate judgment?Yes  Can read the correct time from a watch face?Yes   List the Names of Other Physician/Practitioners you currently use:  See referral list for the physicians patient is currently seeing.     Review of Systems: See above   Objective:     General appearance:  Appears stated age  Head: Normocephalic, without obvious abnormality, atraumatic  Eyes: conj clear, EOMi PEERLA  Ears: normal TM's and external ear canals both ears  Nose: Nares normal. Septum midline. Mucosa normal. No drainage or sinus tenderness.  Throat: lips, mucosa, and tongue normal; teeth and gums normal  Neck: no adenopathy, no carotid bruit, no JVD, supple, symmetrical, trachea midline and thyroid not enlarged, symmetric, no tenderness/mass/nodules  No CVA tenderness.  Lungs: clear to auscultation bilaterally  Breasts: normal appearance, no masses or tenderness Heart: regular rate and rhythm, S1, S2 normal, no murmur, click, rub or gallop  Abdomen: soft, non-tender; bowel sounds normal; no masses, no organomegaly  Musculoskeletal: ROM normal in all joints, no crepitus, no deformity, Normal muscle strengthen. Back  is symmetric, no curvature. Skin: Skin color, texture, turgor normal. No rashes or lesions  Lymph nodes: Cervical, supraclavicular, and axillary nodes normal.  Neurologic: CN 2 -12 Normal, Normal symmetric reflexes. Normal coordination and gait  Psych: Alert & Oriented x 3, Mood appear stable.    Assessment:    Annual wellness medicare exam   Plan:    During the course of the visit the patient was educated and counseled about appropriate screening and preventive services including:   Recommend annual flu vaccine  Pneumococcal and Prevnar 13 vaccines are up-to-date     Patient Instructions (the written plan) was given to the patient.  Medicare Attestation  I  have personally reviewed:  The patient's medical and social history  Their use of alcohol, tobacco or illicit drugs  Their current medications and supplements  The patient's functional ability including ADLs,fall risks, home safety risks, cognitive, and hearing and visual impairment  Diet and physical activities  Evidence for depression or mood disorders  The patient's weight, height, BMI, and visual acuity have been recorded in the chart. I have made referrals, counseling, and provided education to the patient based on review of the above and I have provided the patient with a written personalized care plan for preventive services.

## 2018-12-06 NOTE — Patient Instructions (Addendum)
It was a pleasure to see you today.  Please work on diet and get more exercise.  Cholesterol and triglycerides are elevated.  Return in 1 year or as needed.  Patient does not want to be on statin therapy.  Continue follow-up with urologist.

## 2019-01-03 ENCOUNTER — Encounter: Payer: Self-pay | Admitting: Internal Medicine

## 2019-01-03 ENCOUNTER — Ambulatory Visit (INDEPENDENT_AMBULATORY_CARE_PROVIDER_SITE_OTHER): Payer: PPO | Admitting: Internal Medicine

## 2019-01-03 VITALS — BP 130/70 | HR 80 | Temp 98.6°F | Ht 73.0 in | Wt 172.0 lb

## 2019-01-03 DIAGNOSIS — R0989 Other specified symptoms and signs involving the circulatory and respiratory systems: Secondary | ICD-10-CM

## 2019-01-03 DIAGNOSIS — E782 Mixed hyperlipidemia: Secondary | ICD-10-CM | POA: Diagnosis not present

## 2019-01-03 DIAGNOSIS — N400 Enlarged prostate without lower urinary tract symptoms: Secondary | ICD-10-CM | POA: Diagnosis not present

## 2019-01-03 MED ORDER — TAMSULOSIN HCL 0.4 MG PO CAPS: 0.4000 mg | ORAL_CAPSULE | Freq: Every day | ORAL | 5 refills | Status: DC

## 2019-01-03 NOTE — Patient Instructions (Signed)
Continue to watch diet and get some exercise.  Follow-up with office visit lipid panel in July.  Continue to monitor blood pressure at home.  No medications prescribed today except Flomax for BPH.

## 2019-01-03 NOTE — Progress Notes (Signed)
   Subjective:    Patient ID: [name], male    DOB: Apr 03, 1941, 78 y.o.   MRN: [id number]  HPI 78 year old Male in today to follow-up on elevated blood pressure at last visit.  He brings in multiple blood pressure readings over 4 weeks.  Blood pressure readings range from 1 03-500 systolically and 93-81 diastolically.  He feels well.  Has no new complaints.  Today his blood pressure is excellent at 130/70.      Review of Systems see above-would like to have prescription for Flomax for BPH.  This was prescribed.     Objective:   Physical Exam Blood pressure 130/70.  Pulse 80 regular.  Weight 172 pounds.  BMI 22.69.  Chest clear.  Cardiac exam regular rate and rhythm.  Extremities without edema.       Assessment & Plan:  Blood pressure readings from home are very acceptable and will continue to monitor blood pressure at home.  Hyperlipidemia-total cholesterol 221, HDL cholesterol 40, triglycerides 181, LDL cholesterol 149 done during physical exam January 2020.  We discussed low-dose Crestor 3 times weekly but he does not want to be on that at this point in time.  He agrees to come back in the summer perhaps late July and have repeat lipid panel and office visit.  BPH- PSA 4.6 in January.  Urine specimen was normal.  Plan: Follow-up in late July with fasting lipid panel and office visit as well as blood pressure check.

## 2019-01-06 DIAGNOSIS — R972 Elevated prostate specific antigen [PSA]: Secondary | ICD-10-CM | POA: Diagnosis not present

## 2019-01-06 DIAGNOSIS — N138 Other obstructive and reflux uropathy: Secondary | ICD-10-CM | POA: Diagnosis not present

## 2019-01-06 DIAGNOSIS — N401 Enlarged prostate with lower urinary tract symptoms: Secondary | ICD-10-CM | POA: Diagnosis not present

## 2019-01-06 DIAGNOSIS — N528 Other male erectile dysfunction: Secondary | ICD-10-CM | POA: Diagnosis not present

## 2019-01-08 ENCOUNTER — Encounter: Payer: Self-pay | Admitting: Gastroenterology

## 2019-02-10 ENCOUNTER — Encounter: Payer: PPO | Admitting: Gastroenterology

## 2019-02-17 ENCOUNTER — Other Ambulatory Visit: Payer: Self-pay

## 2019-02-18 ENCOUNTER — Ambulatory Visit (AMBULATORY_SURGERY_CENTER): Payer: PPO | Admitting: *Deleted

## 2019-02-18 ENCOUNTER — Other Ambulatory Visit: Payer: Self-pay

## 2019-02-18 VITALS — Ht 74.0 in | Wt 170.0 lb

## 2019-02-18 DIAGNOSIS — Z8371 Family history of colonic polyps: Secondary | ICD-10-CM

## 2019-02-18 MED ORDER — PEG 3350-KCL-NA BICARB-NACL 420 G PO SOLR: 4000.0000 mL | Freq: Once | ORAL | 0 refills | Status: AC

## 2019-02-18 NOTE — Progress Notes (Signed)
No egg or soy allergy known to patient  No issues with past sedation with any surgeries  or procedures, no intubation problems  No diet pills per patient No home 02 use per patient  No blood thinners per patient  Pt denies issues with constipation  No A fib or A flutter  EMMI video sent to pt's e mail  Instructions reviewed via phone Informed pt. That instructions will be mailed

## 2019-02-23 ENCOUNTER — Other Ambulatory Visit: Payer: Self-pay | Admitting: Allergy and Immunology

## 2019-02-24 ENCOUNTER — Other Ambulatory Visit: Payer: Self-pay | Admitting: Allergy and Immunology

## 2019-02-24 MED ORDER — FAMOTIDINE 20 MG PO TABS: 20.0000 mg | ORAL_TABLET | Freq: Every day | ORAL | 4 refills | Status: AC

## 2019-02-24 NOTE — Telephone Encounter (Signed)
Why did he change from pepcid? Did he have a side effect or was there an availiblility problem?

## 2019-02-24 NOTE — Telephone Encounter (Signed)
Disregard, patient on famotidine 20 mg bid

## 2019-02-24 NOTE — Telephone Encounter (Signed)
Alternative request from pharmacy

## 2019-03-03 ENCOUNTER — Encounter: Payer: PPO | Admitting: Gastroenterology

## 2019-03-18 ENCOUNTER — Encounter: Payer: Self-pay | Admitting: Gastroenterology

## 2019-03-26 DIAGNOSIS — H35072 Retinal telangiectasis, left eye: Secondary | ICD-10-CM | POA: Diagnosis not present

## 2019-03-26 DIAGNOSIS — H35352 Cystoid macular degeneration, left eye: Secondary | ICD-10-CM | POA: Diagnosis not present

## 2019-03-26 DIAGNOSIS — H21222 Degeneration of ciliary body, left eye: Secondary | ICD-10-CM | POA: Diagnosis not present

## 2019-03-26 DIAGNOSIS — H35071 Retinal telangiectasis, right eye: Secondary | ICD-10-CM | POA: Diagnosis not present

## 2019-03-30 ENCOUNTER — Telehealth: Payer: Self-pay | Admitting: *Deleted

## 2019-03-30 NOTE — Telephone Encounter (Signed)
Attempted to call patient to complete pre procedure screening for Covid-19. No answer. LMOM.

## 2019-03-30 NOTE — Telephone Encounter (Signed)
2nd attempt to reach patient. No answer. LMOM.

## 2019-03-31 NOTE — Telephone Encounter (Signed)
Covid-19 travel screening questions  Have you traveled in the last 14 days? No If yes where?  Do you now or have you had a fever in the last 14 days? No  Do you have any respiratory symptoms of shortness of breath or cough now or in the last 14 days? No  Do you have any family members or close contacts with diagnosed or suspected Covid-19? No  Pt aware care partner to wait in the car in the parking lot and to wear a mask into the building.

## 2019-04-01 ENCOUNTER — Ambulatory Visit (AMBULATORY_SURGERY_CENTER): Payer: PPO | Admitting: Gastroenterology

## 2019-04-01 ENCOUNTER — Encounter: Payer: Self-pay | Admitting: Gastroenterology

## 2019-04-01 ENCOUNTER — Other Ambulatory Visit: Payer: Self-pay

## 2019-04-01 VITALS — BP 139/77 | HR 69 | Temp 97.3°F | Resp 14 | Ht 73.0 in | Wt 172.0 lb

## 2019-04-01 DIAGNOSIS — Z8371 Family history of colonic polyps: Secondary | ICD-10-CM

## 2019-04-01 DIAGNOSIS — Z1211 Encounter for screening for malignant neoplasm of colon: Secondary | ICD-10-CM | POA: Diagnosis not present

## 2019-04-01 DIAGNOSIS — Z86010 Personal history of colonic polyps: Secondary | ICD-10-CM | POA: Diagnosis not present

## 2019-04-01 MED ORDER — SODIUM CHLORIDE 0.9 % IV SOLN: 500.0000 mL | Freq: Once | INTRAVENOUS | Status: DC

## 2019-04-01 NOTE — Patient Instructions (Signed)
Eat a high fiber diet.  YOU HAD AN ENDOSCOPIC PROCEDURE TODAY AT [facility]:   Refer to the procedure report that was given to you for any specific questions about what was found during the examination.  If the procedure report does not answer your questions, please call your gastroenterologist to clarify.  If you requested that your care partner not be given the details of your procedure findings, then the procedure report has been included in a sealed envelope for you to review at your convenience later.  YOU SHOULD EXPECT: Some feelings of bloating in the abdomen. Passage of more gas than usual.  Walking can help get rid of the air that was put into your GI tract during the procedure and reduce the bloating. If you had a lower endoscopy (such as a colonoscopy or flexible sigmoidoscopy) you may notice spotting of blood in your stool or on the toilet paper. If you underwent a bowel prep for your procedure, you may not have a normal bowel movement for a few days.  Please Note:  You might notice some irritation and congestion in your nose or some drainage.  This is from the oxygen used during your procedure.  There is no need for concern and it should clear up in a day or so.  SYMPTOMS TO REPORT IMMEDIATELY:   Following lower endoscopy (colonoscopy or flexible sigmoidoscopy):  Excessive amounts of blood in the stool  Significant tenderness or worsening of abdominal pains  Swelling of the abdomen that is new, acute  Fever of 100F or higher   For urgent or emergent issues, a gastroenterologist can be reached at any hour by calling [phone].   DIET:  We do recommend a small meal at first, but then you may proceed to your regular diet.  Drink plenty of fluids but you should avoid alcoholic beverages for 24 hours.  ACTIVITY:  You should plan to take it easy for the rest of today and you should NOT DRIVE or use heavy machinery until tomorrow (because of the sedation medicines  used during the test).    FOLLOW UP: Our staff will call the number listed on your records 48-72 hours following your procedure to check on you and address any questions or concerns that you may have regarding the information given to you following your procedure. If we do not reach you, we will leave a message.  We will attempt to reach you two times.  During this call, we will ask if you have developed any symptoms of COVID 19. If you develop any symptoms (ie: fever, flu-like symptoms, shortness of breath, cough etc.) before then, please call [phone].  If you test positive for Covid 19 in the 2 weeks post procedure, please call and report this information to [place].    If any biopsies were taken you will be contacted by phone or by letter within the next 1-3 weeks.  Please call us at [phone] if you have not heard about the biopsies in 3 weeks.    SIGNATURES/CONFIDENTIALITY: You and/or your care partner have signed paperwork which will be entered into your electronic medical record.  These signatures attest to the fact that that the information above on your After Visit Summary has been reviewed and is understood.  Full responsibility of the confidentiality of this discharge information lies with you and/or your care-partner.

## 2019-04-01 NOTE — Progress Notes (Signed)
Pt's states no medical or surgical changes since previsit or office visit. 

## 2019-04-01 NOTE — Op Note (Signed)
[facility] Patient Name: [name] Procedure Date: 04/01/2019 11:11 AM MRN: [id number] Endoscopist: Ladene Artist , MD Age: 78 Referring MD:  Date of Birth: 1941/10/19 Gender: Male Account #: [id number] Procedure:                Colonoscopy Indications:              Colon cancer screening in patient at increased                            risk: Family history of 1st-degree relative with                            colon polyps Medicines:                Monitored Anesthesia Care Procedure:                Pre-Anesthesia Assessment:                           - Prior to the procedure, a History and Physical                            was performed, and patient medications and                            allergies were reviewed. The patient's tolerance of                            previous anesthesia was also reviewed. The risks                            and benefits of the procedure and the sedation                            options and risks were discussed with the patient.                            All questions were answered, and informed consent                            was obtained. Prior Anticoagulants: The patient has                            taken no previous anticoagulant or antiplatelet                            agents. ASA Grade Assessment: II - A patient with                            mild systemic disease. After reviewing the risks                            and benefits, the patient was deemed in  satisfactory condition to undergo the procedure.                           After obtaining informed consent, the colonoscope                            was passed under direct vision. Throughout the                            procedure, the patient's blood pressure, pulse, and                            oxygen saturations were monitored continuously. The                            Colonoscope was introduced through the anus and                          advanced to the the cecum, identified by                            appendiceal orifice and ileocecal valve. The                            ileocecal valve, appendiceal orifice, and rectum                            were photographed. The quality of the bowel                            preparation was excellent. The colonoscopy was                            performed without difficulty. The patient tolerated                            the procedure well. Scope In: 11:13:41 AM Scope Out: 11:26:24 AM Scope Withdrawal Time: 0 hours 9 minutes 36 seconds  Total Procedure Duration: 0 hours 12 minutes 43 seconds  Findings:                 External hemorrhoids and hemorhoidal tags were                            found on perianal exam.                           Multiple medium-mouthed diverticula were found in                            the left colon.                           The exam was otherwise without abnormality on  direct and retroflexion views. Complications:            No immediate complications. Estimated blood loss:                            None. Estimated Blood Loss:     Estimated blood loss: none. Impression:               - External hemorrhoids and hemorrhoidal tags found                            on perianal exam.                           - Diverticulosis in the left colon.                           - The examination was otherwise normal on direct                            and retroflexion views.                           - No specimens collected. Recommendation:           - Patient has a contact number available for                            emergencies. The signs and symptoms of potential                            delayed complications were discussed with the                            patient. Return to normal activities tomorrow.                            Written discharge instructions were provided to the                             patient.                           - High fiber diet.                           - Continue present medications.                           - No repeat colonoscopy due to age and the absence                            of colonic polyps. Ladene Artist, MD 04/01/2019 11:31:38 AM This report has been signed electronically.

## 2019-04-01 NOTE — Progress Notes (Signed)
Courtney Washington , CMA- Temp Judy Branson, CMA- [facility]  

## 2019-04-01 NOTE — Progress Notes (Signed)
Report given to PACU, vss 

## 2019-04-03 ENCOUNTER — Telehealth: Payer: Self-pay

## 2019-04-03 NOTE — Telephone Encounter (Signed)
  Follow up Call-  Call back number 04/01/2019  Post procedure Call Back phone  # [phone]  Permission to leave phone message Yes  Some recent data might be hidden     Patient questions:  Do you have a fever, pain , or abdominal swelling? No. Pain Score  0 *  Have you tolerated food without any problems? Yes.    Have you been able to return to your normal activities? Yes.    Do you have any questions about your discharge instructions: Diet   No. Medications  No. Follow up visit  No.  Do you have questions or concerns about your Care? No.  Actions: * If pain score is 4 or above: No action needed, pain <4.  1. Have you developed a fever since your procedure? no  2.   Have you had an respiratory symptoms (SOB or cough) since your procedure? no  3.   Have you tested positive for COVID 19 since your procedure no  4.   Have you had any family members/close contacts diagnosed with the COVID 19 since your procedure?  no   If any of these questions are a yes, please inquire if patient has been seen by family doctor and route this note to Joylene John, Therapist, sports.

## 2019-04-28 ENCOUNTER — Encounter: Payer: Self-pay | Admitting: Allergy and Immunology

## 2019-04-28 ENCOUNTER — Ambulatory Visit (INDEPENDENT_AMBULATORY_CARE_PROVIDER_SITE_OTHER): Payer: PPO | Admitting: Allergy and Immunology

## 2019-04-28 ENCOUNTER — Other Ambulatory Visit: Payer: Self-pay

## 2019-04-28 VITALS — BP 148/74 | HR 72 | Temp 98.1°F | Resp 20

## 2019-04-28 DIAGNOSIS — L501 Idiopathic urticaria: Secondary | ICD-10-CM

## 2019-04-28 NOTE — Patient Instructions (Signed)
  1.  Use the following medications every day:   A.  Cetirizine 10 mg tablet 1-2 times per day  B.  Famotidine 20 mg tablet 1-2 times per day  2.  Can add OTC Benadryl if needed  3.  Return in 1 year or earlier if problem  4.  Obtain fall flu vaccine

## 2019-04-28 NOTE — Progress Notes (Signed)
[place] [facility] [place] [facility] [place]   Follow-up Note  Referring Provider: [name], MD Primary Provider: [name], MD Date of Office Visit: 04/28/2019  Subjective:   [name] (DOB: [birth year]) is a 78 y.o. male who returns to the [facility] on 04/28/2019 in re-evaluation of the following:  HPI: Buddy returns to this clinic in reevaluation of his chronic urticaria.  His last visit to this clinic was 15 July 2018.  He continues to have a pattern of remission and relapse regarding his chronic urticaria.  Sometimes he uses his H1 and H2 receptor blocker 1 time per day and sometimes he must use it twice a day.  There have been a few opportunities where he has done really well and he has discontinued these agents and within 3 days or so he redevelops urticaria.  He thinks that heat might be 1 of his triggers because this commonly occurs while he is in the shower.  Once again he has not shown any associated systemic or constitutional symptoms and overall his overall health status is about the same as it was during his last visit.  Allergies as of 04/28/2019      Reactions   Wellbutrin [bupropion Hcl] Rash      Medication List      ALPRAZolam 0.5 MG tablet Commonly known as: XANAX Take 1 tablet (0.5 mg total) by mouth at bedtime as needed for anxiety.   cetirizine 10 MG tablet Commonly known as: ZYRTEC Take 10 mg by mouth daily.   famotidine 20 MG tablet Commonly known as: PEPCID Take 1 tablet (20 mg total) by mouth daily.   ketorolac 0.5 % ophthalmic solution Commonly known as: ACULAR INSTILL 1 DROP INTO LEFT EYE 4 TIMES A DAY   LORazepam 0.5 MG tablet Commonly known as: ATIVAN Take 1 tablet (0.5 mg total) at bedtime by mouth.   zolpidem 10 MG tablet Commonly known as: AMBIEN Take 1 tablet (10 mg total) by mouth at bedtime as needed for sleep.       Past Medical History:  Diagnosis Date  . Allergy    seasonal  . BPH (benign prostatic hypertrophy)   . Cataract    bilateral-removed "back in 90's"  . Eczema   . Hyperlipidemia   . Peyronie's disease   . Retinal tear 2000's   bilateral-surgery to repair.  [facility] Kitchen Urticaria     Past Surgical History:  Procedure Laterality Date  . COLONOSCOPY    . EYE SURGERY Bilateral 06/08   retinal detachment OS  . EYE SURGERY Bilateral 1994   cataract OU  . TONSILLECTOMY      Review of systems negative except as noted in HPI / PMHx or noted below:  Review of Systems  Constitutional: Negative.   HENT: Negative.   Eyes: Negative.   Respiratory: Negative.   Cardiovascular: Negative.   Gastrointestinal: Negative.   Genitourinary: Negative.   Musculoskeletal: Negative.   Skin: Negative.   Neurological: Negative.   Endo/Heme/Allergies: Negative.   Psychiatric/Behavioral: Negative.      Objective:   Vitals:   04/28/19 1532  BP: (!) 148/74  Pulse: 72  Resp: 20  Temp: 98.1 F (36.7 C)  SpO2: 98%          Physical Exam Constitutional:      Appearance: He is not diaphoretic.  HENT:     Head: Normocephalic.     Right Ear: Tympanic membrane, ear canal and external  ear normal.     Left Ear: Tympanic membrane, ear canal and external ear normal.     Nose: Nose normal. No mucosal edema or rhinorrhea.     Mouth/Throat:     Pharynx: Uvula midline. No oropharyngeal exudate.  Eyes:     Conjunctiva/sclera: Conjunctivae normal.  Neck:     Thyroid: No thyromegaly.     Trachea: Trachea normal. No tracheal tenderness or tracheal deviation.  Cardiovascular:     Rate and Rhythm: Normal rate and regular rhythm.     Heart sounds: Normal heart sounds, S1 normal and S2 normal. No murmur.  Pulmonary:     Effort: No respiratory distress.     Breath sounds: Normal breath sounds. No stridor. No wheezing or rales.  Lymphadenopathy:     Head:     Right side of head: No tonsillar adenopathy.     Left side of head: No tonsillar adenopathy.      Cervical: No cervical adenopathy.  Skin:    Findings: No erythema or rash.     Nails: There is no clubbing.   Neurological:     Mental Status: He is alert.     Diagnostics: none  Assessment and Plan:   1. Idiopathic urticaria     1.  Use the following medications every day:   A.  Cetirizine 10 mg tablet 1-2 times per day  B.  Famotidine 20 mg tablet 1-2 times per day  2.  Can add OTC Benadryl if needed  3.  Return in 1 year or earlier if problem  4.  Obtain fall flu vaccine  Overall Buddy appears to be doing relatively well and he has a good understanding of his disease process and understands how to use his medications appropriately.  He does appear to have a thermal trigger and I made the recommendation that maybe he spend the last 30 seconds of his shower using lukewarm water to remove this thermal trigger.  I will see him back in this clinic in 1 year or earlier if there is a problem.  Allena Katz, MD Allergy / Immunology [place]

## 2019-04-29 ENCOUNTER — Encounter: Payer: Self-pay | Admitting: Allergy and Immunology

## 2019-05-16 ENCOUNTER — Other Ambulatory Visit: Payer: Self-pay

## 2019-05-16 ENCOUNTER — Ambulatory Visit (INDEPENDENT_AMBULATORY_CARE_PROVIDER_SITE_OTHER): Payer: PPO | Admitting: Internal Medicine

## 2019-05-16 ENCOUNTER — Encounter: Payer: Self-pay | Admitting: Internal Medicine

## 2019-05-16 VITALS — BP 122/60 | HR 94 | Temp 98.3°F | Ht 73.0 in | Wt 168.0 lb

## 2019-05-16 DIAGNOSIS — E782 Mixed hyperlipidemia: Secondary | ICD-10-CM | POA: Diagnosis not present

## 2019-05-16 NOTE — Progress Notes (Signed)
   Subjective:    Patient ID: [name], male    DOB: 1940-11-12, 78 y.o.   MRN: [id number]  HPI  78 year old Male for follow up on hyperlipidemia.  He has a history of mixed hyperlipidemia and 6 months ago total cholesterol was 221, HDL 40, triglycerides 181 and LDL cholesterol 149.  He previously had pure hyperlipidemia about 2 years ago his triglycerides began to increase.  He tried diet and exercise.  He resides alone.  Unfortunately his lipids have not improved substantially.  His triglycerides are down to 160 and previously were 181.  Total cholesterol went up from 221 to 240 and LDL cholesterol has increased from 149 to 164.  Review of Systems see above     Objective:   Physical Exam Wt. 168 pounds. BP 122/60 chest clear to auscultation.  Cardiac exam regular rate and rhythm.  Extremities without edema.  Skin warm and dry.         Assessment & Plan:  Mixed hyperlipidemia  Plan: He will start Crestor 5 mg 3 times a week and follow-up in 3 months.  He will need fasting lipid panel and liver functions at that time.

## 2019-05-19 ENCOUNTER — Other Ambulatory Visit: Payer: PPO | Admitting: Internal Medicine

## 2019-05-19 ENCOUNTER — Other Ambulatory Visit: Payer: Self-pay

## 2019-05-19 DIAGNOSIS — E782 Mixed hyperlipidemia: Secondary | ICD-10-CM | POA: Diagnosis not present

## 2019-05-19 NOTE — Addendum Note (Signed)
Addended by: Mady Haagensen on: 05/19/2019 09:57 AM   Modules accepted: Orders

## 2019-05-20 LAB — LIPID PANEL
Cholesterol: 240 mg/dL — ABNORMAL HIGH (ref <200)
HDL: 45 mg/dL (ref >=40)
LDL Cholesterol (Calc): 164 mg/dL (calc) — ABNORMAL HIGH
Non-HDL Cholesterol (Calc): 195 mg/dL (calc) — ABNORMAL HIGH (ref <130)
Total CHOL/HDL Ratio: 5.3 (calc) — ABNORMAL HIGH (ref <5.0)
Triglycerides: 160 mg/dL — ABNORMAL HIGH (ref <150)

## 2019-05-22 ENCOUNTER — Other Ambulatory Visit: Payer: Self-pay | Admitting: Internal Medicine

## 2019-05-22 MED ORDER — ROSUVASTATIN CALCIUM 5 MG PO TABS: ORAL_TABLET | ORAL | 1 refills | Status: DC

## 2019-05-28 ENCOUNTER — Other Ambulatory Visit: Payer: Self-pay | Admitting: Allergy and Immunology

## 2019-05-28 NOTE — Telephone Encounter (Signed)
Patient called back [place] informed to get OTC patient verbalized understanding

## 2019-05-28 NOTE — Telephone Encounter (Signed)
Yes, OTC formulation would be fine.

## 2019-05-28 NOTE — Telephone Encounter (Signed)
Is it ok for patient to get Famotidine otc?

## 2019-05-28 NOTE — Telephone Encounter (Signed)
Left detailed message advising patient to get OTC famotidine

## 2019-05-31 NOTE — Patient Instructions (Signed)
Take Crestor 5 mg 3 times a week and follow-up with lipid panel and liver functions in 3 months.  Have flu vaccine in the fall.

## 2019-10-04 DIAGNOSIS — Z20828 Contact with and (suspected) exposure to other viral communicable diseases: Secondary | ICD-10-CM | POA: Diagnosis not present

## 2019-10-04 DIAGNOSIS — J3489 Other specified disorders of nose and nasal sinuses: Secondary | ICD-10-CM | POA: Diagnosis not present

## 2019-10-13 ENCOUNTER — Ambulatory Visit (INDEPENDENT_AMBULATORY_CARE_PROVIDER_SITE_OTHER): Payer: PPO | Admitting: Internal Medicine

## 2019-10-13 ENCOUNTER — Other Ambulatory Visit: Payer: Self-pay

## 2019-10-13 ENCOUNTER — Encounter: Payer: Self-pay | Admitting: Internal Medicine

## 2019-10-13 DIAGNOSIS — Z23 Encounter for immunization: Secondary | ICD-10-CM

## 2019-10-13 NOTE — Patient Instructions (Signed)
Patient received a flu vaccine IM L deltoid, AV, CMA  

## 2019-10-13 NOTE — Progress Notes (Signed)
Given flu vaccine by CMA 

## 2019-10-19 ENCOUNTER — Other Ambulatory Visit: Payer: Self-pay | Admitting: Internal Medicine

## 2019-12-15 DIAGNOSIS — C44722 Squamous cell carcinoma of skin of right lower limb, including hip: Secondary | ICD-10-CM | POA: Diagnosis not present

## 2019-12-15 DIAGNOSIS — L578 Other skin changes due to chronic exposure to nonionizing radiation: Secondary | ICD-10-CM | POA: Diagnosis not present

## 2019-12-15 DIAGNOSIS — D0471 Carcinoma in situ of skin of right lower limb, including hip: Secondary | ICD-10-CM | POA: Diagnosis not present

## 2019-12-15 DIAGNOSIS — L300 Nummular dermatitis: Secondary | ICD-10-CM | POA: Diagnosis not present

## 2019-12-15 DIAGNOSIS — L501 Idiopathic urticaria: Secondary | ICD-10-CM | POA: Diagnosis not present

## 2019-12-18 ENCOUNTER — Other Ambulatory Visit: Payer: PPO | Admitting: Internal Medicine

## 2019-12-18 ENCOUNTER — Other Ambulatory Visit: Payer: Self-pay

## 2019-12-18 DIAGNOSIS — E782 Mixed hyperlipidemia: Secondary | ICD-10-CM | POA: Diagnosis not present

## 2019-12-18 DIAGNOSIS — N400 Enlarged prostate without lower urinary tract symptoms: Secondary | ICD-10-CM | POA: Diagnosis not present

## 2019-12-18 DIAGNOSIS — H5213 Myopia, bilateral: Secondary | ICD-10-CM

## 2019-12-18 DIAGNOSIS — R972 Elevated prostate specific antigen [PSA]: Secondary | ICD-10-CM | POA: Diagnosis not present

## 2019-12-18 DIAGNOSIS — H52223 Regular astigmatism, bilateral: Secondary | ICD-10-CM | POA: Diagnosis not present

## 2019-12-18 DIAGNOSIS — H524 Presbyopia: Secondary | ICD-10-CM | POA: Diagnosis not present

## 2019-12-18 DIAGNOSIS — N486 Induration penis plastica: Secondary | ICD-10-CM | POA: Diagnosis not present

## 2019-12-18 DIAGNOSIS — R030 Elevated blood-pressure reading, without diagnosis of hypertension: Secondary | ICD-10-CM | POA: Diagnosis not present

## 2019-12-19 ENCOUNTER — Encounter: Payer: Self-pay | Admitting: Internal Medicine

## 2019-12-19 ENCOUNTER — Ambulatory Visit (INDEPENDENT_AMBULATORY_CARE_PROVIDER_SITE_OTHER): Payer: PPO | Admitting: Internal Medicine

## 2019-12-19 ENCOUNTER — Telehealth: Payer: Self-pay | Admitting: Internal Medicine

## 2019-12-19 VITALS — BP 150/78 | HR 80 | Temp 98.0°F | Ht 73.0 in | Wt 171.0 lb

## 2019-12-19 DIAGNOSIS — R030 Elevated blood-pressure reading, without diagnosis of hypertension: Secondary | ICD-10-CM

## 2019-12-19 DIAGNOSIS — E782 Mixed hyperlipidemia: Secondary | ICD-10-CM

## 2019-12-19 DIAGNOSIS — Z8659 Personal history of other mental and behavioral disorders: Secondary | ICD-10-CM

## 2019-12-19 DIAGNOSIS — Z0000 Encounter for general adult medical examination without abnormal findings: Secondary | ICD-10-CM | POA: Diagnosis not present

## 2019-12-19 DIAGNOSIS — N400 Enlarged prostate without lower urinary tract symptoms: Secondary | ICD-10-CM

## 2019-12-19 DIAGNOSIS — R0989 Other specified symptoms and signs involving the circulatory and respiratory systems: Secondary | ICD-10-CM | POA: Diagnosis not present

## 2019-12-19 DIAGNOSIS — N486 Induration penis plastica: Secondary | ICD-10-CM

## 2019-12-19 LAB — CBC WITH DIFFERENTIAL/PLATELET
Absolute Monocytes: 703 cells/uL (ref 200–950)
Basophils Absolute: 22 cells/uL (ref 0–200)
Basophils Relative: 0.3 %
Eosinophils Absolute: 81 cells/uL (ref 15–500)
Eosinophils Relative: 1.1 %
HCT: 45.3 % (ref 38.5–50.0)
Hemoglobin: 15.1 g/dL (ref 13.2–17.1)
Lymphs Abs: 1776 cells/uL (ref 850–3900)
MCH: 29.6 pg (ref 27.0–33.0)
MCHC: 33.3 g/dL (ref 32.0–36.0)
MCV: 88.8 fL (ref 80.0–100.0)
MPV: 10.6 fL (ref 7.5–12.5)
Monocytes Relative: 9.5 %
Neutro Abs: 4817 cells/uL (ref 1500–7800)
Neutrophils Relative %: 65.1 %
Platelets: 329 10*3/uL (ref 140–400)
RBC: 5.1 10*6/uL (ref 4.20–5.80)
RDW: 12.6 % (ref 11.0–15.0)
Total Lymphocyte: 24 %
WBC: 7.4 10*3/uL (ref 3.8–10.8)

## 2019-12-19 MED ORDER — AZELASTINE HCL 0.1 % NA SOLN: 1.0000 | Freq: Two times a day (BID) | NASAL | 12 refills | Status: DC

## 2019-12-19 NOTE — Progress Notes (Signed)
Subjective:    Patient ID: [name], male    DOB: 1941-03-17, 79 y.o.   MRN: [id number]  HPI 79 year old Male for Medicare wellness, annual exam and evaluation of medical issues.  History of anxiety for which he takes Xanax sparingly.  Sometimes has insomnia.  His general health is good.  He sees Dr. Rosana Hoes for elevated PSA.  History of Peyronie's disease.  History of hyperlipidemia that has been managed him to try Crestor 5 mg 3 times a week.  Cholesterol has decreased from 240 to 199.  Triglycerides have decreased from 1 60-1 44 and LDL has decreased from 164 to 121 Recent PSA is 3.7 which is excellent.  Social history: He is a widower.  Wife died of complications of COPD.  Retired but formerly worked as a Librarian, academic for [employer].  He likes spend time outdoors.  He likes to fish.  Son deceased in motor vehicle accident many years ago before his wife died.  Does not smoke.  No other children.  Past medical history: Tonsillectomy 1949, bilateral cataract extraction 1994.  Surgery for retinal detachment of left eye by Dr. Moshe Salisbury in June 2001.  Colonoscopy done March 2005.  History of positional vertigo in the past with episode of cervical radiculopathy.  History of negative exercise tolerance test 1999.  Had tetanus immunization 2015.  Has had Pneumovax and Prevnar immunizations.  He is disinclined to take COVID-19 vaccine at present time.   Review of Systems  Respiratory: Negative.   Cardiovascular: Negative.   Gastrointestinal: Negative.   Neurological: Negative.   Psychiatric/Behavioral: Negative.        Objective:   Physical Exam Blood pressure 150/78 BMI 22.56 pulse 80 temperature 98 degrees pulse oximetry 97% weight 171 pounds skin warm and dry nodes none.  Neck is supple.  He has a left carotid bruit.  No thyromegaly.  Chest clear to auscultation.  Cardiac exam regular rate and rhythm normal S1 and S2.  Abdomen soft nondistended without hepatosplenomegaly masses or  tenderness.  Rectal exam deferred to urologist.  No lower extremity edema.  Neuro no focal deficits.  Affect thought and judgment are normal.         Assessment & Plan:   Left carotid bruit- To have carotid dopplers  Hyperlipidemia- lipid panel shows excellent response to Crestor with LDL now 121 and previously was 164.  Total cholesterol is now 199 and previously was 240.  Triglycerides decreased from 1 60-1 44.  History of elevated PSA followed by urologist but recent PSA is normal at 3.7  History of Peyronie's disease followed by urologist  History of anxiety and insomnia treated sparingly with Xanax  Plan: He will have carotid Doppler studies and follow-up here in 6 months with lipid panel liver functions blood pressure check and office visit.  His blood pressure is borderline elevated.  Subjective:   Patient presents for Medicare Annual/Subsequent preventive examination.  Review Past Medical/Family/Social: See above  Risk Factors  Current exercise habits: He is physically active about his home and outdoors Dietary issues discussed: Low-fat low carbohydrate  Cardiac risk factors: Hyperlipidemia  Depression Screen  (Note: if answer to either of the following is "Yes", a more complete depression screening is indicated)   Over the past two weeks, have you felt down, depressed or hopeless? No  Over the past two weeks, have you felt little interest or pleasure in doing things? No Have you lost interest or pleasure in daily life? No Do you  often feel hopeless? No Do you cry easily over simple problems? No   Activities of Daily Living  In your present state of health, do you have any difficulty performing the following activities?:   Driving? No  Managing money? No  Feeding yourself? No  Getting from bed to chair? No  Climbing a flight of stairs? No  Preparing food and eating?: No  Bathing or showering? No  Getting dressed: No  Getting to the toilet? No  Using the  toilet:No  Moving around from place to place: No  In the past year have you fallen or had a near fall?:No  Are you sexually active? No  Do you have more than one partner? No   Hearing Difficulties: No  Do you often ask people to speak up or repeat themselves? No  Do you experience ringing or noises in your ears? No  Do you have difficulty understanding soft or whispered voices? No  Do you feel that you have a problem with memory? No Do you often misplace items? No    Home Safety:  Do you have a smoke alarm at your residence? Yes Do you have grab bars in the bathroom?  Yes Do you have throw rugs in your house?  None   Cognitive Testing  Alert? Yes Normal Appearance?Yes  Oriented to person? Yes Place? Yes  Time? Yes  Recall of three objects? Yes  Can perform simple calculations? Yes  Displays appropriate judgment?Yes  Can read the correct time from a watch face?Yes   List the Names of Other Physician/Practitioners you currently use:  See referral list for the physicians patient is currently seeing.  Dr. Lawerance Bach   Review of Systems: See above   Objective:     General appearance: Appears stated age and mildly obese  Head: Normocephalic, without obvious abnormality, atraumatic  Eyes: conj clear, EOMi PEERLA  Ears: normal TM's and external ear canals both ears  Nose: Nares normal. Septum midline. Mucosa normal. No drainage or sinus tenderness.  Throat: lips, mucosa, and tongue normal; teeth and gums normal  Neck: no adenopathy, no carotid bruit, no JVD, supple, symmetrical, trachea midline and thyroid not enlarged, symmetric, no tenderness/mass/nodules  No CVA tenderness.  Lungs: clear to auscultation bilaterally  Breasts: normal appearance, no masses or tenderness Heart: regular rate and rhythm, S1, S2 normal, no murmur, click, rub or gallop  Abdomen: soft, non-tender; bowel sounds normal; no masses, no organomegaly  Musculoskeletal: ROM normal in all joints, no  crepitus, no deformity, Normal muscle strengthen. Back  is symmetric, no curvature. Skin: Skin color, texture, turgor normal. No rashes or lesions  Lymph nodes: Cervical, supraclavicular, and axillary nodes normal.  Neurologic: CN 2 -12 Normal, Normal symmetric reflexes. Normal coordination and gait  Psych: Alert & Oriented x 3, Mood appear stable.    Assessment:    Annual wellness medicare exam   Plan:    During the course of the visit the patient was educated and counseled about appropriate screening and preventive services including:   Advised patient to take COVID-19 vaccine    Patient Instructions (the written plan) was given to the patient.  Medicare Attestation  I have personally reviewed:  The patient's medical and social history  Their use of alcohol, tobacco or illicit drugs  Their current medications and supplements  The patient's functional ability including ADLs,fall risks, home safety risks, cognitive, and hearing and visual impairment  Diet and physical activities  Evidence for depression or mood disorders  The  patient's weight, height, BMI, and visual acuity have been recorded in the chart. I have made referrals, counseling, and provided education to the patient based on review of the above and I have provided the patient with a written personalized care plan for preventive services.

## 2019-12-19 NOTE — Telephone Encounter (Signed)
Faxed office notes and TSH labs to Dr Mare Ferrari in [place] [phone], phone number [phone]

## 2019-12-20 LAB — LIPID PANEL
Cholesterol: 199 mg/dL (ref <200)
HDL: 52 mg/dL (ref >=40)
LDL Cholesterol (Calc): 121 mg/dL (calc) — ABNORMAL HIGH
Non-HDL Cholesterol (Calc): 147 mg/dL (calc) — ABNORMAL HIGH (ref <130)
Total CHOL/HDL Ratio: 3.8 (calc) (ref <5.0)
Triglycerides: 144 mg/dL (ref <150)

## 2019-12-20 LAB — COMPLETE METABOLIC PANEL WITH GFR
AG Ratio: 1.5 (calc) (ref 1.0–2.5)
ALT: 16 U/L (ref 9–46)
AST: 17 U/L (ref 10–35)
Albumin: 4.3 g/dL (ref 3.6–5.1)
Alkaline phosphatase (APISO): 87 U/L (ref 35–144)
BUN: 15 mg/dL (ref 7–25)
CO2: 21 mmol/L (ref 20–32)
Calcium: 9.7 mg/dL (ref 8.6–10.3)
Chloride: 107 mmol/L (ref 98–110)
Creat: 0.79 mg/dL (ref 0.70–1.18)
GFR, Est African American: 100 mL/min/{1.73_m2} (ref >=60)
GFR, Est Non African American: 87 mL/min/{1.73_m2} (ref >=60)
Globulin: 2.8 g/dL (calc) (ref 1.9–3.7)
Glucose, Bld: 92 mg/dL (ref 65–99)
Potassium: 4.1 mmol/L (ref 3.5–5.3)
Sodium: 143 mmol/L (ref 135–146)
Total Bilirubin: 0.4 mg/dL (ref 0.2–1.2)
Total Protein: 7.1 g/dL (ref 6.1–8.1)

## 2019-12-20 LAB — TEST AUTHORIZATION

## 2019-12-20 LAB — TSH: TSH: 2.29 mIU/L (ref 0.40–4.50)

## 2019-12-20 LAB — PSA: PSA: 3.7 ng/mL

## 2019-12-24 DIAGNOSIS — H35072 Retinal telangiectasis, left eye: Secondary | ICD-10-CM | POA: Diagnosis not present

## 2019-12-24 DIAGNOSIS — H35352 Cystoid macular degeneration, left eye: Secondary | ICD-10-CM | POA: Diagnosis not present

## 2019-12-24 DIAGNOSIS — H35071 Retinal telangiectasis, right eye: Secondary | ICD-10-CM | POA: Diagnosis not present

## 2019-12-24 DIAGNOSIS — H21222 Degeneration of ciliary body, left eye: Secondary | ICD-10-CM | POA: Diagnosis not present

## 2019-12-26 DIAGNOSIS — C44722 Squamous cell carcinoma of skin of right lower limb, including hip: Secondary | ICD-10-CM | POA: Diagnosis not present

## 2019-12-29 ENCOUNTER — Other Ambulatory Visit: Payer: Self-pay

## 2019-12-29 DIAGNOSIS — R0989 Other specified symptoms and signs involving the circulatory and respiratory systems: Secondary | ICD-10-CM

## 2019-12-29 NOTE — Telephone Encounter (Signed)
Mailed all lab results to patient.

## 2020-01-02 ENCOUNTER — Other Ambulatory Visit (HOSPITAL_COMMUNITY): Payer: PPO

## 2020-01-04 NOTE — Patient Instructions (Signed)
It was a pleasure to see you today.  Continue statin therapy.  You have had excellent response to this.  Have carotid Doppler for left carotid bruit.  Continue to monitor blood pressure at home.  Take Xanax as needed for anxiety and insomnia.  Follow-up in 6 months.

## 2020-01-12 DIAGNOSIS — N401 Enlarged prostate with lower urinary tract symptoms: Secondary | ICD-10-CM | POA: Diagnosis not present

## 2020-01-12 DIAGNOSIS — N138 Other obstructive and reflux uropathy: Secondary | ICD-10-CM | POA: Diagnosis not present

## 2020-01-12 DIAGNOSIS — R972 Elevated prostate specific antigen [PSA]: Secondary | ICD-10-CM | POA: Diagnosis not present

## 2020-01-12 DIAGNOSIS — N529 Male erectile dysfunction, unspecified: Secondary | ICD-10-CM | POA: Diagnosis not present

## 2020-01-14 ENCOUNTER — Ambulatory Visit (HOSPITAL_COMMUNITY)
Admission: RE | Admit: 2020-01-14 | Discharge: 2020-01-14 | Disposition: A | Discharge status: Home / Self Care | Payer: PPO | Source: Ambulatory Visit | Attending: Cardiovascular Disease | Admitting: Cardiovascular Disease

## 2020-01-14 ENCOUNTER — Other Ambulatory Visit: Payer: Self-pay

## 2020-01-14 DIAGNOSIS — R0989 Other specified symptoms and signs involving the circulatory and respiratory systems: Secondary | ICD-10-CM | POA: Insufficient documentation

## 2020-01-17 DIAGNOSIS — I361 Nonrheumatic tricuspid (valve) insufficiency: Secondary | ICD-10-CM | POA: Diagnosis not present

## 2020-01-17 DIAGNOSIS — R29818 Other symptoms and signs involving the nervous system: Secondary | ICD-10-CM | POA: Diagnosis not present

## 2020-01-17 DIAGNOSIS — G459 Transient cerebral ischemic attack, unspecified: Secondary | ICD-10-CM | POA: Diagnosis not present

## 2020-01-17 DIAGNOSIS — I6389 Other cerebral infarction: Secondary | ICD-10-CM | POA: Diagnosis not present

## 2020-01-17 DIAGNOSIS — I340 Nonrheumatic mitral (valve) insufficiency: Secondary | ICD-10-CM | POA: Diagnosis not present

## 2020-01-17 DIAGNOSIS — R200 Anesthesia of skin: Secondary | ICD-10-CM | POA: Diagnosis not present

## 2020-01-18 DIAGNOSIS — R29701 NIHSS score 1: Secondary | ICD-10-CM | POA: Diagnosis not present

## 2020-01-18 DIAGNOSIS — I6601 Occlusion and stenosis of right middle cerebral artery: Secondary | ICD-10-CM | POA: Diagnosis not present

## 2020-01-18 DIAGNOSIS — Z79899 Other long term (current) drug therapy: Secondary | ICD-10-CM | POA: Diagnosis not present

## 2020-01-18 DIAGNOSIS — R29818 Other symptoms and signs involving the nervous system: Secondary | ICD-10-CM | POA: Diagnosis not present

## 2020-01-18 DIAGNOSIS — I639 Cerebral infarction, unspecified: Secondary | ICD-10-CM | POA: Diagnosis not present

## 2020-01-18 DIAGNOSIS — G459 Transient cerebral ischemic attack, unspecified: Secondary | ICD-10-CM | POA: Diagnosis not present

## 2020-01-18 DIAGNOSIS — I6621 Occlusion and stenosis of right posterior cerebral artery: Secondary | ICD-10-CM | POA: Diagnosis not present

## 2020-01-18 DIAGNOSIS — R200 Anesthesia of skin: Secondary | ICD-10-CM | POA: Diagnosis not present

## 2020-01-18 DIAGNOSIS — E785 Hyperlipidemia, unspecified: Secondary | ICD-10-CM | POA: Diagnosis not present

## 2020-01-19 ENCOUNTER — Telehealth: Payer: Self-pay

## 2020-01-19 ENCOUNTER — Telehealth: Payer: Self-pay | Admitting: Internal Medicine

## 2020-01-19 NOTE — Telephone Encounter (Signed)
Patient called back to say he is still in the hospital, that on Saturday he was working outside and felt dizzy, so he sat down then when he tried to get up his left leg was not working right so he had neighbor take him to ED. He was admitted they are running test, said his heart and lungs look good, no bleeding on the brain, he had just came back from MRI. He will call us back to let us know how he is doing and he will also let them know to be sure and send everything to Dr Renold Genta.

## 2020-01-19 NOTE — Telephone Encounter (Signed)
Received notification from patients insurance company that he was in hospital.

## 2020-01-19 NOTE — Telephone Encounter (Signed)
Left message for patient he was admitted to the hospital on 01/17/20, left voicemail to call us back and give [place] an update.

## 2020-01-21 ENCOUNTER — Telehealth: Payer: Self-pay

## 2020-01-21 NOTE — Telephone Encounter (Signed)
Transition Care Management Follow-up Telephone Call  Date of discharge and from where: 01/19/2020 [facility]   How have you been since you were released from the hospital? Good  Any questions or concerns? No   Items Reviewed:  Did the pt receive and understand the discharge instructions provided? Yes   Medications obtained and verified? Yes   Any new allergies since your discharge? No   Dietary orders reviewed? Yes  Do you have support at home? No neighbors   Functional Questionnaire: (I = Independent and D = Dependent) ADLs: I   Bathing/Dressing- I  Meal Prep- I  Eating- I  Maintaining continence- I  Transferring/Ambulation- I  Managing Meds- I  Follow up appointments reviewed:   PCP Hospital f/u appt confirmed? Yes  Scheduled to see on 01/22/20 @ 12:00PM.   [facility] f/u appt confirmed? Yes    Are transportation arrangements needed? Yes   If their condition worsens, is the pt aware to call PCP or go to the Emergency Dept.? Yes  Was the patient provided with contact information for the PCP's office or ED? Yes  Was to pt encouraged to call back with questions or concerns? Yes

## 2020-01-29 ENCOUNTER — Other Ambulatory Visit: Payer: Self-pay

## 2020-01-29 ENCOUNTER — Ambulatory Visit (INDEPENDENT_AMBULATORY_CARE_PROVIDER_SITE_OTHER): Payer: PPO | Admitting: Internal Medicine

## 2020-01-29 ENCOUNTER — Encounter: Payer: Self-pay | Admitting: Internal Medicine

## 2020-01-29 VITALS — BP 140/70 | HR 96 | Temp 98.3°F | Ht 73.0 in | Wt 154.0 lb

## 2020-01-29 DIAGNOSIS — H6501 Acute serous otitis media, right ear: Secondary | ICD-10-CM

## 2020-01-29 MED ORDER — AMOXICILLIN 500 MG PO CAPS: 500.0000 mg | ORAL_CAPSULE | Freq: Three times a day (TID) | ORAL | 0 refills | Status: DC

## 2020-01-29 NOTE — Progress Notes (Signed)
   Subjective:    Patient ID: [name], male    DOB: [birth year], 79 y.o.   MRN: [id number]  HPI Patient was working in yard March 14 and was quite physically active carrying a large container of weed killer around yard. Came into house and had short episode of vertigo for about 5 minutes without vomiting. Then noticed some weakness LLE so went to [facility] for evaluation.  However, with leg weakness symptoms resolved within about 5 minutes.Seen by teleneurology and dx with possible TIA.  He is on Crestor 5 mg 3 times a week.  Lipid panel in February 2021 had improved considerably from several months previously.  Total cholesterol improved from 240 to 199  Triglycerides improved from 1 60 to144 and LDL improved from 164 to 121.  Patient was admitted and diagnosed with left-sided TIA.  He was thought not to be a TPA candidate.  Echo was unremarkable.  CT of the head was negative.  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  No large vessel occlusion.  Patient was placed on aspirin and Plavix for 3 months and was advised to follow-up with neurology here.  Was advised to stay on lipid-lowering medication with a goal of LDL less than 70.  He was reluctant to start home lipid-lowering medication in the first place several months ago.  He was discharged March 15.  He was seen today for hospital follow-up.  He will be referred to [facility] Neurology.  I have not changed his lipid-lowering dose at this point.  He still on Crestor 5 mg 3 times a week.  He is a widower and resides alone.  No living children.  Son was killed in a motor vehicle accident a number of years ago.  Wife died of COPD complications.    Review of Systems see above-feels well with no new complaints but this episode clearly got his attention.  Unknown in for many years.     Objective:   Physical Exam Blood pressure today 140/70 temperature 98.3 pulse 96 pulse  oximetry 98% weight 154 pounds BMI 20.32  Skin warm and dry.  Nodes none.  Chest clear.  Cardiac exam regular rate and rhythm normal S1 and S2.  Cranial nerves II through XII grossly intact.  Muscle strength in the upper and lower extremities is normal.  Moves all 4 extremities without difficulty.  No facial asymmetry or weakness.  No focal deficits appreciated       Assessment & Plan:  History of hyperlipidemia currently on Crestor 5 mg 3 times a week.  Neurology wants LDL 70 or less.  However I am going to refer him to [facility] neurology for follow-up of his hospitalization and TIA diagnosis.  He needs to keep blood pressure 120/80 or less.  Needs to follow this at home and let me know if it is persistently elevated.  He is always been in excellent health.  Continue aspirin and Plavix.

## 2020-01-31 NOTE — Patient Instructions (Addendum)
Referral to neurology here for evaluation of abnormal MRI and further recommendations.  Continue current medications for now.  Follow-up here in 3 months.

## 2020-02-10 ENCOUNTER — Ambulatory Visit: Payer: PPO | Admitting: Neurology

## 2020-02-10 ENCOUNTER — Encounter: Payer: Self-pay | Admitting: Neurology

## 2020-02-10 ENCOUNTER — Other Ambulatory Visit: Payer: Self-pay

## 2020-02-10 VITALS — BP 161/80 | HR 93 | Temp 97.1°F | Ht 74.0 in | Wt 168.6 lb

## 2020-02-10 DIAGNOSIS — G459 Transient cerebral ischemic attack, unspecified: Secondary | ICD-10-CM | POA: Diagnosis not present

## 2020-02-10 DIAGNOSIS — I6601 Occlusion and stenosis of right middle cerebral artery: Secondary | ICD-10-CM | POA: Diagnosis not present

## 2020-02-10 DIAGNOSIS — E7801 Familial hypercholesterolemia: Secondary | ICD-10-CM | POA: Diagnosis not present

## 2020-02-10 MED ORDER — ROSUVASTATIN CALCIUM 5 MG PO TABS: 5.0000 mg | ORAL_TABLET | Freq: Every day | ORAL | 11 refills | Status: DC

## 2020-02-10 NOTE — Patient Instructions (Signed)
I had a long d/w patient about his recent TIA, middle cerebral artery stenosis,e, risk for recurrent stroke/TIAs, personally independently reviewed imaging studies and stroke evaluation results and answered questions.Continue aspirin and Plavix for 3 months till June 15 and then discontinue Plavix and stay on aspirin alone for secondary stroke prevention and maintain strict control of hypertension with blood pressure goal below 130/90, diabetes with hemoglobin A1c goal below 6.5% and lipids with LDL cholesterol goal below 70 mg/dL. I also advised the patient to eat a healthy diet with plenty of whole grains, cereals, fruits and vegetables, exercise regularly and maintain ideal body weight .I recommend he increase the dose of Crestor to 5 mg daily due to his elevated LDL and have repeat cholesterol checked by his primary care physician in about 2 months.  Followup in the future with my nurse practitioner Janett Billow and 3 months or call earlier if necessary.  Stroke Prevention Some medical conditions and behaviors are associated with a higher chance of having a stroke. You can help prevent a stroke by making nutrition, lifestyle, and other changes, including managing any medical conditions you may have. What nutrition changes can be made?   Eat healthy foods. You can do this by: ? Choosing foods high in fiber, such as fresh fruits and vegetables and whole grains. ? Eating at least 5 or more servings of fruits and vegetables a day. Try to fill half of your plate at each meal with fruits and vegetables. ? Choosing lean protein foods, such as lean cuts of meat, poultry without skin, fish, tofu, beans, and nuts. ? Eating low-fat dairy products. ? Avoiding foods that are high in salt (sodium). This can help lower blood pressure. ? Avoiding foods that have saturated fat, trans fat, and cholesterol. This can help prevent high cholesterol. ? Avoiding processed and premade foods.  Follow your health care  provider's specific guidelines for losing weight, controlling high blood pressure (hypertension), lowering high cholesterol, and managing diabetes. These may include: ? Reducing your daily calorie intake. ? Limiting your daily sodium intake to 1,500 milligrams (mg). ? Using only healthy fats for cooking, such as olive oil, canola oil, or sunflower oil. ? Counting your daily carbohydrate intake. What lifestyle changes can be made?  Maintain a healthy weight. Talk to your health care provider about your ideal weight.  Get at least 30 minutes of moderate physical activity at least 5 days a week. Moderate activity includes brisk walking, biking, and swimming.  Do not use any products that contain nicotine or tobacco, such as cigarettes and e-cigarettes. If you need help quitting, ask your health care provider. It may also be helpful to avoid exposure to secondhand smoke.  Limit alcohol intake to no more than 1 drink a day for nonpregnant women and 2 drinks a day for men. One drink equals 12 oz of beer, 5 oz of wine, or 1 oz of hard liquor.  Stop any illegal drug use.  Avoid taking birth control pills. Talk to your health care provider about the risks of taking birth control pills if: ? You are over 19 years old. ? You smoke. ? You get migraines. ? You have ever had a blood clot. What other changes can be made?  Manage your cholesterol levels. ? Eating a healthy diet is important for preventing high cholesterol. If cholesterol cannot be managed through diet alone, you may also need to take medicines. ? Take any prescribed medicines to control your cholesterol as told by your health  care provider.  Manage your diabetes. ? Eating a healthy diet and exercising regularly are important parts of managing your blood sugar. If your blood sugar cannot be managed through diet and exercise, you may need to take medicines. ? Take any prescribed medicines to control your diabetes as told by your health  care provider.  Control your hypertension. ? To reduce your risk of stroke, try to keep your blood pressure below 130/80. ? Eating a healthy diet and exercising regularly are an important part of controlling your blood pressure. If your blood pressure cannot be managed through diet and exercise, you may need to take medicines. ? Take any prescribed medicines to control hypertension as told by your health care provider. ? Ask your health care provider if you should monitor your blood pressure at home. ? Have your blood pressure checked every year, even if your blood pressure is normal. Blood pressure increases with age and some medical conditions.  Get evaluated for sleep disorders (sleep apnea). Talk to your health care provider about getting a sleep evaluation if you snore a lot or have excessive sleepiness.  Take over-the-counter and prescription medicines only as told by your health care provider. Aspirin or blood thinners (antiplatelets or anticoagulants) may be recommended to reduce your risk of forming blood clots that can lead to stroke.  Make sure that any other medical conditions you have, such as atrial fibrillation or atherosclerosis, are managed. What are the warning signs of a stroke? The warning signs of a stroke can be easily remembered as BEFAST.  B is for balance. Signs include: ? Dizziness. ? Loss of balance or coordination. ? Sudden trouble walking.  E is for eyes. Signs include: ? A sudden change in vision. ? Trouble seeing.  F is for face. Signs include: ? Sudden weakness or numbness of the face. ? The face or eyelid drooping to one side.  A is for arms. Signs include: ? Sudden weakness or numbness of the arm, usually on one side of the body.  S is for speech. Signs include: ? Trouble speaking (aphasia). ? Trouble understanding.  T is for time. ? These symptoms may represent a serious problem that is an emergency. Do not wait to see if the symptoms will go  away. Get medical help right away. Call your local emergency services (911 in the [place] Do not drive yourself to the hospital.  Other signs of stroke may include: ? A sudden, severe headache with no known cause. ? Nausea or vomiting. ? Seizure. Where to find more information For more information, visit:  American Stroke Association: www.strokeassociation.org  National Stroke Association: www.stroke.org Summary  You can prevent a stroke by eating healthy, exercising, not smoking, limiting alcohol intake, and managing any medical conditions you may have.  Do not use any products that contain nicotine or tobacco, such as cigarettes and e-cigarettes. If you need help quitting, ask your health care provider. It may also be helpful to avoid exposure to secondhand smoke.  Remember BEFAST for warning signs of stroke. Get help right away if you or a loved one has any of these signs. This information is not intended to replace advice given to you by your health care provider. Make sure you discuss any questions you have with your health care provider. Document Revised: 10/05/2017 Document Reviewed: 11/28/2016 Elsevier Patient Education  2020 [employer].

## 2020-02-10 NOTE — Progress Notes (Signed)
[facility] [address] [place]. [place] [address] [phone]       OFFICE CONSULT NOTE  [name] Date of Birth:  1941-06-24 Medical Record Number:  [id number]   Referring MD: Micki Riley  Reason for Referral: TIA  HPI: [name] is a 79 year old pleasant Caucasian male seen today for initial office consultation visit for TIA. History is obtained from the patient and review of referral notes as well as electronic medical records and I personally reviewed imaging films in PACS. He has a past medical history of hyperlipidemia alone. He was admitted to [facility] on 01/19/2020 with sudden onset of left sided numbness and weakness. He states symptoms began when he bent down and try to stand up. He felt off balance and had some trouble walking. He denied any headache, slurred speech, change in vision, facial weakness. Did report some pressure in his head but symptoms resolved quickly within 5 to 10 minutes by the time he reached the hospital. CT scan of the head on admission was unremarkable and MRI scan also did not show an acute infarct but MRI showed severe right middle cerebral artery stenosis and M2 as well as left posterior cerebral artery stenosis. 2D echo was unremarkable. LDL cholesterol was elevated 107 mg percent. Hemoglobin A1c was 5.2 in January 2021. He was started on aspirin and Plavix for 3 months which is tolerating well without bruising or bleeding. He was also on Crestor 5 mg every other day 3 days a week which was continued and is tolerating it well. He does have some whitecoat hypertension and blood pressure is elevated today at 161/87. He is not on any antihypertensives. He states his blood pressure at home is usually in the [id number] systolic range. He has had no further history of TIA strokes. There is no family history of strokes. Does not smoke. He does eat healthy and states he is quite active and exercise regularly. He has no new complaints  today.  ROS:   14 system review of systems is positive for numbness, dizziness, imbalance and all other systems negative  PMH:  Past Medical History:  Diagnosis Date  . Allergy    seasonal  . BPH (benign prostatic hypertrophy)   . Cataract    bilateral-removed "back in 90's"  . Eczema   . Hyperlipidemia   . Peyronie's disease   . Retinal tear 2000's   bilateral-surgery to repair.  Marland Kitchen Urticaria     Social History:  Social History   Socioeconomic History  . Marital status: Married    Spouse name: Not on file  . Number of children: 1  . Years of education: Not on file  . Highest education level: Not on file  Occupational History  . Occupation: retired    Fish farm manager: RETIRED  Tobacco Use  . Smoking status: Former Smoker    Years: 20.00    Types: Cigarettes    Quit date: 11/06/1970    Years since quitting: 49.2  . Smokeless tobacco: Never Used  Substance and Sexual Activity  . Alcohol use: Not Currently  . Drug use: No  . Sexual activity: Yes  Other Topics Concern  . Not on file  Social History Narrative  . Not on file   Social Determinants of Health   Financial Resource Strain:   . Difficulty of Paying Living Expenses:   Food Insecurity:   . Worried About Charity fundraiser in the Last Year:   . [employer] of  Food in the Last Year:   Transportation Needs:   . Film/video editor (Medical):   Marland Kitchen Lack of Transportation (Non-Medical):   Physical Activity:   . Days of Exercise per Week:   . Minutes of Exercise per Session:   Stress:   . Feeling of Stress :   Social Connections:   . Frequency of Communication with Friends and Family:   . Frequency of Social Gatherings with Friends and Family:   . Attends Religious Services:   . Active Member of Clubs or Organizations:   . Attends Archivist Meetings:   Marland Kitchen Marital Status:   Intimate Partner Violence:   . Fear of Current or Ex-Partner:   . Emotionally Abused:   Marland Kitchen Physically Abused:   . Sexually  Abused:     Medications:   Current Outpatient Medications on File Prior to Visit  Medication Sig Dispense Refill  . ALPRAZolam (XANAX) 0.5 MG tablet Take 1 tablet (0.5 mg total) by mouth at bedtime as needed for anxiety. 30 tablet 1  . aspirin 81 MG chewable tablet Chew 81 mg by mouth daily.    Marland Kitchen azelastine (ASTELIN) 0.1 % nasal spray Place 1 spray into both nostrils 2 (two) times daily. Use in each nostril as directed 30 mL 12  . cetirizine (ZYRTEC) 10 MG tablet Take 10 mg by mouth daily.    . clopidogrel (PLAVIX) 75 MG tablet Take 75 mg by mouth daily.    . famotidine (PEPCID) 20 MG tablet Take 1 tablet (20 mg total) by mouth daily. 30 tablet 4  . zolpidem (AMBIEN) 10 MG tablet Take 1 tablet (10 mg total) by mouth at bedtime as needed for sleep. 15 tablet 0   No current facility-administered medications on file prior to visit.    Allergies:   Allergies  Allergen Reactions  . Wellbutrin [Bupropion Hcl] Rash    Physical Exam General: Frail elderly Caucasian male seated, in no evident distress Head: head normocephalic and atraumatic.   Neck: supple with no carotid or supraclavicular bruits Cardiovascular: regular rate and rhythm, no murmurs Musculoskeletal: no deformity Skin:  no rash/petichiae Vascular:  Normal pulses all extremities  Neurologic Exam Mental Status: Awake and fully alert. Oriented to place and time. Recent and remote memory intact. Attention span, concentration and fund of knowledge appropriate. Mood and affect appropriate.  Cranial Nerves: Fundoscopic exam reveals sharp disc margins. Pupils equal, briskly reactive to light. Extraocular movements full without nystagmus. Visual fields full to confrontation. Hearing intact. Facial sensation intact. Face, tongue, palate moves normally and symmetrically.  Motor: Normal bulk and tone. Normal strength in all tested extremity muscles. Sensory.: intact to touch , pinprick , position and vibratory sensation.  Coordination:  Rapid alternating movements normal in all extremities. Finger-to-nose and heel-to-shin performed accurately bilaterally. Gait and Station: Arises from chair without difficulty. Stance is normal. Gait demonstrates normal stride length and balance . Able to heel, toe and tandem walk with slight  difficulty.  Reflexes: 1+ and symmetric. Toes downgoing.   NIHSS  0 Modified Rankin  0   ASSESSMENT: 79 year old Caucasian male with right hemispheric TIA in March 2021 with vascular risk factors of intracranial stenosis, hyperlipidemia, age and borderline hypertension     PLAN: I had a long d/w patient about his recent TIA, middle cerebral artery stenosis,e, risk for recurrent stroke/TIAs, personally independently reviewed imaging studies and stroke evaluation results and answered questions.Continue aspirin and Plavix for 3 months till June 15 and then discontinue Plavix and stay  on aspirin alone for secondary stroke prevention and maintain strict control of hypertension with blood pressure goal below 130/90, diabetes with hemoglobin A1c goal below 6.5% and lipids with LDL cholesterol goal below 70 mg/dL. I also advised the patient to eat a healthy diet with plenty of whole grains, cereals, fruits and vegetables, exercise regularly and maintain ideal body weight .I recommend he increase the dose of Crestor to 5 mg daily due to his elevated LDL and have repeat cholesterol checked by his primary care physician in about 2 months.  Greater than 50% time during this 50-minute consultation visit was spent on counseling and coordination of care about his TIA and middle cerebral artery stenosis and discussion about stroke prevention and treatment and answering questions followup in the future with my nurse practitioner Janett Billow and 3 months or call earlier if necessary. Antony Contras, MD  [facility] [address] [place], [place] [address]  Phone [phone] Fax  [phone] Note: This document was prepared with digital dictation and possible smart phrase technology. Any transcriptional errors that result from this process are unintentional.

## 2020-02-16 ENCOUNTER — Other Ambulatory Visit: Payer: Self-pay | Admitting: Internal Medicine

## 2020-02-16 MED ORDER — CLOPIDOGREL BISULFATE 75 MG PO TABS: 75.0000 mg | ORAL_TABLET | Freq: Every day | ORAL | 5 refills | Status: DC

## 2020-02-16 NOTE — Telephone Encounter (Signed)
Received Fax RX request from  Pharmacy -  [id number] - [place], [place] [address] Phone:  [phone]  Fax:  [phone]       Medication - clopidogrel (PLAVIX) 75 MG tablet   Last Refill - 01/19/20  Last OV - 01/29/20  Last CPE - 12/19/19  Next Appointment - 05/14/20

## 2020-02-16 NOTE — Telephone Encounter (Signed)
Refill x 6 months 

## 2020-03-25 DIAGNOSIS — L820 Inflamed seborrheic keratosis: Secondary | ICD-10-CM | POA: Diagnosis not present

## 2020-03-25 DIAGNOSIS — L821 Other seborrheic keratosis: Secondary | ICD-10-CM | POA: Diagnosis not present

## 2020-03-25 DIAGNOSIS — L578 Other skin changes due to chronic exposure to nonionizing radiation: Secondary | ICD-10-CM | POA: Diagnosis not present

## 2020-03-25 DIAGNOSIS — D0471 Carcinoma in situ of skin of right lower limb, including hip: Secondary | ICD-10-CM | POA: Diagnosis not present

## 2020-05-06 ENCOUNTER — Other Ambulatory Visit: Payer: Self-pay

## 2020-05-06 ENCOUNTER — Other Ambulatory Visit: Payer: PPO | Admitting: Internal Medicine

## 2020-05-06 DIAGNOSIS — E782 Mixed hyperlipidemia: Secondary | ICD-10-CM | POA: Diagnosis not present

## 2020-05-06 LAB — HEPATIC FUNCTION PANEL
AG Ratio: 1.8 (calc) (ref 1.0–2.5)
ALT: 14 U/L (ref 9–46)
AST: 19 U/L (ref 10–35)
Albumin: 4.6 g/dL (ref 3.6–5.1)
Alkaline phosphatase (APISO): 82 U/L (ref 35–144)
Bilirubin, Direct: 0.1 mg/dL (ref 0.0–0.2)
Globulin: 2.6 g/dL (calc) (ref 1.9–3.7)
Indirect Bilirubin: 0.7 mg/dL (calc) (ref 0.2–1.2)
Total Bilirubin: 0.8 mg/dL (ref 0.2–1.2)
Total Protein: 7.2 g/dL (ref 6.1–8.1)

## 2020-05-06 LAB — LIPID PANEL
Cholesterol: 185 mg/dL (ref <200)
HDL: 55 mg/dL (ref >=40)
LDL Cholesterol (Calc): 112 mg/dL (calc) — ABNORMAL HIGH
Non-HDL Cholesterol (Calc): 130 mg/dL (calc) — ABNORMAL HIGH (ref <130)
Total CHOL/HDL Ratio: 3.4 (calc) (ref <5.0)
Triglycerides: 83 mg/dL (ref <150)

## 2020-05-14 ENCOUNTER — Ambulatory Visit (INDEPENDENT_AMBULATORY_CARE_PROVIDER_SITE_OTHER): Payer: PPO | Admitting: Internal Medicine

## 2020-05-14 ENCOUNTER — Other Ambulatory Visit: Payer: Self-pay

## 2020-05-14 ENCOUNTER — Encounter: Payer: Self-pay | Admitting: Internal Medicine

## 2020-05-14 VITALS — BP 120/70 | HR 77 | Ht 74.0 in | Wt 167.0 lb

## 2020-05-14 DIAGNOSIS — Z79899 Other long term (current) drug therapy: Secondary | ICD-10-CM | POA: Diagnosis not present

## 2020-05-14 DIAGNOSIS — R0989 Other specified symptoms and signs involving the circulatory and respiratory systems: Secondary | ICD-10-CM

## 2020-05-14 DIAGNOSIS — Z8673 Personal history of transient ischemic attack (TIA), and cerebral infarction without residual deficits: Secondary | ICD-10-CM | POA: Diagnosis not present

## 2020-05-14 DIAGNOSIS — E7800 Pure hypercholesterolemia, unspecified: Secondary | ICD-10-CM

## 2020-05-14 DIAGNOSIS — Z8659 Personal history of other mental and behavioral disorders: Secondary | ICD-10-CM | POA: Diagnosis not present

## 2020-05-14 NOTE — Progress Notes (Signed)
   Subjective:    Patient ID: [name], male    DOB: 1941/04/25, 79 y.o.   MRN: [id number]  HPI 79 year old Male with history of TIA March 2021 involving weakness of left lower extremity and vertigo.  Was admitted to [facility] March 14 and diagnosed with left-sided TIA.  Was not thought to be a TPA candidate.  CT of the head was negative.  MRI showed severe stenosis of the right M1 segment and moderate stenosis of the right P2 segment.  He was placed on aspirin and Plavix for 3 months.  Patient was on Crestor 5 mg 3 times a week and was on Crestor at the time of the TIA.  We referred him to [facility] and he was seen by Dr. [name] April 2021.  Dr. [name] indicated he should stop Plavix mid June and stay on aspirin and try to keep LDL below 70.  He recommended increasing Crestor to daily instead of 3 times a week.  He is now here for follow-up visit.  His lipid panel shows total cholesterol of 185, HDL 55, triglycerides 83 and LDL 112.  Liver functions are normal.  LDL has increased from 121 in February to 112.  He does not really want to change his dose of Crestor at this point in time.  He will remain on aspirin.  He did go to [place] [employer] with a friend recently on a fishing trip and enjoyed himself.  He resides alone.  He is a widower.  His general health is excellent.  Tetanus immunization is up-to-date.  He has had 2 pneumococcal vaccines.  Gets annual flu vaccine.  He had normal carotid Doppler studies in March 2021.  He had normal 2D echocardiogram March 13 at [facility] no new complaints     Objective:   Physical Exam Blood pressure is excellent 120/70, pulse 77, pulse oximetry 97% weight 167 pounds, BMI 21.44 Skin warm and dry.  No cervical adenopathy.  He has a left carotid bruit.  Chest clear to auscultation.  Cardiac exam regular rate and rhythm normal S1 and S2 without murmurs or gallops.  Muscle strength in the upper and lower  extremities is normal.  Sensation is intact.  Cranial nerves II through XII grossly intact.  He is alert and oriented x3.      Assessment & Plan:  History of TIA March 2021-continue aspirin  Mild elevation of LDL despite being on Crestor 5 mg weekly  History of left carotid bruit.  History of allergic rhinitis  History of mild anxiety  Plan: Patient will return in February for Medicare wellness and health maintenance exam.  He will continue with Crestor 5 mg daily and aspirin daily.

## 2020-05-26 ENCOUNTER — Encounter: Payer: Self-pay | Admitting: Adult Health

## 2020-05-26 ENCOUNTER — Ambulatory Visit: Payer: PPO | Admitting: Adult Health

## 2020-05-26 ENCOUNTER — Other Ambulatory Visit: Payer: Self-pay

## 2020-05-26 VITALS — BP 138/64 | HR 81 | Ht 74.0 in | Wt 166.0 lb

## 2020-05-26 DIAGNOSIS — G459 Transient cerebral ischemic attack, unspecified: Secondary | ICD-10-CM

## 2020-05-26 DIAGNOSIS — E7801 Familial hypercholesterolemia: Secondary | ICD-10-CM | POA: Diagnosis not present

## 2020-05-26 DIAGNOSIS — I6601 Occlusion and stenosis of right middle cerebral artery: Secondary | ICD-10-CM | POA: Diagnosis not present

## 2020-05-26 NOTE — Patient Instructions (Signed)
Continue aspirin 81 mg daily  and Crestor 5 mg daily for secondary stroke prevention  Continue to follow up with PCP regarding cholesterol and blood pressure management  Ensure repeat lipid panel with PCP and if LDL or bad cholesterol remains above goal, would recommend increasing Crestor dosage Maintain strict control of hypertension with blood pressure goal below 130/90 and cholesterol with LDL cholesterol (bad cholesterol) goal below 70 mg/dL.           Thank you for coming to see [place] at [facility]. I hope we have been able to provide you high quality care today.  You may receive a patient satisfaction survey over the next few weeks. We would appreciate your feedback and comments so that we may continue to improve ourselves and the health of our patients.

## 2020-05-26 NOTE — Progress Notes (Signed)
[facility] [address] [place]. [place] [address] [phone]       OFFICE FOLLOW UP NOTE  [name] Date of Birth:  30-Jan-1941 Medical Record Number:  [id number]   Referring MD: Micki Riley  Reason for Referral: TIA  Chief complaint: Chief Complaint  Patient presents with  . Follow-up    rm 5 here for a f/u on a TIA. Pt is having no new sx.       HPI:   Today, 05/26/2020, [name] returns for 60-month follow-up regarding TIA in 01/2020.  He has been stable since prior visit without new or recurring stroke/TIA symptoms.  Completed 24-month DAPT and continues on aspirin alone without bleeding or bruising.  Continues on Crestor 5 mg daily without myalgias. Blood pressure today 138/64.  Continues to follow with PCP for HTN and HLD management.  No concerns at this time.    History provided for reference purposes only Initial visit 02/10/2020 Dr. Leonie Davidson: [name] is a 79 year old pleasant Caucasian male seen today for initial office consultation visit for TIA. History is obtained from the patient and review of referral notes as well as electronic medical records and I personally reviewed imaging films in PACS. He has a past medical history of hyperlipidemia alone. He was admitted to [facility] on 01/19/2020 with sudden onset of left sided numbness and weakness. He states symptoms began when he bent down and try to stand up. He felt off balance and had some trouble walking. He denied any headache, slurred speech, change in vision, facial weakness. Did report some pressure in his head but symptoms resolved quickly within 5 to 10 minutes by the time he reached the hospital. CT scan of the head on admission was unremarkable and MRI scan also did not show an acute infarct but MRI showed severe right middle cerebral artery stenosis and M2 as well as left posterior cerebral artery stenosis. 2D echo was unremarkable. LDL cholesterol was elevated 107 mg percent.  Hemoglobin A1c was 5.2 in January 2021. He was started on aspirin and Plavix for 3 months which is tolerating well without bruising or bleeding. He was also on Crestor 5 mg every other day 3 days a week which was continued and is tolerating it well. He does have some whitecoat hypertension and blood pressure is elevated today at 161/87. He is not on any antihypertensives. He states his blood pressure at home is usually in the 272 systolic range. He has had no further history of TIA strokes. There is no family history of strokes. Does not smoke. He does eat healthy and states he is quite active and exercise regularly. He has no new complaints today.  ROS:   14 system review of systems is positive for no complaints and all other systems negative  PMH:  Past Medical History:  Diagnosis Date  . Allergy    seasonal  . BPH (benign prostatic hypertrophy)   . Cataract    bilateral-removed "back in 90's"  . Eczema   . Hyperlipidemia   . Peyronie's disease   . Retinal tear 2000's   bilateral-surgery to repair.  Marland Kitchen Urticaria     Social History:  Social History   Socioeconomic History  . Marital status: Married    Spouse name: Not on file  . Number of children: 1  . Years of education: Not on file  . Highest education level: Not on file  Occupational History  . Occupation: retired    Fish farm manager:  RETIRED  Tobacco Use  . Smoking status: Former Smoker    Years: 20.00    Types: Cigarettes    Quit date: 11/06/1970    Years since quitting: 49.5  . Smokeless tobacco: Never Used  Vaping Use  . Vaping Use: Never used  Substance and Sexual Activity  . Alcohol use: Not Currently  . Drug use: No  . Sexual activity: Yes  Other Topics Concern  . Not on file  Social History Narrative  . Not on file   Social Determinants of Health   Financial Resource Strain:   . Difficulty of Paying Living Expenses:   Food Insecurity:   . Worried About Charity fundraiser in the Last Year:   . Academic librarian in the Last Year:   Transportation Needs:   . Film/video editor (Medical):   Marland Kitchen Lack of Transportation (Non-Medical):   Physical Activity:   . Days of Exercise per Week:   . Minutes of Exercise per Session:   Stress:   . Feeling of Stress :   Social Connections:   . Frequency of Communication with Friends and Family:   . Frequency of Social Gatherings with Friends and Family:   . Attends Religious Services:   . Active Member of Clubs or Organizations:   . Attends Archivist Meetings:   Marland Kitchen Marital Status:   Intimate Partner Violence:   . Fear of Current or Ex-Partner:   . Emotionally Abused:   Marland Kitchen Physically Abused:   . Sexually Abused:     Medications:   Current Outpatient Medications on File Prior to Visit  Medication Sig Dispense Refill  . ALPRAZolam (XANAX) 0.5 MG tablet Take 1 tablet (0.5 mg total) by mouth at bedtime as needed for anxiety. 30 tablet 1  . aspirin 81 MG chewable tablet Chew 81 mg by mouth daily.    Marland Kitchen azelastine (ASTELIN) 0.1 % nasal spray Place 1 spray into both nostrils 2 (two) times daily. Use in each nostril as directed 30 mL 12  . cetirizine (ZYRTEC) 10 MG tablet Take 10 mg by mouth daily.    . famotidine (PEPCID) 20 MG tablet Take 1 tablet (20 mg total) by mouth daily. 30 tablet 4  . rosuvastatin (CRESTOR) 5 MG tablet Take 1 tablet (5 mg total) by mouth at bedtime. 30 tablet 11  . zolpidem (AMBIEN) 10 MG tablet Take 1 tablet (10 mg total) by mouth at bedtime as needed for sleep. 15 tablet 0   No current facility-administered medications on file prior to visit.    Allergies:   Allergies  Allergen Reactions  . Wellbutrin [Bupropion Hcl] Rash    Today's Vitals   05/26/20 1309  BP: 138/64  Pulse: 81  Weight: 166 lb (75.3 kg)  Height: 6\' 2"  (1.88 m)   Body mass index is 21.31 kg/m.    Physical Exam General: Frail pleasant elderly Caucasian male seated, in no evident distress Head: head normocephalic and atraumatic.   Neck:  supple with no carotid or supraclavicular bruits Cardiovascular: regular rate and rhythm, no murmurs Musculoskeletal: no deformity Skin:  no rash/petichiae Vascular:  Normal pulses all extremities  Neurologic Exam Mental Status: Awake and fully alert. Oriented to place and time. Recent and remote memory intact. Attention span, concentration and fund of knowledge appropriate. Mood and affect appropriate.  Cranial Nerves: Pupils equal, briskly reactive to light. Extraocular movements full without nystagmus. Visual fields full to confrontation. Hearing intact. Facial sensation intact. Face, tongue, palate  moves normally and symmetrically.  Motor: Normal bulk and tone. Normal strength in all tested extremity muscles. Sensory.: intact to touch , pinprick , position and vibratory sensation.  Coordination: Rapid alternating movements normal in all extremities. Finger-to-nose and heel-to-shin performed accurately bilaterally. Gait and Station: Arises from chair without difficulty. Stance is normal. Gait demonstrates normal stride length and balance . Able to heel, toe and tandem walk with slight  difficulty.  Reflexes: 1+ and symmetric. Toes downgoing.    ASSESSMENT/PLAN: 79 year old Caucasian male with right hemispheric TIA in March 2021 with vascular risk factors of intracranial stenosis, hyperlipidemia, age and borderline hypertension      1. TIA: Continue aspirin 81 mg daily  and Crestor 5 mg daily for secondary stroke prevention.  Ensure close PCP follow-up for aggressive stroke risk factor management 2. Borderline HTN: BP goal<130/90.  Not currently on medication regimen.  Continue to follow PCP for monitoring management 3. HLD: LDL goal<70.  Recent lipid panel showed LDL above goal but greatly improved compared to prior panels.  Discussed increasing Crestor dosage but patient declines interest at this time and prefers to continue to modify diet and have PCP recheck in the future.  He was  agreeable to increase Crestor dosage if LDL remains above goal on repeat.  Continue Crestor 5 mg daily and ongoing follow-up with PCP for prescribing, monitoring and management 4. Intracranial stenosis: Continue aspirin and statin along with aggressive stroke risk factor management.  Currently asymptomatic therefore no indication for repeat imaging unless becomes symptomatic   Overall stable from stroke standpoint and per patient request, follow-up on an as-needed basis    I spent 20 minutes of face-to-face and non-face-to-face time with patient.  This included previsit chart review, lab review, study review, order entry, electronic health record documentation, patient education   Frann Rider, [facility]  [facility] [address] [place], [place] [address]  Phone [phone] Fax [phone] Note: This document was prepared with digital dictation and possible smart phrase technology. Any transcriptional errors that result from this process are unintentional.

## 2020-05-29 NOTE — Patient Instructions (Addendum)
It was a pleasure to see you today.  Continue current regimen of Crestor daily as well as aspirin.  Follow-up February 2021.

## 2020-06-29 DIAGNOSIS — L820 Inflamed seborrheic keratosis: Secondary | ICD-10-CM | POA: Diagnosis not present

## 2020-06-29 DIAGNOSIS — L821 Other seborrheic keratosis: Secondary | ICD-10-CM | POA: Diagnosis not present

## 2020-07-01 DIAGNOSIS — R070 Pain in throat: Secondary | ICD-10-CM | POA: Diagnosis not present

## 2020-07-01 DIAGNOSIS — Z20828 Contact with and (suspected) exposure to other viral communicable diseases: Secondary | ICD-10-CM | POA: Diagnosis not present

## 2020-07-01 DIAGNOSIS — R05 Cough: Secondary | ICD-10-CM | POA: Diagnosis not present

## 2020-07-01 DIAGNOSIS — J0100 Acute maxillary sinusitis, unspecified: Secondary | ICD-10-CM | POA: Diagnosis not present

## 2020-09-03 ENCOUNTER — Emergency Department (HOSPITAL_COMMUNITY): Payer: PPO

## 2020-09-03 ENCOUNTER — Encounter (HOSPITAL_COMMUNITY): Payer: Self-pay | Admitting: Emergency Medicine

## 2020-09-03 ENCOUNTER — Inpatient Hospital Stay (HOSPITAL_COMMUNITY)
Admission: EM | Admit: 2020-09-03 | Discharge: 2020-09-07 | DRG: 308 | Disposition: A | Discharge status: Home / Self Care | Payer: PPO | Attending: Internal Medicine | Admitting: Internal Medicine

## 2020-09-03 ENCOUNTER — Other Ambulatory Visit: Payer: Self-pay

## 2020-09-03 DIAGNOSIS — F419 Anxiety disorder, unspecified: Secondary | ICD-10-CM | POA: Diagnosis not present

## 2020-09-03 DIAGNOSIS — R911 Solitary pulmonary nodule: Secondary | ICD-10-CM | POA: Diagnosis not present

## 2020-09-03 DIAGNOSIS — I4892 Unspecified atrial flutter: Secondary | ICD-10-CM | POA: Diagnosis not present

## 2020-09-03 DIAGNOSIS — R739 Hyperglycemia, unspecified: Secondary | ICD-10-CM | POA: Diagnosis not present

## 2020-09-03 DIAGNOSIS — Z87891 Personal history of nicotine dependence: Secondary | ICD-10-CM | POA: Diagnosis not present

## 2020-09-03 DIAGNOSIS — I071 Rheumatic tricuspid insufficiency: Secondary | ICD-10-CM | POA: Diagnosis present

## 2020-09-03 DIAGNOSIS — Z7982 Long term (current) use of aspirin: Secondary | ICD-10-CM

## 2020-09-03 DIAGNOSIS — I509 Heart failure, unspecified: Secondary | ICD-10-CM | POA: Diagnosis not present

## 2020-09-03 DIAGNOSIS — Z20822 Contact with and (suspected) exposure to covid-19: Secondary | ICD-10-CM | POA: Diagnosis not present

## 2020-09-03 DIAGNOSIS — K219 Gastro-esophageal reflux disease without esophagitis: Secondary | ICD-10-CM | POA: Diagnosis not present

## 2020-09-03 DIAGNOSIS — E785 Hyperlipidemia, unspecified: Secondary | ICD-10-CM | POA: Diagnosis not present

## 2020-09-03 DIAGNOSIS — I5031 Acute diastolic (congestive) heart failure: Secondary | ICD-10-CM | POA: Diagnosis not present

## 2020-09-03 DIAGNOSIS — E7849 Other hyperlipidemia: Secondary | ICD-10-CM | POA: Diagnosis not present

## 2020-09-03 DIAGNOSIS — I4891 Unspecified atrial fibrillation: Secondary | ICD-10-CM | POA: Diagnosis not present

## 2020-09-03 DIAGNOSIS — J918 Pleural effusion in other conditions classified elsewhere: Secondary | ICD-10-CM | POA: Diagnosis not present

## 2020-09-03 DIAGNOSIS — R0902 Hypoxemia: Secondary | ICD-10-CM | POA: Diagnosis not present

## 2020-09-03 DIAGNOSIS — J302 Other seasonal allergic rhinitis: Secondary | ICD-10-CM | POA: Diagnosis present

## 2020-09-03 DIAGNOSIS — I081 Rheumatic disorders of both mitral and tricuspid valves: Secondary | ICD-10-CM | POA: Diagnosis not present

## 2020-09-03 DIAGNOSIS — I340 Nonrheumatic mitral (valve) insufficiency: Secondary | ICD-10-CM | POA: Diagnosis present

## 2020-09-03 DIAGNOSIS — J90 Pleural effusion, not elsewhere classified: Secondary | ICD-10-CM | POA: Diagnosis not present

## 2020-09-03 DIAGNOSIS — I4430 Unspecified atrioventricular block: Secondary | ICD-10-CM | POA: Diagnosis present

## 2020-09-03 DIAGNOSIS — J189 Pneumonia, unspecified organism: Secondary | ICD-10-CM

## 2020-09-03 DIAGNOSIS — R000 Tachycardia, unspecified: Secondary | ICD-10-CM | POA: Diagnosis not present

## 2020-09-03 DIAGNOSIS — Z20828 Contact with and (suspected) exposure to other viral communicable diseases: Secondary | ICD-10-CM | POA: Diagnosis not present

## 2020-09-03 DIAGNOSIS — I361 Nonrheumatic tricuspid (valve) insufficiency: Secondary | ICD-10-CM | POA: Diagnosis not present

## 2020-09-03 DIAGNOSIS — Z79899 Other long term (current) drug therapy: Secondary | ICD-10-CM

## 2020-09-03 DIAGNOSIS — N400 Enlarged prostate without lower urinary tract symptoms: Secondary | ICD-10-CM | POA: Diagnosis not present

## 2020-09-03 DIAGNOSIS — Z8673 Personal history of transient ischemic attack (TIA), and cerebral infarction without residual deficits: Secondary | ICD-10-CM | POA: Diagnosis not present

## 2020-09-03 DIAGNOSIS — I483 Typical atrial flutter: Secondary | ICD-10-CM | POA: Diagnosis not present

## 2020-09-03 DIAGNOSIS — R0600 Dyspnea, unspecified: Secondary | ICD-10-CM | POA: Diagnosis not present

## 2020-09-03 DIAGNOSIS — R0602 Shortness of breath: Secondary | ICD-10-CM | POA: Diagnosis not present

## 2020-09-03 DIAGNOSIS — J168 Pneumonia due to other specified infectious organisms: Secondary | ICD-10-CM | POA: Diagnosis not present

## 2020-09-03 DIAGNOSIS — Z888 Allergy status to other drugs, medicaments and biological substances status: Secondary | ICD-10-CM | POA: Diagnosis not present

## 2020-09-03 DIAGNOSIS — J9811 Atelectasis: Secondary | ICD-10-CM | POA: Diagnosis not present

## 2020-09-03 LAB — MAGNESIUM: Magnesium: 2.4 mg/dL (ref 1.7–2.4)

## 2020-09-03 LAB — COMPREHENSIVE METABOLIC PANEL
ALT: 22 U/L (ref 0–44)
AST: 50 U/L — ABNORMAL HIGH (ref 15–41)
Albumin: 4.2 g/dL (ref 3.5–5.0)
Alkaline Phosphatase: 93 U/L (ref 38–126)
Anion gap: 11 (ref 5–15)
BUN: 13 mg/dL (ref 8–23)
CO2: 22 mmol/L (ref 22–32)
Calcium: 8.9 mg/dL (ref 8.9–10.3)
Chloride: 104 mmol/L (ref 98–111)
Creatinine, Ser: 1.03 mg/dL (ref 0.61–1.24)
GFR, Estimated: >60 mL/min (ref >60)
Glucose, Bld: 98 mg/dL (ref 70–99)
Potassium: 5.9 mmol/L — ABNORMAL HIGH (ref 3.5–5.1)
Sodium: 137 mmol/L (ref 135–145)
Total Bilirubin: 1.6 mg/dL — ABNORMAL HIGH (ref 0.3–1.2)
Total Protein: 8.1 g/dL (ref 6.5–8.1)

## 2020-09-03 LAB — URINALYSIS, ROUTINE W REFLEX MICROSCOPIC
Bacteria, UA: NONE SEEN
Bilirubin Urine: NEGATIVE
Glucose, UA: NEGATIVE mg/dL
Ketones, ur: NEGATIVE mg/dL
Leukocytes,Ua: NEGATIVE
Nitrite: NEGATIVE
Protein, ur: NEGATIVE mg/dL
Specific Gravity, Urine: 1.021 (ref 1.005–1.030)
pH: 7 (ref 5.0–8.0)

## 2020-09-03 LAB — CBC
HCT: 48.8 % (ref 39.0–52.0)
Hemoglobin: 16.1 g/dL (ref 13.0–17.0)
MCH: 29.8 pg (ref 26.0–34.0)
MCHC: 33 g/dL (ref 30.0–36.0)
MCV: 90.2 fL (ref 80.0–100.0)
Platelets: 339 10*3/uL (ref 150–400)
RBC: 5.41 MIL/uL (ref 4.22–5.81)
RDW: 13.6 % (ref 11.5–15.5)
WBC: 8.4 10*3/uL (ref 4.0–10.5)
nRBC: 0 % (ref 0.0–0.2)

## 2020-09-03 LAB — RESPIRATORY PANEL BY RT PCR (FLU A&B, COVID)
Influenza A by PCR: NEGATIVE
Influenza B by PCR: NEGATIVE
SARS Coronavirus 2 by RT PCR: NEGATIVE

## 2020-09-03 LAB — LIPASE, BLOOD: Lipase: 27 U/L (ref 11–51)

## 2020-09-03 LAB — BRAIN NATRIURETIC PEPTIDE: B Natriuretic Peptide: 57.4 pg/mL (ref 0.0–100.0)

## 2020-09-03 LAB — POTASSIUM: Potassium: 4 mmol/L (ref 3.5–5.1)

## 2020-09-03 MED ORDER — ROSUVASTATIN CALCIUM 10 MG PO TABS
5.0000 mg | ORAL_TABLET | Freq: Every day | ORAL | Status: DC
  Administered 2020-09-04 – 2020-09-07 (×4): 5 mg via ORAL
  Filled 2020-09-03 (×4): qty 1

## 2020-09-03 MED ORDER — ONDANSETRON HCL 4 MG/2ML IJ SOLN: 4.0000 mg | Freq: Four times a day (QID) | INTRAMUSCULAR | Status: DC | PRN

## 2020-09-03 MED ORDER — DOXYCYCLINE HYCLATE 100 MG PO TABS
100.0000 mg | ORAL_TABLET | Freq: Two times a day (BID) | ORAL | Status: DC
  Administered 2020-09-04: 100 mg via ORAL
  Filled 2020-09-03: qty 1

## 2020-09-03 MED ORDER — LORATADINE 10 MG PO TABS
10.0000 mg | ORAL_TABLET | Freq: Every day | ORAL | Status: DC
  Administered 2020-09-04 – 2020-09-07 (×4): 10 mg via ORAL
  Filled 2020-09-03 (×4): qty 1

## 2020-09-03 MED ORDER — METOPROLOL TARTRATE 5 MG/5ML IV SOLN: 5.0000 mg | Freq: Once | INTRAVENOUS | Status: AC

## 2020-09-03 MED ORDER — HEPARIN (PORCINE) 25000 UT/250ML-% IV SOLN
1300.0000 [IU]/h | INTRAVENOUS | Status: DC
  Administered 2020-09-04: 1050 [IU]/h via INTRAVENOUS
  Filled 2020-09-03 (×2): qty 250

## 2020-09-03 MED ORDER — IOHEXOL 350 MG/ML SOLN
100.0000 mL | Freq: Once | INTRAVENOUS | Status: AC | PRN
  Administered 2020-09-03: 100 mL via INTRAVENOUS

## 2020-09-03 MED ORDER — ACETAMINOPHEN 325 MG PO TABS: 650.0000 mg | ORAL_TABLET | ORAL | Status: DC | PRN

## 2020-09-03 MED ORDER — DILTIAZEM HCL-DEXTROSE 125-5 MG/125ML-% IV SOLN (PREMIX)
5.0000 mg/h | INTRAVENOUS | Status: DC
  Administered 2020-09-03: 5 mg/h via INTRAVENOUS
  Administered 2020-09-04: 15 mg/h via INTRAVENOUS
  Filled 2020-09-03 (×2): qty 125

## 2020-09-03 MED ORDER — ASPIRIN EC 81 MG PO TBEC: 81.0000 mg | DELAYED_RELEASE_TABLET | Freq: Every day | ORAL | Status: DC

## 2020-09-03 MED ORDER — AZITHROMYCIN 250 MG PO TABS
250.0000 mg | ORAL_TABLET | Freq: Every day | ORAL | Status: DC
Start: 2020-09-05 — End: 2020-09-03

## 2020-09-03 MED ORDER — SODIUM CHLORIDE 0.9 % IV BOLUS
1000.0000 mL | Freq: Once | INTRAVENOUS | Status: AC
  Administered 2020-09-03: 1000 mL via INTRAVENOUS

## 2020-09-03 MED ORDER — METOPROLOL TARTRATE 25 MG PO TABS
25.0000 mg | ORAL_TABLET | Freq: Four times a day (QID) | ORAL | Status: DC
  Administered 2020-09-04 – 2020-09-05 (×5): 25 mg via ORAL
  Filled 2020-09-03 (×5): qty 1

## 2020-09-03 MED ORDER — AZITHROMYCIN 250 MG PO TABS
500.0000 mg | ORAL_TABLET | Freq: Every day | ORAL | Status: DC
Start: 2020-09-04 — End: 2020-09-03

## 2020-09-03 MED ORDER — FAMOTIDINE 20 MG PO TABS
20.0000 mg | ORAL_TABLET | Freq: Every day | ORAL | Status: DC
  Administered 2020-09-04 – 2020-09-05 (×2): 20 mg via ORAL
  Filled 2020-09-03 (×2): qty 1

## 2020-09-03 MED ORDER — METOPROLOL TARTRATE 5 MG/5ML IV SOLN
INTRAVENOUS | Status: AC
  Administered 2020-09-03: 5 mg via INTRAVENOUS
  Filled 2020-09-03: qty 5

## 2020-09-03 MED ORDER — HEPARIN BOLUS VIA INFUSION
3500.0000 [IU] | Freq: Once | INTRAVENOUS | Status: AC
  Administered 2020-09-04: 3500 [IU] via INTRAVENOUS
  Filled 2020-09-03: qty 3500

## 2020-09-03 NOTE — Progress Notes (Signed)
[facility] for Heparin Indication: atrial fibrillation  Allergies  Allergen Reactions  . Wellbutrin [Bupropion Hcl] Rash    Patient Measurements: Height: 6\' 2"  (188 cm) Weight: 76 kg (167 lb 8.8 oz) IBW/kg (Calculated) : 82.2 Heparin Dosing Weight: 76 kg  Vital Signs: Temp: 98.5 F (36.9 C) (10/29 1711) Temp Source: Oral (10/29 1711) BP: 122/109 (10/29 2126) Pulse Rate: 143 (10/29 2126)  Labs: Recent Labs    09/03/20 1738  HGB 16.1  HCT 48.8  PLT 339  CREATININE 1.03    Estimated Creatinine Clearance: 63.5 mL/min (by C-G formula based on SCr of 1.03 mg/dL).   Medical History: Past Medical History:  Diagnosis Date  . Allergy    seasonal  . BPH (benign prostatic hypertrophy)   . Cataract    bilateral-removed "back in 90's"  . Eczema   . Hyperlipidemia   . Peyronie's disease   . Retinal tear 2000's   bilateral-surgery to repair.  Marland Kitchen Urticaria     Medications:  No PTA anticoagulants per med history  Assessment: 79 y/o M admitted with tachycardia. Pharmacy consulted to dose IV UFH for atrial fibrillation.   Goal of Therapy:  Heparin level 0.3-0.7 units/ml Monitor platelets by anticoagulation protocol: Yes   Plan:  Give 3500 units bolus x 1 Start heparin infusion at 1050 units/hr Check anti-Xa level in 8 hours and daily while on heparin Continue to monitor H&H and platelets  Ulice Dash D 09/03/2020,9:48 PM

## 2020-09-03 NOTE — Consult Note (Signed)
Referring Physician: Dr. Dewain Penning  [name] is an 79 y.o. male.                       Chief Complaint: Atrial flutter with RVR  HPI: 79 years old white male with flu-like symptoms went to urgent care for antibiotics for sinus infection. His heart rate was in 140's and he was sent to ER. He is not aware of fast heart rhythm prior to this. His blood work is near normal including CBC, CMET, Lipase, BNP. He denies prior h/o atrial fibrillation. TSH was normal 8 months ago. He also denies taking any psuedafed or   Past Medical History:  Diagnosis Date  . Allergy    seasonal  . BPH (benign prostatic hypertrophy)   . Cataract    bilateral-removed "back in 90's"  . Eczema   . Hyperlipidemia   . Peyronie's disease   . Retinal tear 2000's   bilateral-surgery to repair.  Marland Kitchen Urticaria       Past Surgical History:  Procedure Laterality Date  . COLONOSCOPY    . EYE SURGERY Bilateral 06/08   retinal detachment OS  . EYE SURGERY Bilateral 1994   cataract OU  . TONSILLECTOMY      Family History  Problem Relation Age of Onset  . Cancer Father        back muscle   . Other Father        pituatary tumor  . Lung cancer Father   . Colon polyps Mother   . High blood pressure Brother   . Colon cancer Neg Hx   . Esophageal cancer Neg Hx   . Rectal cancer Neg Hx   . Stomach cancer Neg Hx    Social History:  reports that he quit smoking about 49 years ago. His smoking use included cigarettes. He quit after 20.00 years of use. He has never used smokeless tobacco. He reports previous alcohol use. He reports that he does not use drugs.  Allergies:  Allergies  Allergen Reactions  . Wellbutrin [Bupropion Hcl] Rash    (Not in a hospital admission)   Results for orders placed or performed during the hospital encounter of 09/03/20 (from the past 48 hour(s))  Urinalysis, Routine w reflex microscopic Urine, Clean Catch     Status: Abnormal   Collection Time: 09/03/20  5:23 PM   Result Value Ref Range   Color, Urine STRAW (A) YELLOW   APPearance CLEAR CLEAR   Specific Gravity, Urine 1.021 1.005 - 1.030   pH 7.0 5.0 - 8.0   Glucose, UA NEGATIVE NEGATIVE mg/dL   Hgb urine dipstick SMALL (A) NEGATIVE   Bilirubin Urine NEGATIVE NEGATIVE   Ketones, ur NEGATIVE NEGATIVE mg/dL   Protein, ur NEGATIVE NEGATIVE mg/dL   Nitrite NEGATIVE NEGATIVE   Leukocytes,Ua NEGATIVE NEGATIVE   RBC / HPF 0-5 0 - 5 RBC/hpf   WBC, UA 0-5 0 - 5 WBC/hpf   Bacteria, UA NONE SEEN NONE SEEN   Squamous Epithelial / LPF 0-5 0 - 5    Comment: Performed at [facility] [place] [address] [place], [place] [address]  Lipase, blood     Status: None   Collection Time: 09/03/20  5:38 PM  Result Value Ref Range   Lipase 27 11 - 51 U/L    Comment: Performed at [facility], [place] [address], [place], [place] [address]  Comprehensive metabolic panel     Status: Abnormal  Collection Time: 09/03/20  5:38 PM  Result Value Ref Range   Sodium 137 135 - 145 mmol/L   Potassium 5.9 (H) 3.5 - 5.1 mmol/L   Chloride 104 98 - 111 mmol/L   CO2 22 22 - 32 mmol/L   Glucose, Bld 98 70 - 99 mg/dL    Comment: Glucose reference range applies only to samples taken after fasting for at least 8 hours.   BUN 13 8 - 23 mg/dL   Creatinine, Ser 1.03 0.61 - 1.24 mg/dL   Calcium 8.9 8.9 - 10.3 mg/dL   Total Protein 8.1 6.5 - 8.1 g/dL   Albumin 4.2 3.5 - 5.0 g/dL   AST 50 (H) 15 - 41 U/L   ALT 22 0 - 44 U/L   Alkaline Phosphatase 93 38 - 126 U/L   Total Bilirubin 1.6 (H) 0.3 - 1.2 mg/dL   GFR, Estimated >60 >60 mL/min    Comment: (NOTE) Calculated using the CKD-EPI Creatinine Equation (2021)    Anion gap 11 5 - 15    Comment: Performed at [facility], [place] [address] [place], [place] [address]  CBC     Status: None   Collection Time: 09/03/20  5:38 PM  Result Value Ref Range   WBC 8.4 4.0 - 10.5 K/uL   RBC 5.41 4.22 - 5.81 MIL/uL   Hemoglobin 16.1 13.0 -  17.0 g/dL   HCT 48.8 39 - 52 %   MCV 90.2 80.0 - 100.0 fL   MCH 29.8 26.0 - 34.0 pg   MCHC 33.0 30.0 - 36.0 g/dL   RDW 13.6 11.5 - 15.5 %   Platelets 339 150 - 400 K/uL   nRBC 0.0 0.0 - 0.2 %    Comment: Performed at [facility], [place] [address], [place], [place] [address]  Brain natriuretic peptide     Status: None   Collection Time: 09/03/20  5:38 PM  Result Value Ref Range   B Natriuretic Peptide 57.4 0.0 - 100.0 pg/mL    Comment: Performed at [facility], [place] [address], [place], [place] [address]  Magnesium     Status: None   Collection Time: 09/03/20  6:43 PM  Result Value Ref Range   Magnesium 2.4 1.7 - 2.4 mg/dL    Comment: Performed at [facility], [place] [address] [place], [place] [address]  Potassium     Status: None   Collection Time: 09/03/20  6:43 PM  Result Value Ref Range   Potassium 4.0 3.5 - 5.1 mmol/L    Comment: DELTA CHECK NOTED Performed at [place] [address], [place], [place] [address]   Respiratory Panel by RT PCR (Flu A&B, Covid) - Nasopharyngeal Swab     Status: None   Collection Time: 09/03/20  8:02 PM   Specimen: Nasopharyngeal Swab  Result Value Ref Range   SARS Coronavirus 2 by RT PCR NEGATIVE NEGATIVE    Comment: (NOTE) SARS-CoV-2 target nucleic acids are NOT DETECTED.  The SARS-CoV-2 RNA is generally detectable in upper respiratoy specimens during the acute phase of infection. The lowest concentration of SARS-CoV-2 viral copies this assay can detect is 131 copies/mL. A negative result does not preclude SARS-Cov-2 infection and should not be used as the sole basis for treatment or other patient management decisions. A negative result may occur with  improper specimen collection/handling, submission of specimen other than nasopharyngeal swab, presence of viral mutation(s) within the areas targeted by this  assay, and inadequate number of viral copies (<131  copies/mL). A negative result must be combined with clinical observations, patient history, and epidemiological information. The expected result is Negative.  Fact Sheet for Patients:  PinkCheek.be  Fact Sheet for Healthcare Providers:  GravelBags.it  This test is no t yet approved or cleared by the [place] FDA and  has been authorized for detection and/or diagnosis of SARS-CoV-2 by FDA under an Emergency Use Authorization (EUA). This EUA will remain  in effect (meaning this test can be used) for the duration of the COVID-19 declaration under Section 564(b)(1) of the Act, 21 U.S.C. section 360bbb-3(b)(1), unless the authorization is terminated or revoked sooner.     Influenza A by PCR NEGATIVE NEGATIVE   Influenza B by PCR NEGATIVE NEGATIVE    Comment: (NOTE) The Xpert Xpress SARS-CoV-2/FLU/RSV assay is intended as an aid in  the diagnosis of influenza from Nasopharyngeal swab specimens and  should not be used as a sole basis for treatment. Nasal washings and  aspirates are unacceptable for Xpert Xpress SARS-CoV-2/FLU/RSV  testing.  Fact Sheet for Patients: PinkCheek.be  Fact Sheet for Healthcare Providers: GravelBags.it  This test is not yet approved or cleared by the [place] FDA and  has been authorized for detection and/or diagnosis of SARS-CoV-2 by  FDA under an Emergency Use Authorization (EUA). This EUA will remain  in effect (meaning this test can be used) for the duration of the  Covid-19 declaration under Section 564(b)(1) of the Act, 21  U.S.C. section 360bbb-3(b)(1), unless the authorization is  terminated or revoked. Performed at [facility], [place] [address] [place], [place] [address]    DG Chest 2 View  Result Date: 09/03/2020 CLINICAL DATA:  Short of breath EXAM: CHEST - 2 VIEW COMPARISON:  01/17/1999 21  FINDINGS: Small bilateral pleural effusions. No consolidation. Normal heart size. No pneumothorax. IMPRESSION: Small bilateral pleural effusions. Electronically Signed   By: Donavan Foil M.D.   On: 09/03/2020 17:29   CT Angio Chest PE W and/or Wo Contrast  Result Date: 09/03/2020 CLINICAL DATA:  Tachycardia, dyspnea EXAM: CT ANGIOGRAPHY CHEST WITH CONTRAST TECHNIQUE: Multidetector CT imaging of the chest was performed using the standard protocol during bolus administration of intravenous contrast. Multiplanar CT image reconstructions and MIPs were obtained to evaluate the vascular anatomy. CONTRAST:  174mL OMNIPAQUE IOHEXOL 350 MG/ML SOLN COMPARISON:  None. FINDINGS: Cardiovascular: There is adequate opacification of the pulmonary arterial tree. There is no intraluminal filling defect identified to suggest acute pulmonary embolism. The central pulmonary arteries are mildly enlarged in keeping with changes of pulmonary arterial hypertension. Minimal coronary artery calcification. Global cardiac size is within normal limits. The thoracic aorta is of normal caliber. Mild atherosclerotic calcification is seen scattered throughout the thoracic aorta. Mediastinum/Nodes: Thyroid unremarkable. No pathologic thoracic adenopathy. Esophagus unremarkable. Lungs/Pleura: Moderate to large bilateral dependently layering pleural effusions are present with compressive atelectasis of the dependent lungs bilaterally. There is superimposed asymmetric biapical ground-glass pulmonary infiltrate, more severe within the left upper lobe, suspicious for acute infection the appropriate clinical setting. There is associated bronchial wall thickening asymmetrically involving the upper lobes bilaterally in keeping with airway inflammation. A a 13 mm intrapleural pulmonary nodule may represent a enlarged intrapulmonary lymph node. No central obstructing lesion. No pneumothorax. Upper Abdomen: No acute abnormality. Musculoskeletal: No lytic  or blastic bone lesions. No acute bone abnormality. Review of the MIP images confirms the above findings. IMPRESSION: No pulmonary embolism. Moderate to large bilateral dependent, bland  appearing pleural effusions with associated bilateral compressive atelectasis. Superimposed biapical pulmonary infiltrates and associated bronchial wall thickening suspicious for changes of atypical infection in the appropriate clinical setting. Aortic aneurysm NOS (ICD10-I71.9). Electronically Signed   By: Fidela Salisbury MD   On: 09/03/2020 19:38    Review Of Systems Constitutional: No fever, chills, weight loss or gain. Eyes: No vision change, wears glasses. No discharge or pain. Ears: No hearing loss, No tinnitus. Respiratory: No asthma, COPD, pneumonias. No shortness of breath. No hemoptysis. Cardiovascular: No chest pain, ? palpitation, leg edema. Gastrointestinal: No nausea, vomiting, diarrhea, constipation. No GI bleed. No hepatitis. Genitourinary: No dysuria, hematuria, kidney stone. No incontinance. Neurological: No headache, stroke, seizures. H/O TIA. Psychiatry: No psych facility admission for anxiety, depression, suicide. No detox. Skin: No rash. Musculoskeletal: No joint pain, fibromyalgia. No neck pain, back pain. Lymphadenopathy: No lymphadenopathy. Hematology: No anemia or easy bruising.   Blood pressure (!) 151/93, pulse (!) 139, temperature 98.5 F (36.9 C), temperature source Oral, resp. rate 16, height 6\' 2"  (1.88 m), weight 76 kg, SpO2 90 %. Body mass index is 21.51 kg/m. General appearance: alert, cooperative, appears stated age and no distress Head: Normocephalic, atraumatic. Eyes: Blue eyes, pink conjunctiva, corneas clear. PERRL, EOM's intact. Neck: No adenopathy, no carotid bruit, no JVD, supple, symmetrical, trachea midline and thyroid not enlarged. Resp: Clear to auscultation bilaterally. Cardio: Tachycardiac, Regular rate and rhythm, S1, S2 normal, II/VI systolic murmur, no  click, rub or gallop GI: Soft, non-tender; bowel sounds normal; no organomegaly. Extremities: No edema, cyanosis or clubbing. Skin: Warm and dry.  Neurologic: Alert and oriented X 3, normal strength. Normal coordination and gait.  Assessment/Plan Atrial flutter with 2:1 conduction, CHA2DS2VASc score 5 Atypical pneumonia Parapneumonic pleural effusions  IV diltiazem followed by PO diltiazem. IV followed by PO metoprolol for rate control. Anticoagulation. Echocardiogram.  Check TSH.  Time spent: Review of old records, Lab, x-rays, EKG, other cardiac tests, examination, discussion with patient over 70 minutes.  Birdie Riddle, MD  09/03/2020, 10:45 PM

## 2020-09-03 NOTE — ED Triage Notes (Signed)
Patient sent by UC to rule out a blood clot, stated his HR was 140s while resting. Pt reported diarrhea Tuesday and Wednesday, but has since resolved. Denies CP, N/V/D, endorses mild SOB. Ambulatory w/o distress in triage.

## 2020-09-03 NOTE — ED Provider Notes (Signed)
[facility] DEPT Provider Note   CSN: [id number] Arrival date & time: 09/03/20  1703     History Chief Complaint  Patient presents with  . Tachycardia  . Shortness of Breath    [name] is a 79 y.o. male presented emergency department with fatigue and tachycardia.  The patient seen in urgent care today because he was complaining of "flu symptoms" at that point was noted to be tachycardic.  He was referred to emergency department for further evaluation.  He says he been feeling fatigued all week.  He describes feeling rundown, having diarrhea, and like his not drinking enough fluids.  He did not receive the COVID vaccine.  He has received a flu vaccine.  He denies any history of PE, DVT, calf pain or swelling.  He denies any history of A. fib, MI, cardiac palpitations.  He states he normally is very active.  However he has noticed for the past week he has not been able to do his normal functional activities without feeling winded and tired.  HPI     Past Medical History:  Diagnosis Date  . Allergy    seasonal  . BPH (benign prostatic hypertrophy)   . Cataract    bilateral-removed "back in 90's"  . Eczema   . Hyperlipidemia   . Peyronie's disease   . Retinal tear 2000's   bilateral-surgery to repair.  Marland Kitchen Urticaria     Patient Active Problem List   Diagnosis Date Noted  . Atrial flutter ([place]) 09/03/2020  . New onset of congestive heart failure ([place]) 09/03/2020  . Pleural effusion 09/03/2020  . Fracture of proximal phalanx of finger 03/21/2018  . Lumbar back pain with radiculopathy affecting right lower extremity 02/23/2016  . GERD (gastroesophageal reflux disease) 01/30/2015  . Insomnia 01/30/2015  . Benign prostatic hyperplasia with urinary obstruction 11/16/2014  . Benign fibroma of prostate 11/04/2012  . Bladder neck obstruction 11/04/2012  . Elevated prostate specific antigen (PSA) 11/04/2012  . ED (erectile dysfunction) of  organic origin 11/04/2012  . Anxiety 07/06/2011  . Hyperlipidemia 07/06/2011  . BPH (benign prostatic hyperplasia) 07/06/2011  . Erectile dysfunction 07/06/2011  . Peyronie's disease 07/06/2011    Past Surgical History:  Procedure Laterality Date  . COLONOSCOPY    . EYE SURGERY Bilateral 06/08   retinal detachment OS  . EYE SURGERY Bilateral 1994   cataract OU  . TONSILLECTOMY         Family History  Problem Relation Age of Onset  . Cancer Father        back muscle   . Other Father        pituatary tumor  . Lung cancer Father   . Colon polyps Mother   . High blood pressure Brother   . Colon cancer Neg Hx   . Esophageal cancer Neg Hx   . Rectal cancer Neg Hx   . Stomach cancer Neg Hx     Social History   Tobacco Use  . Smoking status: Former Smoker    Years: 20.00    Types: Cigarettes    Quit date: 11/06/1970    Years since quitting: 49.8  . Smokeless tobacco: Never Used  Vaping Use  . Vaping Use: Never used  Substance Use Topics  . Alcohol use: Not Currently  . Drug use: No    Home Medications Prior to Admission medications   Medication Sig Start Date End Date Taking? Authorizing Provider  aspirin EC 81 MG tablet Take 81  mg by mouth daily.   Yes [provider]  cetirizine (ZYRTEC) 10 MG tablet Take 10 mg by mouth daily.   Yes [provider]  famotidine (PEPCID) 20 MG tablet Take 1 tablet (20 mg total) by mouth daily. 02/24/19  Yes Kozlow, Donnamarie Poag, MD  rosuvastatin (CRESTOR) 5 MG tablet Take 1 tablet (5 mg total) by mouth at bedtime. Patient taking differently: Take 5 mg by mouth daily.  02/10/20 02/09/21 Yes Garvin Fila, MD  zolpidem (AMBIEN) 10 MG tablet Take 1 tablet (10 mg total) by mouth at bedtime as needed for sleep. 09/06/17 05/14/20  [name], MD    Allergies    Wellbutrin [bupropion hcl]  Review of Systems   Review of Systems  Constitutional: Positive for fatigue. Negative for chills and fever.  Eyes: Negative for  photophobia and visual disturbance.  Respiratory: Positive for shortness of breath. Negative for cough.   Cardiovascular: Negative for chest pain and palpitations.  Gastrointestinal: Negative for abdominal pain, nausea and vomiting.  Genitourinary: Negative for dysuria and hematuria.  Musculoskeletal: Negative for arthralgias and back pain.  Skin: Negative for color change and rash.  Neurological: Negative for syncope and light-headedness.  Psychiatric/Behavioral: Negative for agitation and decreased concentration.  All other systems reviewed and are negative.   Physical Exam Updated Vital Signs BP (!) 151/93   Pulse (!) 139   Temp 98.5 F (36.9 C) (Oral)   Resp 16   Ht 6\' 2"  (1.88 m)   Wt 76 kg   SpO2 90%   BMI 21.51 kg/m   Physical Exam Vitals and nursing note reviewed.  Constitutional:      Appearance: He is well-developed.  HENT:     Head: Normocephalic and atraumatic.  Eyes:     Conjunctiva/sclera: Conjunctivae normal.  Cardiovascular:     Rate and Rhythm: Regular rhythm. Tachycardia present.     Heart sounds: No murmur heard.   Pulmonary:     Effort: Pulmonary effort is normal. No respiratory distress.     Breath sounds: Normal breath sounds.  Abdominal:     Palpations: Abdomen is soft.     Tenderness: There is no abdominal tenderness.  Musculoskeletal:     Cervical back: Neck supple.  Skin:    General: Skin is warm and dry.  Neurological:     General: No focal deficit present.     Mental Status: He is alert and oriented to person, place, and time.  Psychiatric:        Mood and Affect: Mood normal.        Behavior: Behavior normal.     ED Results / Procedures / Treatments   Labs (all labs ordered are listed, but only abnormal results are displayed) Labs Reviewed  COMPREHENSIVE METABOLIC PANEL - Abnormal; Notable for the following components:      Result Value   Potassium 5.9 (*)    AST 50 (*)    Total Bilirubin 1.6 (*)    All other components  within normal limits  URINALYSIS, ROUTINE W REFLEX MICROSCOPIC - Abnormal; Notable for the following components:   Color, Urine STRAW (*)    Hgb urine dipstick SMALL (*)    All other components within normal limits  RESPIRATORY PANEL BY RT PCR (FLU A&B, COVID)  LIPASE, BLOOD  CBC  MAGNESIUM  POTASSIUM  BRAIN NATRIURETIC PEPTIDE  PROCALCITONIN  LACTATE DEHYDROGENASE  CBC    EKG EKG Interpretation  Date/Time:  Friday September 03 2020 17:12:22 EDT Ventricular  Rate:  145 PR Interval:    QRS Duration: 137 QT Interval:  346 QTC Calculation: 538 R Axis:   76 Text Interpretation: Wide-QRS tachycardia IVCD, consider atypical RBBB 12 Lead; [place] No STEMI Confirmed by Octaviano Glow [phone]) on 09/03/2020 6:30:45 PM   Radiology DG Chest 2 View  Result Date: 09/03/2020 CLINICAL DATA:  Short of breath EXAM: CHEST - 2 VIEW COMPARISON:  01/17/1999 21 FINDINGS: Small bilateral pleural effusions. No consolidation. Normal heart size. No pneumothorax. IMPRESSION: Small bilateral pleural effusions. Electronically Signed   By: Donavan Foil M.D.   On: 09/03/2020 17:29   CT Angio Chest PE W and/or Wo Contrast  Result Date: 09/03/2020 CLINICAL DATA:  Tachycardia, dyspnea EXAM: CT ANGIOGRAPHY CHEST WITH CONTRAST TECHNIQUE: Multidetector CT imaging of the chest was performed using the standard protocol during bolus administration of intravenous contrast. Multiplanar CT image reconstructions and MIPs were obtained to evaluate the vascular anatomy. CONTRAST:  144mL OMNIPAQUE IOHEXOL 350 MG/ML SOLN COMPARISON:  None. FINDINGS: Cardiovascular: There is adequate opacification of the pulmonary arterial tree. There is no intraluminal filling defect identified to suggest acute pulmonary embolism. The central pulmonary arteries are mildly enlarged in keeping with changes of pulmonary arterial hypertension. Minimal coronary artery calcification. Global cardiac size is within normal limits. The thoracic aorta  is of normal caliber. Mild atherosclerotic calcification is seen scattered throughout the thoracic aorta. Mediastinum/Nodes: Thyroid unremarkable. No pathologic thoracic adenopathy. Esophagus unremarkable. Lungs/Pleura: Moderate to large bilateral dependently layering pleural effusions are present with compressive atelectasis of the dependent lungs bilaterally. There is superimposed asymmetric biapical ground-glass pulmonary infiltrate, more severe within the left upper lobe, suspicious for acute infection the appropriate clinical setting. There is associated bronchial wall thickening asymmetrically involving the upper lobes bilaterally in keeping with airway inflammation. A a 13 mm intrapleural pulmonary nodule may represent a enlarged intrapulmonary lymph node. No central obstructing lesion. No pneumothorax. Upper Abdomen: No acute abnormality. Musculoskeletal: No lytic or blastic bone lesions. No acute bone abnormality. Review of the MIP images confirms the above findings. IMPRESSION: No pulmonary embolism. Moderate to large bilateral dependent, bland appearing pleural effusions with associated bilateral compressive atelectasis. Superimposed biapical pulmonary infiltrates and associated bronchial wall thickening suspicious for changes of atypical infection in the appropriate clinical setting. Aortic aneurysm NOS (ICD10-I71.9). Electronically Signed   By: Fidela Salisbury MD   On: 09/03/2020 19:38    Procedures .Critical Care Performed by: Wyvonnia Dusky, MD Authorized by: Wyvonnia Dusky, MD   Critical care provider statement:    Critical care time (minutes):  45   Critical care was necessary to treat or prevent imminent or life-threatening deterioration of the following conditions:  Circulatory failure   Critical care was time spent personally by me on the following activities:  Discussions with consultants, evaluation of patient's response to treatment, examination of patient, ordering and  performing treatments and interventions, ordering and review of laboratory studies, ordering and review of radiographic studies, pulse oximetry, re-evaluation of patient's condition, obtaining history from patient or surrogate and review of old charts Comments:     A Flutter requiring IV diltiazem infusion   (including critical care time)  Medications Ordered in ED Medications  diltiazem (CARDIZEM) 125 mg in dextrose 5% 125 mL (1 mg/mL) infusion (15 mg/hr Intravenous Rate/Dose Change 09/03/20 2208)  rosuvastatin (CRESTOR) tablet 5 mg (has no administration in time range)  aspirin EC tablet 81 mg (has no administration in time range)  famotidine (PEPCID) tablet 20 mg (has no administration in  time range)  loratadine (CLARITIN) tablet 10 mg (has no administration in time range)  heparin ADULT infusion 100 units/mL (25000 units/251mL sodium chloride 0.45%) (has no administration in time range)  heparin bolus via infusion 3,500 Units (has no administration in time range)  sodium chloride 0.9 % bolus 1,000 mL (0 mLs Intravenous Stopped 09/03/20 2208)  iohexol (OMNIPAQUE) 350 MG/ML injection 100 mL (100 mLs Intravenous Contrast Given 09/03/20 1911)    ED Course  I have reviewed the triage vital signs and the nursing notes.  Pertinent labs & imaging results that were available during my care of the patient were reviewed by me and considered in my medical decision making (see chart for details).  This patient complains of dyspnea, fatigue, cough, shortness of breath, and tachycardia.  This involves an extensive number of treatment options, and is a complaint that carries with it a high risk of complications and morbidity.  The differential diagnosis includes PE vs CHF (new onset) vs Arrhythmia (A Flutter) vs anemia vs infection vs other  HR persistently 140 on arrival.  Ordered 1L IVF in case this is dehydration related, but ddx also includes sinus tachycardia vs A Flutter.  Will check for  PE.  ECG shows possible sinus tachycardia vs A Flutter with no acute ischemic changes per my interpretation.  Doubt ACS/MI at this time.  [name] was evaluated in Emergency Department on 09/03/2020 for the symptoms described in the history of present illness. He was evaluated in the context of the global COVID-19 pandemic, which necessitated consideration that the patient might be at risk for infection with the SARS-CoV-2 virus that causes COVID-19. Institutional protocols and algorithms that pertain to the evaluation of patients at risk for COVID-19 are in a state of rapid change based on information released by regulatory bodies including the CDC and federal and state organizations. These policies and algorithms were followed during the patient's care in the ED.   I ordered, reviewed, and interpreted labs, which included Mg 2.4, BMP (K erroneously high, repeat K 4.0), BNP 57, Lipase 27, CBC unremarkable.  COVID/Flu test pending.  UA negative for infection I ordered medication IV fluids NS and IV diltiazem for dehydration and arrhymia I ordered imaging studies which included DG chest, CT PE I independently visualized and interpreted imaging which showed moderate bilateral pleural effusion, no acute CT PE, atypical infiltrates, and the monitor tracing which showed sinus tachycardia vs A Flutter I consulted cardiology and discussed lab and imaging findings  After the interventions stated above, I reevaluated the patient and found he remained in persistent tachycardia.  Stable on room air.   BP stable.  Patient to be admitted for further workup and cardiology evaluation.   Clinical Course as of Sep 03 2216  Fri Sep 03, 2020  2001 Repeat potassium 4.0 - suspect initial potassium of 5.9 was hemolyzed specimen   [MT]  2036 Ordered IV diltiazem for suspected A Flutter.  With large pleural effusions and infection type pattern, he'll need admission to the hospital for cardiac evaluation.  Covid  swab pending.   [MT]  2213 Admitted to hospitalist.  Pt on diltiazem drip with no rate change - still HR 140.  I spoke to Dr [name] from cardiology who will evaluate patient and make further recommendations   [MT]  2213 Pt updated on results and agreeable with plan for admission   [MT]    Clinical Course User Index [MT] Maleka Contino, Carola Rhine, MD    Final Clinical Impression(s) /  ED Diagnoses Final diagnoses:  Pleural effusion  Tachycardia    Rx / DC Orders ED Discharge Orders    None       Wyvonnia Dusky, MD 09/03/20 2217

## 2020-09-03 NOTE — H&P (Addendum)
History and Physical    [name] HUT:[id number] DOB: [birth year] DOA: 09/03/2020  PCP: [name], MD  Patient coming from: Home  I have personally briefly reviewed patient's old medical records in [place]  Chief Complaint: A.Fib  HPI: [name] is a 79 y.o. male with medical history significant of BPH, HLD, TIA in march this year (saw Dr. [name] in office in April).  Pt presents to ED with c/o fatigue and mild SOB.  Pt seen at [facility] today with c/o "flu symptoms".  Specifically he feels run down, had diarrhea earlier this week though now resolved.  Didn't have COVID vaccine, has had flu vaccine.  No h/o A.Fib, MI.  No palpitations (despite HR).  Mild DOE for past week.  At [facility] pt found to have HR 140.  Sent in to ED.   ED Course: HR persistently 140 in ED.  CTA neg for PE.  Does show mod-large B pleural effusions, no loculations.  ? Atypical PNA in lung apices.  COVID neg  WBC 8k  Procalcitonin pending.  Started on IV cardizem without much effect.  Vagal maneuver done by Dr. [name] which proved that pt was indeed in A.Flutter 2:1.  Given 5mg  IV metoprolol and now A.Flutter with rate of 110.   Review of Systems: As per HPI, otherwise all review of systems negative.  Past Medical History:  Diagnosis Date  . Allergy    seasonal  . BPH (benign prostatic hypertrophy)   . Cataract    bilateral-removed "back in 90's"  . Eczema   . Hyperlipidemia   . Peyronie's disease   . Retinal tear 2000's   bilateral-surgery to repair.  Marland Kitchen Urticaria     Past Surgical History:  Procedure Laterality Date  . COLONOSCOPY    . EYE SURGERY Bilateral 06/08   retinal detachment OS  . EYE SURGERY Bilateral 1994   cataract OU  . TONSILLECTOMY       reports that he quit smoking about 49 years ago. His smoking use included cigarettes. He quit after 20.00 years of use. He has never used smokeless tobacco. He reports previous alcohol use. He reports that he does  not use drugs.  Allergies  Allergen Reactions  . Wellbutrin [Bupropion Hcl] Rash    Family History  Problem Relation Age of Onset  . Cancer Father        back muscle   . Other Father        pituatary tumor  . Lung cancer Father   . Colon polyps Mother   . High blood pressure Brother   . Colon cancer Neg Hx   . Esophageal cancer Neg Hx   . Rectal cancer Neg Hx   . Stomach cancer Neg Hx      Prior to Admission medications   Medication Sig Start Date End Date Taking? Authorizing Provider  aspirin EC 81 MG tablet Take 81 mg by mouth daily.   Yes [provider]  cetirizine (ZYRTEC) 10 MG tablet Take 10 mg by mouth daily.   Yes [provider]  famotidine (PEPCID) 20 MG tablet Take 1 tablet (20 mg total) by mouth daily. 02/24/19  Yes Kozlow, Donnamarie Poag, MD  rosuvastatin (CRESTOR) 5 MG tablet Take 1 tablet (5 mg total) by mouth at bedtime. Patient taking differently: Take 5 mg by mouth daily.  02/10/20 02/09/21 Yes Garvin Fila, MD  zolpidem (AMBIEN) 10 MG tablet Take 1 tablet (10 mg total) by mouth at  bedtime as needed for sleep. 09/06/17 05/14/20  [name], MD    Physical Exam: Vitals:   09/03/20 2145 09/03/20 2200 09/03/20 2215 09/03/20 2245  BP: (!) 141/95 (!) 127/106 (!) 151/93 (!) 145/83  Pulse: (!) 139 (!) 143 (!) 139 (!) 139  Resp: (!) 21 (!) 23 16 16   Temp:      TempSrc:      SpO2: 99% 94% 90% 91%  Weight:      Height:        Constitutional: NAD, calm, comfortable Eyes: PERRL, lids and conjunctivae normal ENMT: Mucous membranes are moist. Posterior pharynx clear of any exudate or lesions.Normal dentition.  Neck: normal, supple, no masses, no thyromegaly Respiratory: Tachycardic, irregular Cardiovascular: Regular rate and rhythm, no murmurs / rubs / gallops. No extremity edema. 2+ pedal pulses. No carotid bruits.  Abdomen: no tenderness, no masses palpated. No hepatosplenomegaly. Bowel sounds positive.  Musculoskeletal: no clubbing / cyanosis. No  joint deformity upper and lower extremities. Good ROM, no contractures. Normal muscle tone.  Skin: no rashes, lesions, ulcers. No induration Neurologic: CN 2-12 grossly intact. Sensation intact, DTR normal. Strength 5/5 in all 4.  Psychiatric: Normal judgment and insight. Alert and oriented x 3. Normal mood.    Labs on Admission: I have personally reviewed following labs and imaging studies  CBC: Recent Labs  Lab 09/03/20 1738  WBC 8.4  HGB 16.1  HCT 48.8  MCV 90.2  PLT 242   Basic Metabolic Panel: Recent Labs  Lab 09/03/20 1738 09/03/20 1843  NA 137  --   K 5.9* 4.0  CL 104  --   CO2 22  --   GLUCOSE 98  --   BUN 13  --   CREATININE 1.03  --   CALCIUM 8.9  --   MG  --  2.4   GFR: Estimated Creatinine Clearance: 63.5 mL/min (by C-G formula based on SCr of 1.03 mg/dL). Liver Function Tests: Recent Labs  Lab 09/03/20 1738  AST 50*  ALT 22  ALKPHOS 93  BILITOT 1.6*  PROT 8.1  ALBUMIN 4.2   Recent Labs  Lab 09/03/20 1738  LIPASE 27   No results for input(s): AMMONIA in the last 168 hours. Coagulation Profile: No results for input(s): INR, PROTIME in the last 168 hours. Cardiac Enzymes: No results for input(s): CKTOTAL, CKMB, CKMBINDEX, TROPONINI in the last 168 hours. BNP (last 3 results) No results for input(s): PROBNP in the last 8760 hours. HbA1C: No results for input(s): HGBA1C in the last 72 hours. CBG: No results for input(s): GLUCAP in the last 168 hours. Lipid Profile: No results for input(s): CHOL, HDL, LDLCALC, TRIG, CHOLHDL, LDLDIRECT in the last 72 hours. Thyroid Function Tests: No results for input(s): TSH, T4TOTAL, FREET4, T3FREE, THYROIDAB in the last 72 hours. Anemia Panel: No results for input(s): VITAMINB12, FOLATE, FERRITIN, TIBC, IRON, RETICCTPCT in the last 72 hours. Urine analysis:    Component Value Date/Time   COLORURINE STRAW (A) 09/03/2020 1723   APPEARANCEUR CLEAR 09/03/2020 1723   LABSPEC 1.021 09/03/2020 1723   PHURINE  7.0 09/03/2020 1723   GLUCOSEU NEGATIVE 09/03/2020 1723   HGBUR SMALL (A) 09/03/2020 1723   BILIRUBINUR NEGATIVE 09/03/2020 1723   BILIRUBINUR NEG 11/22/2018 1110   KETONESUR NEGATIVE 09/03/2020 1723   PROTEINUR NEGATIVE 09/03/2020 1723   UROBILINOGEN 0.2 11/22/2018 1110   NITRITE NEGATIVE 09/03/2020 1723   LEUKOCYTESUR NEGATIVE 09/03/2020 1723    Radiological Exams on Admission: DG Chest 2 View  Result Date:  09/03/2020 CLINICAL DATA:  Short of breath EXAM: CHEST - 2 VIEW COMPARISON:  01/17/1999 21 FINDINGS: Small bilateral pleural effusions. No consolidation. Normal heart size. No pneumothorax. IMPRESSION: Small bilateral pleural effusions. Electronically Signed   By: Donavan Foil M.D.   On: 09/03/2020 17:29   CT Angio Chest PE W and/or Wo Contrast  Result Date: 09/03/2020 CLINICAL DATA:  Tachycardia, dyspnea EXAM: CT ANGIOGRAPHY CHEST WITH CONTRAST TECHNIQUE: Multidetector CT imaging of the chest was performed using the standard protocol during bolus administration of intravenous contrast. Multiplanar CT image reconstructions and MIPs were obtained to evaluate the vascular anatomy. CONTRAST:  157mL OMNIPAQUE IOHEXOL 350 MG/ML SOLN COMPARISON:  None. FINDINGS: Cardiovascular: There is adequate opacification of the pulmonary arterial tree. There is no intraluminal filling defect identified to suggest acute pulmonary embolism. The central pulmonary arteries are mildly enlarged in keeping with changes of pulmonary arterial hypertension. Minimal coronary artery calcification. Global cardiac size is within normal limits. The thoracic aorta is of normal caliber. Mild atherosclerotic calcification is seen scattered throughout the thoracic aorta. Mediastinum/Nodes: Thyroid unremarkable. No pathologic thoracic adenopathy. Esophagus unremarkable. Lungs/Pleura: Moderate to large bilateral dependently layering pleural effusions are present with compressive atelectasis of the dependent lungs bilaterally.  There is superimposed asymmetric biapical ground-glass pulmonary infiltrate, more severe within the left upper lobe, suspicious for acute infection the appropriate clinical setting. There is associated bronchial wall thickening asymmetrically involving the upper lobes bilaterally in keeping with airway inflammation. A a 13 mm intrapleural pulmonary nodule may represent a enlarged intrapulmonary lymph node. No central obstructing lesion. No pneumothorax. Upper Abdomen: No acute abnormality. Musculoskeletal: No lytic or blastic bone lesions. No acute bone abnormality. Review of the MIP images confirms the above findings. IMPRESSION: No pulmonary embolism. Moderate to large bilateral dependent, bland appearing pleural effusions with associated bilateral compressive atelectasis. Superimposed biapical pulmonary infiltrates and associated bronchial wall thickening suspicious for changes of atypical infection in the appropriate clinical setting. Aortic aneurysm NOS (ICD10-I71.9). Electronically Signed   By: Fidela Salisbury MD   On: 09/03/2020 19:38    EKG: Independently reviewed.  Assessment/Plan Principal Problem:   Atrial flutter with rapid ventricular response (HCC) Active Problems:   New onset of congestive heart failure (HCC)   Pleural effusion   New onset atrial flutter (HCC)   Atypical pneumonia    1. New onset A.Flutter with RVR - 1. A.Fib/flutter pathway 2. Rate control: 1. Cardizem gtt (currently at 15), will leave this on overnight at least before trying to convert to PO 2. Metoprolol 5mg  IV PRN 3. Metoprolol 25mg  PO Q6H scheduled to start at 0500 4. Ultimately will be converted to Metoprolol XL and Cardizem PO 3. Tele monitor 4. 2d echo 5. Check TSH (was normal earlier this year) 6. CHADS-VASC score = at least 4 (Age, h/o TIA earlier this year), could be as high as 6 if he has undiagnosed HTN (suspected based on recent office visit BPs including at neurologist office) and new onset  CHF. 1. Heparin gtt 2. Will need to convert to PO on discharge 7. See Dr. [name] note 2. Pleural effusion - 1. Question of new onset CHF? 2. Though BNP not elevated 3. Checking 2d echo 4. Need to get rate control of A.Flutter 5. Consider lasix for pleural effusion after rate control achieved 6. Holding off on tapping for the moment at Dr. Merrilee Jansky recommendation 3. ? Atypical PNA - 1. COVID neg, flu neg, WBC nl 2. Will put on empiric doxycycline for the moment 3. procalcitonin  pending  DVT prophylaxis: Heparin gtt Code Status: Full Family Communication: No family in room Disposition Plan: Home after A.Flutter rate controlled, possible new onset CHF worked up [employer] called: Dr. [name] at bedside Admission status: Admit to inpatient  Severity of Illness: The appropriate patient status for this patient is INPATIENT. Inpatient status is judged to be reasonable and necessary in order to provide the required intensity of service to ensure the patient's safety. The patient's presenting symptoms, physical exam findings, and initial radiographic and laboratory data in the context of their chronic comorbidities is felt to place them at high risk for further clinical deterioration. Furthermore, it is not anticipated that the patient will be medically stable for discharge from the hospital within 2 midnights of admission. The following factors support the patient status of inpatient.   IP status for suspicion of new onset CHF.  Also has new onset A.Flutter with RVR.   * I certify that at the point of admission it is my clinical judgment that the patient will require inpatient hospital care spanning beyond 2 midnights from the point of admission due to high intensity of service, high risk for further deterioration and high frequency of surveillance required.*    Philisha Weinel M. DO [facility]  How to contact the [facility] Attending or Consulting provider [place] or covering provider  during after hours [place], for this patient?  1. Check the care team in [facility] and look for a) attending/consulting [facility] provider listed and b) the [facility] team listed 2. Log into [email or web address]  Amion Physician Scheduling and messaging for groups and whole hospitals  On call and physician scheduling software for group practices, residents, hospitalists and other medical providers for call, clinic, rotation and shift schedules. OnCall [employer] is a hospital-wide system for scheduling doctors and paging doctors on call. EasyPlot is for scientific plotting and data analysis.  [email or web address]  and use [place]'s universal password to access. If you do not have the password, please contact the hospital operator.  3. Locate the [facility] provider you are looking for under [facility] and page to a number that you can be directly reached. 4. If you still have difficulty reaching the provider, please page the [facility] (Director on Call) for the Hospitalists listed on amion for assistance.  09/03/2020, 11:05 PM

## 2020-09-04 ENCOUNTER — Inpatient Hospital Stay (HOSPITAL_COMMUNITY): Payer: PPO

## 2020-09-04 DIAGNOSIS — J90 Pleural effusion, not elsewhere classified: Secondary | ICD-10-CM | POA: Diagnosis not present

## 2020-09-04 DIAGNOSIS — J189 Pneumonia, unspecified organism: Secondary | ICD-10-CM | POA: Diagnosis not present

## 2020-09-04 DIAGNOSIS — I4892 Unspecified atrial flutter: Principal | ICD-10-CM

## 2020-09-04 LAB — BASIC METABOLIC PANEL
Anion gap: 7 (ref 5–15)
BUN: 10 mg/dL (ref 8–23)
CO2: 27 mmol/L (ref 22–32)
Calcium: 8.6 mg/dL — ABNORMAL LOW (ref 8.9–10.3)
Chloride: 105 mmol/L (ref 98–111)
Creatinine, Ser: 0.87 mg/dL (ref 0.61–1.24)
GFR, Estimated: >60 mL/min (ref >60)
Glucose, Bld: 128 mg/dL — ABNORMAL HIGH (ref 70–99)
Potassium: 4.1 mmol/L (ref 3.5–5.1)
Sodium: 139 mmol/L (ref 135–145)

## 2020-09-04 LAB — PROTIME-INR
INR: 1.1 (ref 0.8–1.2)
Prothrombin Time: 13.6 seconds (ref 11.4–15.2)

## 2020-09-04 LAB — CBC
HCT: 41.2 % (ref 39.0–52.0)
Hemoglobin: 13.9 g/dL (ref 13.0–17.0)
MCH: 30.5 pg (ref 26.0–34.0)
MCHC: 33.7 g/dL (ref 30.0–36.0)
MCV: 90.4 fL (ref 80.0–100.0)
Platelets: 292 10*3/uL (ref 150–400)
RBC: 4.56 MIL/uL (ref 4.22–5.81)
RDW: 13.6 % (ref 11.5–15.5)
WBC: 6.5 10*3/uL (ref 4.0–10.5)
nRBC: 0 % (ref 0.0–0.2)

## 2020-09-04 LAB — HEPARIN LEVEL (UNFRACTIONATED)
Heparin Unfractionated: 0.2 IU/mL — ABNORMAL LOW (ref 0.30–0.70)
Heparin Unfractionated: 0.22 IU/mL — ABNORMAL LOW (ref 0.30–0.70)

## 2020-09-04 LAB — ECHOCARDIOGRAM COMPLETE
Height: 74 in
S' Lateral: 3.1 cm
Weight: 2853.63 oz

## 2020-09-04 LAB — APTT: aPTT: 45 seconds — ABNORMAL HIGH (ref 24–36)

## 2020-09-04 LAB — LACTATE DEHYDROGENASE: LDH: 115 U/L (ref 98–192)

## 2020-09-04 LAB — PROCALCITONIN: Procalcitonin: <0.1 ng/mL

## 2020-09-04 LAB — HIV ANTIBODY (ROUTINE TESTING W REFLEX): HIV Screen 4th Generation wRfx: NONREACTIVE

## 2020-09-04 LAB — STREP PNEUMONIAE URINARY ANTIGEN: Strep Pneumo Urinary Antigen: NEGATIVE

## 2020-09-04 LAB — TSH: TSH: 2.527 u[IU]/mL (ref 0.350–4.500)

## 2020-09-04 LAB — MRSA PCR SCREENING: MRSA by PCR: NEGATIVE

## 2020-09-04 MED ORDER — SODIUM CHLORIDE 0.9 % IV SOLN
2.0000 g | Freq: Three times a day (TID) | INTRAVENOUS | Status: DC
  Administered 2020-09-04 – 2020-09-07 (×9): 2 g via INTRAVENOUS
  Filled 2020-09-04 (×9): qty 2

## 2020-09-04 MED ORDER — DILTIAZEM HCL 60 MG PO TABS
60.0000 mg | ORAL_TABLET | Freq: Four times a day (QID) | ORAL | Status: DC
  Administered 2020-09-04 – 2020-09-05 (×4): 60 mg via ORAL
  Filled 2020-09-04 (×4): qty 1

## 2020-09-04 MED ORDER — CHLORHEXIDINE GLUCONATE CLOTH 2 % EX PADS
6.0000 | MEDICATED_PAD | Freq: Every day | CUTANEOUS | Status: DC
  Administered 2020-09-04 – 2020-09-06 (×3): 6 via TOPICAL

## 2020-09-04 MED ORDER — GUAIFENESIN ER 600 MG PO TB12
1200.0000 mg | ORAL_TABLET | Freq: Two times a day (BID) | ORAL | Status: DC
  Administered 2020-09-04 – 2020-09-07 (×7): 1200 mg via ORAL
  Filled 2020-09-04 (×7): qty 2

## 2020-09-04 MED ORDER — HEPARIN (PORCINE) 25000 UT/250ML-% IV SOLN: 1500.0000 [IU]/h | INTRAVENOUS | Status: DC

## 2020-09-04 MED ORDER — TRAZODONE HCL 50 MG PO TABS
50.0000 mg | ORAL_TABLET | Freq: Once | ORAL | Status: AC
  Administered 2020-09-04: 50 mg via ORAL
  Filled 2020-09-04: qty 1

## 2020-09-04 MED ORDER — ORAL CARE MOUTH RINSE
15.0000 mL | Freq: Two times a day (BID) | OROMUCOSAL | Status: DC
  Administered 2020-09-04 – 2020-09-07 (×6): 15 mL via OROMUCOSAL

## 2020-09-04 MED ORDER — SODIUM CHLORIDE 0.9 % IV SOLN
500.0000 mg | INTRAVENOUS | Status: DC
  Administered 2020-09-04: 500 mg via INTRAVENOUS
  Filled 2020-09-04: qty 500

## 2020-09-04 MED ORDER — BENZONATATE 100 MG PO CAPS
200.0000 mg | ORAL_CAPSULE | Freq: Two times a day (BID) | ORAL | Status: DC | PRN
  Administered 2020-09-04 – 2020-09-05 (×2): 200 mg via ORAL
  Filled 2020-09-04 (×2): qty 2

## 2020-09-04 NOTE — Progress Notes (Signed)
[place] for Heparin Indication: atrial fibrillation  Allergies  Allergen Reactions  . Wellbutrin [Bupropion Hcl] Rash    Patient Measurements: Height: 6\' 2"  (188 cm) Weight: 80.9 kg (178 lb 5.6 oz) IBW/kg (Calculated) : 82.2 Heparin Dosing Weight: 76 kg  Vital Signs: Temp: 98.1 F (36.7 C) (10/30 1923) Temp Source: Oral (10/30 1923) BP: 121/62 (10/30 2000) Pulse Rate: 63 (10/30 2000)  Labs: Recent Labs    09/03/20 1738 09/04/20 0022 09/04/20 0259 09/04/20 1004 09/04/20 2029  HGB 16.1  --  13.9  --   --   HCT 48.8  --  41.2  --   --   PLT 339  --  292  --   --   APTT  --  45*  --   --   --   LABPROT  --  13.6  --   --   --   INR  --  1.1  --   --   --   HEPARINUNFRC  --   --   --  0.20* 0.22*  CREATININE 1.03  --  0.87  --   --     Estimated Creatinine Clearance: 80.1 mL/min (by C-G formula based on SCr of 0.87 mg/dL).   Medications:  No PTA anticoagulants per med history  Assessment: 79 y/o M admitted with tachycardia. Pharmacy consulted to dose IV UFH for atrial fibrillation.   Today, 09/04/2020:  Heparin level = 0.22 (subtherapeutic) on 1300 units/hr  No bleeding or infusion issues noted   Goal of Therapy:  Heparin level 0.3-0.7 units/ml Monitor platelets by anticoagulation protocol: Yes   Plan:   Increase heparin infusion to 1500 units/hr  Recheck heparin level in 8 hr  Monitor for s/s bleeding or thrombosis  F/u plans for long-term anticoagulation  Leone Haven, PharmD 09/04/2020, 10:34 PM

## 2020-09-04 NOTE — Progress Notes (Signed)
Pharmacy Antibiotic Note  [name] is a 79 y.o. male admitted on 09/03/2020 with pneumonia.  Pharmacy has been consulted for cefepime dosing.  Plan: Cefepime 2g IV q8 per current renal function  Height: 6\' 2"  (188 cm) Weight: 80.9 kg (178 lb 5.6 oz) IBW/kg (Calculated) : 82.2  Temp (24hrs), Avg:97.6 F (36.4 C), Min:97.2 F (36.2 C), Max:98.5 F (36.9 C)  Recent Labs  Lab 09/03/20 1738 09/04/20 0259  WBC 8.4 6.5  CREATININE 1.03 0.87    Estimated Creatinine Clearance: 80.1 mL/min (by C-G formula based on SCr of 0.87 mg/dL).    Allergies  Allergen Reactions  . Wellbutrin [Bupropion Hcl] Rash    Thank you for allowing pharmacy to be a part of this patient's care.  Kara Mead 09/04/2020 9:44 AM

## 2020-09-04 NOTE — Plan of Care (Signed)

## 2020-09-04 NOTE — Consult Note (Signed)
Ref: [name], MD   Subjective:  Awake. HR down to 70-90/min. Atrial flutter continues.  Objective:  Vital Signs in the last 24 hours: Temp:  [97.2 F (36.2 C)-98.5 F (36.9 C)] 97.3 F (36.3 C) (10/30 0843) Pulse Rate:  [45-145] 70 (10/30 0800) Cardiac Rhythm: Atrial flutter (10/30 0807) Resp:  [15-24] 16 (10/30 0800) BP: (105-157)/(46-121) 110/55 (10/30 0800) SpO2:  [88 %-99 %] 89 % (10/30 0800) Weight:  [76 kg-80.9 kg] 80.9 kg (10/29 2339)  Physical Exam: BP Readings from Last 1 Encounters:  09/04/20 (!) 110/55     Wt Readings from Last 1 Encounters:  09/03/20 80.9 kg    Weight change:  Body mass index is 22.9 kg/m. HEENT: [place]/AT, Eyes-Blue, PERL, EOMI, Conjunctiva-Pink, Sclera-Non-icteric Neck: No JVD, No bruit, Trachea midline. Lungs:  Clear, Bilateral. Cardiac:  Irregular rhythm, normal S1 and S2, no S3. II/VI systolic murmur. Abdomen:  Soft, non-tender. BS present. Extremities:  No edema present. No cyanosis. No clubbing. CNS: AxOx3, Cranial nerves grossly intact, moves all 4 extremities.  Skin: Warm and dry.   Intake/Output from previous day: 10/29 0701 - 10/30 0700 In: 1112.9 [I.V.:112.9; IV Piggyback:1000] Out: 75 [Urine:75]    Lab Results: BMET    Component Value Date/Time   NA 139 09/04/2020 0259   NA 137 09/03/2020 1738   NA 143 12/18/2019 0959   NA 143 05/03/2018 1030   K 4.1 09/04/2020 0259   K 4.0 09/03/2020 1843   K 5.9 (H) 09/03/2020 1738   CL 105 09/04/2020 0259   CL 104 09/03/2020 1738   CL 107 12/18/2019 0959   CO2 27 09/04/2020 0259   CO2 22 09/03/2020 1738   CO2 21 12/18/2019 0959   GLUCOSE 128 (H) 09/04/2020 0259   GLUCOSE 98 09/03/2020 1738   GLUCOSE 92 12/18/2019 0959   BUN 10 09/04/2020 0259   BUN 13 09/03/2020 1738   BUN 15 12/18/2019 0959   BUN 16 05/03/2018 1030   CREATININE 0.87 09/04/2020 0259   CREATININE 1.03 09/03/2020 1738   CREATININE 0.79 12/18/2019 0959   CREATININE 0.83 11/19/2018 1022   CREATININE  0.86 05/03/2018 1030   CREATININE 0.84 01/30/2017 1019   CALCIUM 8.6 (L) 09/04/2020 0259   CALCIUM 8.9 09/03/2020 1738   CALCIUM 9.7 12/18/2019 0959   GFRNONAA >60 09/04/2020 0259   GFRNONAA >60 09/03/2020 1738   GFRNONAA 87 12/18/2019 0959   GFRNONAA 85 11/19/2018 1022   GFRNONAA 84 05/03/2018 1030   GFRNONAA 88 01/17/2016 1009   GFRAA 100 12/18/2019 0959   GFRAA 99 11/19/2018 1022   GFRAA 97 05/03/2018 1030   GFRAA >89 01/17/2016 1009   CBC    Component Value Date/Time   WBC 6.5 09/04/2020 0259   RBC 4.56 09/04/2020 0259   HGB 13.9 09/04/2020 0259   HGB 14.8 05/03/2018 1030   HCT 41.2 09/04/2020 0259   HCT 43.2 05/03/2018 1030   PLT 292 09/04/2020 0259   PLT 286 05/03/2018 1030   MCV 90.4 09/04/2020 0259   MCV 87 05/03/2018 1030   MCH 30.5 09/04/2020 0259   MCHC 33.7 09/04/2020 0259   RDW 13.6 09/04/2020 0259   RDW 14.2 05/03/2018 1030   LYMPHSABS 1,776 12/19/2019 1651   LYMPHSABS 1.4 05/03/2018 1030   MONOABS 583 01/30/2017 1019   EOSABS 81 12/19/2019 1651   EOSABS 0.2 05/03/2018 1030   BASOSABS 22 12/19/2019 1651   BASOSABS 0.0 05/03/2018 1030   HEPATIC Function Panel Recent Labs    12/18/19 7616  05/06/20 0929 09/03/20 1738  PROT 7.1 7.2 8.1   HEMOGLOBIN A1C No components found for: HGA1C,  MPG CARDIAC ENZYMES No results found for: CKTOTAL, CKMB, CKMBINDEX, TROPONINI BNP No results for input(s): PROBNP in the last 8760 hours. TSH Recent Labs    12/18/19 0959 09/04/20 0022  TSH 2.29 2.527   CHOLESTEROL Recent Labs    12/18/19 0959 05/06/20 0929  CHOL 199 185    Scheduled Meds: . Chlorhexidine Gluconate Cloth  6 each Topical Daily  . diltiazem  60 mg Oral Q6H  . famotidine  20 mg Oral Daily  . guaiFENesin  1,200 mg Oral BID  . loratadine  10 mg Oral Daily  . mouth rinse  15 mL Mouth Rinse BID  . metoprolol tartrate  25 mg Oral Q6H  . rosuvastatin  5 mg Oral Daily   Continuous Infusions: . azithromycin    . ceFEPime (MAXIPIME) IV     . heparin 1,050 Units/hr (09/04/20 0200)   PRN Meds:.acetaminophen, ondansetron (ZOFRAN) IV  Assessment/Plan: Artial flutter with variable AV block Atypical pneumonia Bilateral pleural effusion  Change IV diltiazem to PO Awaiting transthoracic echocardiogram Discussed TEE with cardioversion or cardioversion without TEE if on anticoagulant for 1 month.   LOS: 1 day   Time spent including chart review, lab review, examination, discussion with patient and nurse : 30 min   Dixie Dials  MD  09/04/2020, 10:22 AM

## 2020-09-04 NOTE — Progress Notes (Signed)
  Echocardiogram 2D Echocardiogram has been performed.  Jennette Dubin 09/04/2020, 10:39 AM

## 2020-09-04 NOTE — Progress Notes (Signed)
PROGRESS NOTE    [name]  JOI:[id number] DOB: 1941-02-09 DOA: 09/03/2020 PCP: [name], MD    Chief Complaint  Patient presents with  . Tachycardia  . Shortness of Breath    Brief Narrative:  HPI per Dr. [name] is a 79 y.o. male with medical history significant of BPH, HLD, TIA in march this year (saw Dr. [name] in office in April).  Pt presents to ED with c/o fatigue and mild SOB.  Pt seen at [facility] today with c/o "flu symptoms".  Specifically he feels run down, had diarrhea earlier this week though now resolved.  Didn't have COVID vaccine, has had flu vaccine.  No h/o A.Fib, MI.  No palpitations (despite HR).  Mild DOE for past week.  At [facility] pt found to have HR 140.  Sent in to ED.   ED Course: HR persistently 140 in ED.  CTA neg for PE.  Does show mod-large B pleural effusions, no loculations.  ? Atypical PNA in lung apices.  COVID neg  WBC 8k  Procalcitonin pending.  Started on IV cardizem without much effect.  Vagal maneuver done by Dr. [name] which proved that pt was indeed in A.Flutter 2:1.  Given 5mg  IV metoprolol and now A.Flutter with rate of 110.  Assessment & Plan:   Principal Problem:   Atrial flutter with rapid ventricular response (HCC) Active Problems:   New onset of congestive heart failure (HCC)   Pleural effusion   New onset atrial flutter ([place])   Atypical pneumonia  #1 new onset atrial flutter with RVR CHA2DS2VASC score at least 4 Questionable etiology.  Concern may be coming from pulmonary etiology as CT angiogram chest with findings concerning for atypical pneumonia versus inflammation in the biapical lobes as well as a moderate bilateral pleural effusions.  COVID-19 PCR negative.  Influenza PCR negative.  2D echo pending.  TSH within normal limits.  Patient currently on Cardizem drip at 15 mics with heart rates now less than 100 since around 6 AM this morning.  Continue oral metoprolol 25 mg p.o. every  6 hours.  If heart rate continues to remain controlled could possibly transition to oral Cardizem 60 mg p.o. every 6 hours this afternoon however will defer to cardiology.  Continue heparin for anticoagulation.  Per cardiology.  2.  Abnormal CT chest/atypical pneumonia CT angiogram chest with superimposed biapical pulmonary infiltrates and associated bronchial wall thickening suspicious for changes of atypical infection, moderate to large bilateral dependent, bland appearing pleural effusions with associated bilateral compressive atelectasis.  Patient did present with complaints of fatigue and shortness of breath for about 5 days preceded by symptoms of viral upper respiratory infection.  Check a urine Legionella antigen.  Check a urine pneumococcus antigen.  COVID-19 PCR negative.  Influenza A and influenza B PCR negative.  Discontinue doxycycline and broaden antibiotic coverage to cefepime and Flagyl.  Place on Mucinex.  Consulted pulmonary for further evaluation and management.  3.  Moderate to large bilateral pleural effusion Noted on CT angiogram chest.  Patient had presented fatigue and shortness of breath.  BNP within normal limits.  Consult pulmonary for further evaluation and management.  Follow.  4.  Hyperlipidemia Continue statin.   DVT prophylaxis: Heparin drip Code Status: Full Family Communication: Updated patient.  No family at bedside. Disposition:   Status is: Inpatient    Dispo: The patient is from: Home              Anticipated d/c  is to: Home              Anticipated d/c date is: 2 to 3 days.              Patient currently on IV antibiotics, Cardizem drip.  Not stable for discharge.       Consultants:   Cardiology: Dr. [name] 09/03/2020  PCCM pending  Procedures:   2D echo pending 09/04/2020  CT angiogram chest 09/03/2020  Chest x-ray 09/03/2020  Antimicrobials:    Doxycycline 09/03/2020>>>> 09/04/2020 IV cefepime 09/04/2020 IV azithromycin  09/04/2020  Subjective: Patient sitting up in bed getting 2D echo done.  Denies any chest pain.  Denies any palpitations.  Denies any significant shortness of breath today.  States he is starting to feel a little bit better.  Asking when he is going to be able to go home and whether he is able to get food.  On Cardizem drip.  Patient with sats of 88 to 90% on room air.  Objective: Vitals:   09/04/20 0400 09/04/20 0507 09/04/20 0600 09/04/20 0843  BP: (!) 132/51 (!) 157/76 (!) 108/50   Pulse: 77 (!) 132 72   Resp: 19  16   Temp: (!) 97.2 F (36.2 C)   (!) 97.3 F (36.3 C)  TempSrc: Oral   Oral  SpO2: (!) 89%  (!) 88%   Weight:      Height:        Intake/Output Summary (Last 24 hours) at 09/04/2020 0927 Last data filed at 09/04/2020 0302 Gross per 24 hour  Intake 1112.89 ml  Output 75 ml  Net 1037.89 ml   Filed Weights   09/03/20 1711 09/03/20 2339  Weight: 76 kg 80.9 kg    Examination:  General exam: Appears calm and comfortable  Respiratory system: Some scattered coarse breath sounds.  Some crackles noted in the bases.  No wheezing.  Normal respiratory effort.  Speaking in full sentences.   Cardiovascular system: Irregularly irregular.  No JVD, no murmurs rubs or gallops.  No lower extremity edema.  Gastrointestinal system: Abdomen is soft, nontender, nondistended, positive bowel sounds.  No rebound.  No guarding.  Central nervous system: Alert and oriented. No focal neurological deficits. Extremities: Symmetric 5 x 5 power. Skin: No rashes, lesions or ulcers Psychiatry: Judgement and insight appear normal. Mood & affect appropriate.     Data Reviewed: I have personally reviewed following labs and imaging studies  CBC: Recent Labs  Lab 09/03/20 1738 09/04/20 0259  WBC 8.4 6.5  HGB 16.1 13.9  HCT 48.8 41.2  MCV 90.2 90.4  PLT 339 242    Basic Metabolic Panel: Recent Labs  Lab 09/03/20 1738 09/03/20 1843 09/04/20 0259  NA 137  --  139  K 5.9* 4.0 4.1   CL 104  --  105  CO2 22  --  27  GLUCOSE 98  --  128*  BUN 13  --  10  CREATININE 1.03  --  0.87  CALCIUM 8.9  --  8.6*  MG  --  2.4  --     GFR: Estimated Creatinine Clearance: 80.1 mL/min (by C-G formula based on SCr of 0.87 mg/dL).  Liver Function Tests: Recent Labs  Lab 09/03/20 1738  AST 50*  ALT 22  ALKPHOS 93  BILITOT 1.6*  PROT 8.1  ALBUMIN 4.2    CBG: No results for input(s): GLUCAP in the last 168 hours.   Recent Results (from the past 240 hour(s))  Respiratory Panel by  RT PCR (Flu A&B, Covid) - Nasopharyngeal Swab     Status: None   Collection Time: 09/03/20  8:02 PM   Specimen: Nasopharyngeal Swab  Result Value Ref Range Status   SARS Coronavirus 2 by RT PCR NEGATIVE NEGATIVE Final    Comment: (NOTE) SARS-CoV-2 target nucleic acids are NOT DETECTED.  The SARS-CoV-2 RNA is generally detectable in upper respiratoy specimens during the acute phase of infection. The lowest concentration of SARS-CoV-2 viral copies this assay can detect is 131 copies/mL. A negative result does not preclude SARS-Cov-2 infection and should not be used as the sole basis for treatment or other patient management decisions. A negative result may occur with  improper specimen collection/handling, submission of specimen other than nasopharyngeal swab, presence of viral mutation(s) within the areas targeted by this assay, and inadequate number of viral copies (<131 copies/mL). A negative result must be combined with clinical observations, patient history, and epidemiological information. The expected result is Negative.  Fact Sheet for Patients:  PinkCheek.be  Fact Sheet for Healthcare Providers:  GravelBags.it  This test is no t yet approved or cleared by the [place] FDA and  has been authorized for detection and/or diagnosis of SARS-CoV-2 by FDA under an Emergency Use Authorization (EUA). This EUA will remain   in effect (meaning this test can be used) for the duration of the COVID-19 declaration under Section 564(b)(1) of the Act, 21 U.S.C. section 360bbb-3(b)(1), unless the authorization is terminated or revoked sooner.     Influenza A by PCR NEGATIVE NEGATIVE Final   Influenza B by PCR NEGATIVE NEGATIVE Final    Comment: (NOTE) The Xpert Xpress SARS-CoV-2/FLU/RSV assay is intended as an aid in  the diagnosis of influenza from Nasopharyngeal swab specimens and  should not be used as a sole basis for treatment. Nasal washings and  aspirates are unacceptable for Xpert Xpress SARS-CoV-2/FLU/RSV  testing.  Fact Sheet for Patients: PinkCheek.be  Fact Sheet for Healthcare Providers: GravelBags.it  This test is not yet approved or cleared by the [place] FDA and  has been authorized for detection and/or diagnosis of SARS-CoV-2 by  FDA under an Emergency Use Authorization (EUA). This EUA will remain  in effect (meaning this test can be used) for the duration of the  Covid-19 declaration under Section 564(b)(1) of the Act, 21  U.S.C. section 360bbb-3(b)(1), unless the authorization is  terminated or revoked. Performed at [facility], [place] [address], [place], [place] [address]          Radiology Studies: DG Chest 2 View  Result Date: 09/03/2020 CLINICAL DATA:  Short of breath EXAM: CHEST - 2 VIEW COMPARISON:  01/17/1999 21 FINDINGS: Small bilateral pleural effusions. No consolidation. Normal heart size. No pneumothorax. IMPRESSION: Small bilateral pleural effusions. Electronically Signed   By: Donavan Foil M.D.   On: 09/03/2020 17:29   CT Angio Chest PE W and/or Wo Contrast  Result Date: 09/03/2020 CLINICAL DATA:  Tachycardia, dyspnea EXAM: CT ANGIOGRAPHY CHEST WITH CONTRAST TECHNIQUE: Multidetector CT imaging of the chest was performed using the standard protocol during bolus administration of  intravenous contrast. Multiplanar CT image reconstructions and MIPs were obtained to evaluate the vascular anatomy. CONTRAST:  152mL OMNIPAQUE IOHEXOL 350 MG/ML SOLN COMPARISON:  None. FINDINGS: Cardiovascular: There is adequate opacification of the pulmonary arterial tree. There is no intraluminal filling defect identified to suggest acute pulmonary embolism. The central pulmonary arteries are mildly enlarged in keeping with changes of pulmonary arterial hypertension. Minimal coronary artery calcification.  Global cardiac size is within normal limits. The thoracic aorta is of normal caliber. Mild atherosclerotic calcification is seen scattered throughout the thoracic aorta. Mediastinum/Nodes: Thyroid unremarkable. No pathologic thoracic adenopathy. Esophagus unremarkable. Lungs/Pleura: Moderate to large bilateral dependently layering pleural effusions are present with compressive atelectasis of the dependent lungs bilaterally. There is superimposed asymmetric biapical ground-glass pulmonary infiltrate, more severe within the left upper lobe, suspicious for acute infection the appropriate clinical setting. There is associated bronchial wall thickening asymmetrically involving the upper lobes bilaterally in keeping with airway inflammation. A a 13 mm intrapleural pulmonary nodule may represent a enlarged intrapulmonary lymph node. No central obstructing lesion. No pneumothorax. Upper Abdomen: No acute abnormality. Musculoskeletal: No lytic or blastic bone lesions. No acute bone abnormality. Review of the MIP images confirms the above findings. IMPRESSION: No pulmonary embolism. Moderate to large bilateral dependent, bland appearing pleural effusions with associated bilateral compressive atelectasis. Superimposed biapical pulmonary infiltrates and associated bronchial wall thickening suspicious for changes of atypical infection in the appropriate clinical setting. Aortic aneurysm NOS (ICD10-I71.9). Electronically  Signed   By: Fidela Salisbury MD   On: 09/03/2020 19:38        Scheduled Meds: . Chlorhexidine Gluconate Cloth  6 each Topical Daily  . famotidine  20 mg Oral Daily  . loratadine  10 mg Oral Daily  . mouth rinse  15 mL Mouth Rinse BID  . metoprolol tartrate  25 mg Oral Q6H  . rosuvastatin  5 mg Oral Daily   Continuous Infusions: . azithromycin    . diltiazem (CARDIZEM) infusion 15 mg/hr (09/04/20 0506)  . heparin 1,050 Units/hr (09/04/20 0200)     LOS: 1 day    Time spent: 40 minutes    Irine Seal, MD [facility]   To contact the attending provider between 7A-7P or the covering provider during after hours 7P-7A, please log into the web site [email or web address] and access using universal [place] password for that web site. If you do not have the password, please call the hospital operator.  09/04/2020, 9:27 AM

## 2020-09-04 NOTE — Progress Notes (Signed)
[place] for Heparin Indication: atrial fibrillation  Allergies  Allergen Reactions  . Wellbutrin [Bupropion Hcl] Rash    Patient Measurements: Height: 6\' 2"  (188 cm) Weight: 80.9 kg (178 lb 5.6 oz) IBW/kg (Calculated) : 82.2 Heparin Dosing Weight: 76 kg  Vital Signs: Temp: 97.3 F (36.3 C) (10/30 0843) Temp Source: Oral (10/30 0843) BP: 110/55 (10/30 0800) Pulse Rate: 70 (10/30 0800)  Labs: Recent Labs    09/03/20 1738 09/04/20 0022 09/04/20 0259 09/04/20 1004  HGB 16.1  --  13.9  --   HCT 48.8  --  41.2  --   PLT 339  --  292  --   APTT  --  45*  --   --   LABPROT  --  13.6  --   --   INR  --  1.1  --   --   HEPARINUNFRC  --   --   --  0.20*  CREATININE 1.03  --  0.87  --     Estimated Creatinine Clearance: 80.1 mL/min (by C-G formula based on SCr of 0.87 mg/dL).   Medications:  No PTA anticoagulants per med history  Assessment: 79 y/o M admitted with tachycardia. Pharmacy consulted to dose IV UFH for atrial fibrillation.   Today, 09/04/2020:  CBC: stable WNL, some dilutional effects  Initial heparin level low on 1050 units/hr  No bleeding or infusion issues per RN  SCr stable and at baseline  Goal of Therapy:  Heparin level 0.3-0.7 units/ml Monitor platelets by anticoagulation protocol: Yes   Plan:   Increase heparin infusion to 1300 units/hr  Recheck heparin level in 8 hr  Monitor for s/s bleeding or thrombosis  F/u plans for long-term anticoagulation  Reuel Boom, PharmD, BCPS [phone] 09/04/2020, 11:36 AM

## 2020-09-04 NOTE — Consult Note (Signed)
NAME:  [name], MRN:  [id number], DOB:  07/03/1941, LOS: 1 ADMISSION DATE:  09/03/2020, CONSULTATION DATE: 09/04/2020 REFERRING MD: Irine Seal, MD, CHIEF COMPLAINT: Abnormal CT scan  Brief History   79 year old with history of BPH, hyperlipidemia, utricaria, TIA Complains of flulike symptoms, fatigue.  Found to be in atrial fibrillation with CTA showing bilateral effusions with groundglass opacities PCCM consulted for help with management.  He has tested negative for COVID-19 and the flu  Past Medical History    has a past medical history of Allergy, BPH (benign prostatic hypertrophy), Cataract, Eczema, Hyperlipidemia, Peyronie's disease, Retinal tear (2000's), and Urticaria.   Ex-smoker.  Quit in the 1970s.  Used to work at [employer].  Retired over 19 years ago No known exposures.  No mold, hot tub, Jacuzzi Has a dog.  No exposure to birds or feather pillows or comforters.  Significant Hospital Events   10/29-Admit  Consults:  PCCM  Procedures:    Significant Diagnostic Tests:  CTA 09/03/2020-no PE, moderate to large bilateral effusions with compressive atelectasis, biapical pulmonary infiltrates with bronchial thickening I have reviewed the images personally.  Echocardiogram 09/04/2020-   Micro Data:    Antimicrobials:  Doxycycline 10/29 Cefepime 10/30 > Azithromycin 10/30 >   Interim history/subjective:   No complaints states that breathing is improved.  Objective   Blood pressure (!) 110/55, pulse 70, temperature (!) 97.3 F (36.3 C), temperature source Oral, resp. rate 16, height 6\' 2"  (1.88 m), weight 80.9 kg, SpO2 (!) 89 %.        Intake/Output Summary (Last 24 hours) at 09/04/2020 1058 Last data filed at 09/04/2020 0302 Gross per 24 hour  Intake 1112.89 ml  Output 75 ml  Net 1037.89 ml   Filed Weights   09/03/20 1711 09/03/20 2339  Weight: 76 kg 80.9 kg    Examination: Blood pressure (!) 110/55, pulse 70, temperature (!)  97.3 F (36.3 C), temperature source Oral, resp. rate 16, height 6\' 2"  (1.88 m), weight 80.9 kg, SpO2 (!) 89 %. Gen:      No acute distress HEENT:  EOMI, sclera anicteric Neck:     No masses; no thyromegaly Lungs:    Clear to auscultation bilaterally; normal respiratory effort CV:         Regular rate and rhythm; no murmurs Abd:      + bowel sounds; soft, non-tender; no palpable masses, no distension Ext:    No edema; adequate peripheral perfusion Skin:      Warm and dry; no rash Neuro: alert and oriented x 3 Psych: normal mood and affect  Resolved Hospital Problem list     Assessment & Plan:  Abnormal chest CT CT with findings suspicious for atypical infection, pneumonia Agree with antibiotic coverage Check Legionella, pneumococcus No evidence of underlying interstitial lung disease He will need follow-up CT in 1 to 2 months as an outpatient for reevaluation Suspect bilateral effusions are secondary to heart failure, atrial fibrillation Follow echocardiogram.  Diuresis as tolerated. No indication for thoracentesis at present as he is on room air  Best practice:  Diet: PO Pain/Anxiety/Delirium protocol (if indicated): NA VAP protocol (if indicated): NA DVT prophylaxis: Heparin gtt GI prophylaxis: Pepcid Glucose control: Monitor Mobility: Bed Code Status: Full Family Communication: Per primary team. Patient updated Disposition: SDU  Labs   CBC: Recent Labs  Lab 09/03/20 1738 09/04/20 0259  WBC 8.4 6.5  HGB 16.1 13.9  HCT 48.8 41.2  MCV 90.2 90.4  PLT 339 292  Basic Metabolic Panel: Recent Labs  Lab 09/03/20 1738 09/03/20 1843 09/04/20 0259  NA 137  --  139  K 5.9* 4.0 4.1  CL 104  --  105  CO2 22  --  27  GLUCOSE 98  --  128*  BUN 13  --  10  CREATININE 1.03  --  0.87  CALCIUM 8.9  --  8.6*  MG  --  2.4  --    GFR: Estimated Creatinine Clearance: 80.1 mL/min (by C-G formula based on SCr of 0.87 mg/dL). Recent Labs  Lab 09/03/20 1738  09/04/20 0022 09/04/20 0259  PROCALCITON  --  <0.10  --   WBC 8.4  --  6.5    Liver Function Tests: Recent Labs  Lab 09/03/20 1738  AST 50*  ALT 22  ALKPHOS 93  BILITOT 1.6*  PROT 8.1  ALBUMIN 4.2   Recent Labs  Lab 09/03/20 1738  LIPASE 27   No results for input(s): AMMONIA in the last 168 hours.  ABG No results found for: PHART, PCO2ART, PO2ART, HCO3, TCO2, ACIDBASEDEF, O2SAT   Coagulation Profile: Recent Labs  Lab 09/04/20 0022  INR 1.1    Cardiac Enzymes: No results for input(s): CKTOTAL, CKMB, CKMBINDEX, TROPONINI in the last 168 hours.  HbA1C: No results found for: HGBA1C  CBG: No results for input(s): GLUCAP in the last 168 hours.  Review of Systems:   REVIEW OF SYSTEMS:   All negative; except for those that are bolded, which indicate positives.  Constitutional: weight loss, weight gain, night sweats, fevers, chills, fatigue, weakness.  HEENT: headaches, sore throat, sneezing, nasal congestion, post nasal drip, difficulty swallowing, tooth/dental problems, visual complaints, visual changes, ear aches. Neuro: difficulty with speech, weakness, numbness, ataxia. CV:  chest pain, orthopnea, PND, swelling in lower extremities, dizziness, palpitations, syncope.  Resp: cough, hemoptysis, dyspnea, wheezing. GI: heartburn, indigestion, abdominal pain, nausea, vomiting, diarrhea, constipation, change in bowel habits, loss of appetite, hematemesis, melena, hematochezia.  GU: dysuria, change in color of urine, urgency or frequency, flank pain, hematuria. MSK: joint pain or swelling, decreased range of motion. Psych: change in mood or affect, depression, anxiety, suicidal ideations, homicidal ideations. Skin: rash, itching, bruising.  Past Medical History  He,  has a past medical history of Allergy, BPH (benign prostatic hypertrophy), Cataract, Eczema, Hyperlipidemia, Peyronie's disease, Retinal tear (2000's), and Urticaria.   Surgical History    Past  Surgical History:  Procedure Laterality Date  . COLONOSCOPY    . EYE SURGERY Bilateral 06/08   retinal detachment OS  . EYE SURGERY Bilateral 1994   cataract OU  . TONSILLECTOMY       Social History   reports that he quit smoking about 49 years ago. His smoking use included cigarettes. He quit after 20.00 years of use. He has never used smokeless tobacco. He reports previous alcohol use. He reports that he does not use drugs.   Family History   His family history includes Cancer in his father; Colon polyps in his mother; High blood pressure in his brother; Lung cancer in his father; Other in his father. There is no history of Colon cancer, Esophageal cancer, Rectal cancer, or Stomach cancer.   Allergies Allergies  Allergen Reactions  . Wellbutrin [Bupropion Hcl] Rash     Home Medications  Prior to Admission medications   Medication Sig Start Date End Date Taking? Authorizing Provider  aspirin EC 81 MG tablet Take 81 mg by mouth daily.   Yes [provider]  cetirizine (  ZYRTEC) 10 MG tablet Take 10 mg by mouth daily.   Yes [provider]  famotidine (PEPCID) 20 MG tablet Take 1 tablet (20 mg total) by mouth daily. 02/24/19  Yes Kozlow, Donnamarie Poag, MD  rosuvastatin (CRESTOR) 5 MG tablet Take 1 tablet (5 mg total) by mouth at bedtime. Patient taking differently: Take 5 mg by mouth daily.  02/10/20 02/09/21 Yes Garvin Fila, MD  zolpidem (AMBIEN) 10 MG tablet Take 1 tablet (10 mg total) by mouth at bedtime as needed for sleep. 09/06/17 05/14/20  [name], MD     Signature:   Marshell Garfinkel MD [facility] Please see [email or web address] for pager details.  09/04/2020, 11:24 AM

## 2020-09-05 ENCOUNTER — Inpatient Hospital Stay (HOSPITAL_COMMUNITY): Payer: PPO

## 2020-09-05 DIAGNOSIS — I4892 Unspecified atrial flutter: Secondary | ICD-10-CM | POA: Diagnosis not present

## 2020-09-05 DIAGNOSIS — I340 Nonrheumatic mitral (valve) insufficiency: Secondary | ICD-10-CM

## 2020-09-05 DIAGNOSIS — K219 Gastro-esophageal reflux disease without esophagitis: Secondary | ICD-10-CM | POA: Diagnosis not present

## 2020-09-05 DIAGNOSIS — J189 Pneumonia, unspecified organism: Secondary | ICD-10-CM | POA: Diagnosis not present

## 2020-09-05 DIAGNOSIS — I071 Rheumatic tricuspid insufficiency: Secondary | ICD-10-CM

## 2020-09-05 DIAGNOSIS — J90 Pleural effusion, not elsewhere classified: Secondary | ICD-10-CM | POA: Diagnosis not present

## 2020-09-05 LAB — CBC WITH DIFFERENTIAL/PLATELET
Abs Immature Granulocytes: 0.01 10*3/uL (ref 0.00–0.07)
Basophils Absolute: 0.1 10*3/uL (ref 0.0–0.1)
Basophils Relative: 1 %
Eosinophils Absolute: 0.2 10*3/uL (ref 0.0–0.5)
Eosinophils Relative: 4 %
HCT: 41.4 % (ref 39.0–52.0)
Hemoglobin: 13.9 g/dL (ref 13.0–17.0)
Immature Granulocytes: 0 %
Lymphocytes Relative: 17 %
Lymphs Abs: 1 10*3/uL (ref 0.7–4.0)
MCH: 30.3 pg (ref 26.0–34.0)
MCHC: 33.6 g/dL (ref 30.0–36.0)
MCV: 90.4 fL (ref 80.0–100.0)
Monocytes Absolute: 0.7 10*3/uL (ref 0.1–1.0)
Monocytes Relative: 12 %
Neutro Abs: 4 10*3/uL (ref 1.7–7.7)
Neutrophils Relative %: 66 %
Platelets: 286 10*3/uL (ref 150–400)
RBC: 4.58 MIL/uL (ref 4.22–5.81)
RDW: 13.7 % (ref 11.5–15.5)
WBC: 6 10*3/uL (ref 4.0–10.5)
nRBC: 0 % (ref 0.0–0.2)

## 2020-09-05 LAB — BASIC METABOLIC PANEL
Anion gap: 11 (ref 5–15)
BUN: 15 mg/dL (ref 8–23)
CO2: 23 mmol/L (ref 22–32)
Calcium: 8.6 mg/dL — ABNORMAL LOW (ref 8.9–10.3)
Chloride: 106 mmol/L (ref 98–111)
Creatinine, Ser: 0.94 mg/dL (ref 0.61–1.24)
GFR, Estimated: >60 mL/min (ref >60)
Glucose, Bld: 109 mg/dL — ABNORMAL HIGH (ref 70–99)
Potassium: 3.6 mmol/L (ref 3.5–5.1)
Sodium: 140 mmol/L (ref 135–145)

## 2020-09-05 LAB — MAGNESIUM: Magnesium: 2.2 mg/dL (ref 1.7–2.4)

## 2020-09-05 LAB — BRAIN NATRIURETIC PEPTIDE: B Natriuretic Peptide: 69.3 pg/mL (ref 0.0–100.0)

## 2020-09-05 MED ORDER — PANTOPRAZOLE SODIUM 40 MG PO TBEC
40.0000 mg | DELAYED_RELEASE_TABLET | Freq: Every day | ORAL | Status: DC
  Administered 2020-09-05 – 2020-09-07 (×3): 40 mg via ORAL
  Filled 2020-09-05 (×3): qty 1

## 2020-09-05 MED ORDER — DILTIAZEM HCL 60 MG PO TABS
90.0000 mg | ORAL_TABLET | Freq: Three times a day (TID) | ORAL | Status: DC
  Administered 2020-09-05 – 2020-09-07 (×5): 90 mg via ORAL
  Filled 2020-09-05 (×5): qty 1

## 2020-09-05 MED ORDER — FUROSEMIDE 10 MG/ML IJ SOLN
20.0000 mg | Freq: Two times a day (BID) | INTRAMUSCULAR | Status: DC
  Administered 2020-09-05 – 2020-09-06 (×4): 20 mg via INTRAVENOUS
  Filled 2020-09-05 (×4): qty 2

## 2020-09-05 MED ORDER — ENOXAPARIN SODIUM 80 MG/0.8ML ~~LOC~~ SOLN
1.0000 mg/kg | Freq: Two times a day (BID) | SUBCUTANEOUS | Status: DC
  Administered 2020-09-05 – 2020-09-06 (×3): 80 mg via SUBCUTANEOUS
  Filled 2020-09-05 (×5): qty 0.8

## 2020-09-05 MED ORDER — METOPROLOL TARTRATE 25 MG PO TABS
50.0000 mg | ORAL_TABLET | Freq: Three times a day (TID) | ORAL | Status: DC
  Administered 2020-09-05 – 2020-09-07 (×5): 50 mg via ORAL
  Filled 2020-09-05 (×5): qty 2

## 2020-09-05 MED ORDER — DILTIAZEM HCL 30 MG PO TABS
90.0000 mg | ORAL_TABLET | Freq: Three times a day (TID) | ORAL | Status: DC
Start: 2020-09-05 — End: 2020-09-05

## 2020-09-05 MED ORDER — HYDROCODONE-HOMATROPINE 5-1.5 MG/5ML PO SYRP
5.0000 mL | ORAL_SOLUTION | Freq: Four times a day (QID) | ORAL | Status: DC | PRN
  Administered 2020-09-05: 5 mL via ORAL
  Filled 2020-09-05: qty 5

## 2020-09-05 MED ORDER — AZITHROMYCIN 250 MG PO TABS
500.0000 mg | ORAL_TABLET | Freq: Every day | ORAL | Status: DC
  Administered 2020-09-05 – 2020-09-07 (×3): 500 mg via ORAL
  Filled 2020-09-05 (×3): qty 2

## 2020-09-05 MED ORDER — POTASSIUM CHLORIDE CRYS ER 20 MEQ PO TBCR
20.0000 meq | EXTENDED_RELEASE_TABLET | Freq: Once | ORAL | Status: AC
  Administered 2020-09-05: 20 meq via ORAL
  Filled 2020-09-05: qty 1

## 2020-09-05 MED ORDER — DIGOXIN 0.25 MG/ML IJ SOLN
0.2500 mg | Freq: Every day | INTRAMUSCULAR | Status: AC
  Administered 2020-09-05 – 2020-09-06 (×2): 0.25 mg via INTRAVENOUS
  Filled 2020-09-05 (×2): qty 2

## 2020-09-05 NOTE — Progress Notes (Addendum)
[place] for Heparin>>Lovenox Indication: atrial fibrillation  Allergies  Allergen Reactions  . Wellbutrin [Bupropion Hcl] Rash    Patient Measurements: Height: 6\' 2"  (188 cm) Weight: 80.9 kg (178 lb 5.6 oz) IBW/kg (Calculated) : 82.2 Heparin Dosing Weight: 76 kg  Vital Signs: Temp: 97.6 F (36.4 C) (10/31 0000) Temp Source: Oral (10/31 0000) BP: 121/84 (10/31 0700) Pulse Rate: 66 (10/31 0700)  Labs: Recent Labs    09/03/20 1738 09/04/20 0022 09/04/20 0259 09/04/20 1004 09/04/20 2029  HGB 16.1  --  13.9  --   --   HCT 48.8  --  41.2  --   --   PLT 339  --  292  --   --   APTT  --  45*  --   --   --   LABPROT  --  13.6  --   --   --   INR  --  1.1  --   --   --   HEPARINUNFRC  --   --   --  0.20* 0.22*  CREATININE 1.03  --  0.87  --   --     Estimated Creatinine Clearance: 80.1 mL/min (by C-G formula based on SCr of 0.87 mg/dL).   Medications:  No PTA anticoagulants per med history  Assessment: 79 y/o M admitted with tachycardia. Pharmacy consulted to dose IV UFH for atrial fibrillation.   Today, 09/05/2020:  To transition from IV heparin to lovenox  No bleeding or infusion issues noted  No lab results yet today   Goal of Therapy:  Monitor platelets by anticoagulation protocol: Yes   Plan:   Dc heparin drip  lovenox 1 mg/kg = 80 mg sq q12h  CBC q72 hrs  Monitor for s/s bleeding or thrombosis  F/u plans for long-term anticoagulation  IV azithromycin to PO per protocol  Eudelia Bunch, Pharm.D 09/05/2020 8:13 AM

## 2020-09-05 NOTE — Consult Note (Signed)
Ref: [name], MD   Subjective:  Awake. Good diuresis with Lasix use. Echocardiogram with good LV function. Moderately dilated LA and RA. Moderate MR and severe TR. HR down to 60-80 with increasing B-blocker, diltiazem doses and addition of digoxin.   Objective:  Vital Signs in the last 24 hours: Temp:  [97.6 F (36.4 C)-98.5 F (36.9 C)] 98.5 F (36.9 C) (10/31 1027) Pulse Rate:  [44-66] 66 (10/31 0700) Cardiac Rhythm: Atrial flutter (10/31 1238) Resp:  [17-21] 19 (10/31 0700) BP: (109-144)/(50-84) 121/84 (10/31 0700) SpO2:  [85 %-95 %] 93 % (10/31 0700)  Physical Exam: BP Readings from Last 1 Encounters:  09/05/20 121/84     Wt Readings from Last 1 Encounters:  09/03/20 80.9 kg    Weight change:  Body mass index is 22.9 kg/m. HEENT: [place]/AT, Eyes-Blue, Conjunctiva-Pink, Sclera-Non-icteric Neck: No JVD, No bruit, Trachea midline. Lungs:  Clear, Bilateral. Cardiac:  Irregular rhythm, normal S1 and S2, no S3. II/VI systolic murmur. Abdomen:  Soft, non-tender. BS present. Extremities:  No edema present. No cyanosis. No clubbing. CNS: AxOx3, Cranial nerves grossly intact, moves all 4 extremities.  Skin: Warm and dry.   Intake/Output from previous day: 10/30 0701 - 10/31 0700 In: 880.1 [I.V.:436; IV Piggyback:444.1] Out: 800 [Urine:800]    Lab Results: BMET    Component Value Date/Time   NA 140 09/05/2020 0748   NA 139 09/04/2020 0259   NA 137 09/03/2020 1738   NA 143 05/03/2018 1030   K 3.6 09/05/2020 0748   K 4.1 09/04/2020 0259   K 4.0 09/03/2020 1843   CL 106 09/05/2020 0748   CL 105 09/04/2020 0259   CL 104 09/03/2020 1738   CO2 23 09/05/2020 0748   CO2 27 09/04/2020 0259   CO2 22 09/03/2020 1738   GLUCOSE 109 (H) 09/05/2020 0748   GLUCOSE 128 (H) 09/04/2020 0259   GLUCOSE 98 09/03/2020 1738   BUN 15 09/05/2020 0748   BUN 10 09/04/2020 0259   BUN 13 09/03/2020 1738   BUN 16 05/03/2018 1030   CREATININE 0.94 09/05/2020 0748   CREATININE 0.87  09/04/2020 0259   CREATININE 1.03 09/03/2020 1738   CREATININE 0.79 12/18/2019 0959   CREATININE 0.83 11/19/2018 1022   CREATININE 0.84 01/30/2017 1019   CALCIUM 8.6 (L) 09/05/2020 0748   CALCIUM 8.6 (L) 09/04/2020 0259   CALCIUM 8.9 09/03/2020 1738   GFRNONAA >60 09/05/2020 0748   GFRNONAA >60 09/04/2020 0259   GFRNONAA >60 09/03/2020 1738   GFRNONAA 87 12/18/2019 0959   GFRNONAA 85 11/19/2018 1022   GFRNONAA 88 01/17/2016 1009   GFRAA 100 12/18/2019 0959   GFRAA 99 11/19/2018 1022   GFRAA 97 05/03/2018 1030   GFRAA >89 01/17/2016 1009   CBC    Component Value Date/Time   WBC 6.0 09/05/2020 0748   RBC 4.58 09/05/2020 0748   HGB 13.9 09/05/2020 0748   HGB 14.8 05/03/2018 1030   HCT 41.4 09/05/2020 0748   HCT 43.2 05/03/2018 1030   PLT 286 09/05/2020 0748   PLT 286 05/03/2018 1030   MCV 90.4 09/05/2020 0748   MCV 87 05/03/2018 1030   MCH 30.3 09/05/2020 0748   MCHC 33.6 09/05/2020 0748   RDW 13.7 09/05/2020 0748   RDW 14.2 05/03/2018 1030   LYMPHSABS 1.0 09/05/2020 0748   LYMPHSABS 1.4 05/03/2018 1030   MONOABS 0.7 09/05/2020 0748   EOSABS 0.2 09/05/2020 0748   EOSABS 0.2 05/03/2018 1030   BASOSABS 0.1 09/05/2020 0748  BASOSABS 0.0 05/03/2018 1030   HEPATIC Function Panel Recent Labs    12/18/19 0959 05/06/20 0929 09/03/20 1738  PROT 7.1 7.2 8.1   HEMOGLOBIN A1C No components found for: HGA1C,  MPG CARDIAC ENZYMES No results found for: CKTOTAL, CKMB, CKMBINDEX, TROPONINI BNP No results for input(s): PROBNP in the last 8760 hours. TSH Recent Labs    12/18/19 0959 09/04/20 0022  TSH 2.29 2.527   CHOLESTEROL Recent Labs    12/18/19 0959 05/06/20 0929  CHOL 199 185    Scheduled Meds: . azithromycin  500 mg Oral Daily  . Chlorhexidine Gluconate Cloth  6 each Topical Daily  . digoxin  0.25 mg Intravenous Daily  . diltiazem  90 mg Oral Q8H  . enoxaparin (LOVENOX) injection  1 mg/kg Subcutaneous Q12H  . famotidine  20 mg Oral Daily  . furosemide   20 mg Intravenous Q12H  . guaiFENesin  1,200 mg Oral BID  . loratadine  10 mg Oral Daily  . mouth rinse  15 mL Mouth Rinse BID  . metoprolol tartrate  50 mg Oral Q8H  . pantoprazole  40 mg Oral Q0600  . rosuvastatin  5 mg Oral Daily   Continuous Infusions: . ceFEPime (MAXIPIME) IV Stopped (09/05/20 1324)   PRN Meds:.acetaminophen, benzonatate, HYDROcodone-homatropine, ondansetron (ZOFRAN) IV  Assessment/Plan: Atrial flutter with variable AV block Atypical pneumonia Bilateral pleural effusion  Continue medical treatment.   LOS: 2 days   Time spent including chart review, lab review, examination, discussion with patient and nurse : 30 min   Dixie Dials  MD  09/05/2020, 1:41 PM

## 2020-09-05 NOTE — Progress Notes (Signed)
PROGRESS NOTE    [name]  [name]:[id number] DOB: [birth year] DOA: 09/03/2020 PCP: [name], MD    Chief Complaint  Patient presents with  . Tachycardia  . Shortness of Breath    Brief Narrative:  HPI per Dr. [name] is a 79 y.o. male with medical history significant of BPH, HLD, TIA in march this year (saw Dr. [name] in office in April).  Pt presents to ED with c/o fatigue and mild SOB.  Pt seen at [facility] today with c/o "flu symptoms".  Specifically he feels run down, had diarrhea earlier this week though now resolved.  Didn't have COVID vaccine, has had flu vaccine.  No h/o A.Fib, MI.  No palpitations (despite HR).  Mild DOE for past week.  At [facility] pt found to have HR 140.  Sent in to ED.   ED Course: HR persistently 140 in ED.  CTA neg for PE.  Does show mod-large B pleural effusions, no loculations.  ? Atypical PNA in lung apices.  COVID neg  WBC 8k  Procalcitonin pending.  Started on IV cardizem without much effect.  Vagal maneuver done by Dr. [name] which proved that pt was indeed in A.Flutter 2:1.  Given 5mg  IV metoprolol and now A.Flutter with rate of 110.  Assessment & Plan:   Principal Problem:   Atrial flutter with rapid ventricular response (HCC) Active Problems:   Hyperlipidemia   GERD (gastroesophageal reflux disease)   New onset of congestive heart failure (HCC)   Pleural effusion   New onset atrial flutter (HCC)   Atypical pneumonia   Mild mitral valve regurgitation   Tricuspid valvular regurgitation  1 new onset atrial flutter with RVR CHA2DS2VASC score at least 4 Questionable etiology.  Concern may be coming from pulmonary etiology as CT angiogram chest with findings concerning for atypical pneumonia versus inflammation in the biapical lobes as well as a moderate bilateral pleural effusions.  COVID-19 PCR negative.  Influenza PCR negative.  2D echo  2D echo with EF 60 to 65%, no wall motion abnormalities,  moderately dilated left atrial size, severely dilated right atrial size, mild to moderate mitral valve regurgitation, severe tricuspid valvular regurgitation.  TSH within normal limits.  Patient noted this morning on telemetry to have heart rates ranging in the 120s to 130s. Patient was on a Cardizem drip and transition to oral Cardizem per cardiology (09/04/2020). Patient currently on Cardizem 60 mg every 6 hours and metoprolol 25 mg every 6 hours. Rate control per cardiology. Change IV heparin to full dose Lovenox for now in case patient undergoes possible TEE with cardioversion during this hospitalization. If patient undergoes cardioversion without TEE would likely need to be on anticoagulation for a month. Will defer to cardiology. Cardiology following and appreciate input and recommendations.  2.  Abnormal CT chest/atypical pneumonia CT angiogram chest with superimposed biapical pulmonary infiltrates and associated bronchial wall thickening suspicious for changes of atypical infection, moderate to large bilateral dependent, bland appearing pleural effusions with associated bilateral compressive atelectasis.  Patient did present with complaints of fatigue and shortness of breath for about 5 days preceded by symptoms of viral upper respiratory infection. Urine pneumococcus antigen negative. Urine Legionella antigen pending. COVID-19 PCR negative. Influenza A and B PCR negative. Doxycycline discontinued. Continue IV cefepime, azithromycin, Mucinex. Repeat chest x-ray this morning. Patient has been assessed by pulmonary who recommend follow-up CT in 1 to 2 months as an outpatient for reevaluation and treatment for possible atypical infection, pneumonia. Appreciate  PCCM input and recommendations.  3.  Moderate to large bilateral pleural effusion probably secondary to acute diastolic CHF. Noted on CT angiogram chest.  Patient had presented fatigue and shortness of breath.  BNP within normal limits. Patient  with hypoxia. Concern for volume overload/secondary to CHF from A. fib. 2D echo with EF 60 to 65%, no wall motion abnormalities, moderately dilated left atrial size, severely dilated right atrial size, mild to moderate mitral valve regurgitation, severe tricuspid valvular regurgitation. Will place on Lasix 20 mg IV every 12 hours and monitor urine output. Strict I's and O's. Daily weights. Patient seen by pulmonary who feel bilateral effusions are secondary to heart failure, A. fib and diuresis as tolerated. Per pulmonary no role for thoracentesis at this time.  4.  Hyperlipidemia Continue statin.  5. Mild to moderate mitral valvular regurgitation/severe tricuspid valvular regurgitation Per 2D echo. Management per cardiology.  6. GERD PPI.   DVT prophylaxis: Heparin drip>>> full dose Lovenox Code Status: Full Family Communication: Updated patient.  No family at bedside. Disposition:   Status is: Inpatient    Dispo: The patient is from: Home              Anticipated d/c is to: Home              Anticipated d/c date is: 2 to 3 days.              Patient currently on IV antibiotics, heart rate still irregular with heart rates in the 120s to 130s via telemetry this morning.  Not stable for discharge.       Consultants:   Cardiology: Dr. [name] 09/03/2020  PCCM: Dr. [name] 09/04/2020  Procedures:   2D echo 09/04/2020  CT angiogram chest 09/03/2020  Chest x-ray 09/03/2020  Antimicrobials:    Doxycycline 09/03/2020>>>> 09/04/2020 IV cefepime 09/04/2020 IV azithromycin 09/04/2020>>> oral azithromycin 09/05/2020  Subjective: Patient sitting up in bed eating yogurt. States overall feeling better than he did on admission. Denies any chest pain. Denies any palpitations. Denies any significant shortness of breath. Complaining of cough and inability to get sputum up.  Patient noted on telemetry this morning to have heart rates in the 120s to the 130s. Patient currently on room  air with sats ranging from 88 to 92%.  Objective: Vitals:   09/04/20 2300 09/05/20 0000 09/05/20 0600 09/05/20 0700  BP: (!) 142/57 (!) 137/50 (!) 144/53 121/84  Pulse: (!) 44 (!) 45 (!) 45 66  Resp: 18 20 20 19   Temp:  97.6 F (36.4 C)    TempSrc:  Oral    SpO2: 91% 91% 95% 93%  Weight:      Height:        Intake/Output Summary (Last 24 hours) at 09/05/2020 0929 Last data filed at 09/05/2020 0700 Gross per 24 hour  Intake 880.13 ml  Output 800 ml  Net 80.13 ml   Filed Weights   09/03/20 1711 09/03/20 2339  Weight: 76 kg 80.9 kg    Examination:  General exam: Appears calm and comfortable  Respiratory system: Crackles in the bases. Diffuse carotid coarse breath sounds. No wheezing noted. Speaking in full sentences. Normal respiratory effort. Cardiovascular system: Irregularly irregular. No JVD, no murmurs rubs or gallops. No lower extremity edema.  Gastrointestinal system: Abdomen is soft, nontender, nondistended, positive bowel sounds. No rebound. No guarding. Central nervous system: Alert and oriented. No focal neurological deficits. Extremities: Symmetric 5 x 5 power. Skin: No rashes, lesions or ulcers Psychiatry: Judgement and insight  appear normal. Mood & affect appropriate.     Data Reviewed: I have personally reviewed following labs and imaging studies  CBC: Recent Labs  Lab 09/03/20 1738 09/04/20 0259 09/05/20 0748  WBC 8.4 6.5 6.0  NEUTROABS  --   --  4.0  HGB 16.1 13.9 13.9  HCT 48.8 41.2 41.4  MCV 90.2 90.4 90.4  PLT 339 292 423    Basic Metabolic Panel: Recent Labs  Lab 09/03/20 1738 09/03/20 1843 09/04/20 0259 09/05/20 0748  NA 137  --  139 140  K 5.9* 4.0 4.1 3.6  CL 104  --  105 106  CO2 22  --  27 23  GLUCOSE 98  --  128* 109*  BUN 13  --  10 15  CREATININE 1.03  --  0.87 0.94  CALCIUM 8.9  --  8.6* 8.6*  MG  --  2.4  --  2.2    GFR: Estimated Creatinine Clearance: 74.1 mL/min (by C-G formula based on SCr of 0.94  mg/dL).  Liver Function Tests: Recent Labs  Lab 09/03/20 1738  AST 50*  ALT 22  ALKPHOS 93  BILITOT 1.6*  PROT 8.1  ALBUMIN 4.2    CBG: No results for input(s): GLUCAP in the last 168 hours.   Recent Results (from the past 240 hour(s))  Respiratory Panel by RT PCR (Flu A&B, Covid) - Nasopharyngeal Swab     Status: None   Collection Time: 09/03/20  8:02 PM   Specimen: Nasopharyngeal Swab  Result Value Ref Range Status   SARS Coronavirus 2 by RT PCR NEGATIVE NEGATIVE Final    Comment: (NOTE) SARS-CoV-2 target nucleic acids are NOT DETECTED.  The SARS-CoV-2 RNA is generally detectable in upper respiratoy specimens during the acute phase of infection. The lowest concentration of SARS-CoV-2 viral copies this assay can detect is 131 copies/mL. A negative result does not preclude SARS-Cov-2 infection and should not be used as the sole basis for treatment or other patient management decisions. A negative result may occur with  improper specimen collection/handling, submission of specimen other than nasopharyngeal swab, presence of viral mutation(s) within the areas targeted by this assay, and inadequate number of viral copies (<131 copies/mL). A negative result must be combined with clinical observations, patient history, and epidemiological information. The expected result is Negative.  Fact Sheet for Patients:  PinkCheek.be  Fact Sheet for Healthcare Providers:  GravelBags.it  This test is no t yet approved or cleared by the [place] FDA and  has been authorized for detection and/or diagnosis of SARS-CoV-2 by FDA under an Emergency Use Authorization (EUA). This EUA will remain  in effect (meaning this test can be used) for the duration of the COVID-19 declaration under Section 564(b)(1) of the Act, 21 U.S.C. section 360bbb-3(b)(1), unless the authorization is terminated or revoked sooner.     Influenza A by  PCR NEGATIVE NEGATIVE Final   Influenza B by PCR NEGATIVE NEGATIVE Final    Comment: (NOTE) The Xpert Xpress SARS-CoV-2/FLU/RSV assay is intended as an aid in  the diagnosis of influenza from Nasopharyngeal swab specimens and  should not be used as a sole basis for treatment. Nasal washings and  aspirates are unacceptable for Xpert Xpress SARS-CoV-2/FLU/RSV  testing.  Fact Sheet for Patients: PinkCheek.be  Fact Sheet for Healthcare Providers: GravelBags.it  This test is not yet approved or cleared by the [place] FDA and  has been authorized for detection and/or diagnosis of SARS-CoV-2 by  FDA under an Emergency Use  Authorization (EUA). This EUA will remain  in effect (meaning this test can be used) for the duration of the  Covid-19 declaration under Section 564(b)(1) of the Act, 21  U.S.C. section 360bbb-3(b)(1), unless the authorization is  terminated or revoked. Performed at [facility], [place] [address], [place], [place] [address]   MRSA PCR Screening     Status: None   Collection Time: 09/04/20 10:36 AM   Specimen: Nasal Mucosa; Nasopharyngeal  Result Value Ref Range Status   MRSA by PCR NEGATIVE NEGATIVE Final    Comment:        The GeneXpert MRSA Assay (FDA approved for NASAL specimens only), is one component of a comprehensive MRSA colonization surveillance program. It is not intended to diagnose MRSA infection nor to guide or monitor treatment for MRSA infections. Performed at [facility], [place] [address] [place], [place] [address]          Radiology Studies: DG Chest 2 View  Result Date: 09/03/2020 CLINICAL DATA:  Short of breath EXAM: CHEST - 2 VIEW COMPARISON:  01/17/1999 21 FINDINGS: Small bilateral pleural effusions. No consolidation. Normal heart size. No pneumothorax. IMPRESSION: Small bilateral pleural effusions. Electronically Signed   By: Donavan Foil M.D.   On: 09/03/2020 17:29   CT Angio Chest PE W and/or Wo Contrast  Result Date: 09/03/2020 CLINICAL DATA:  Tachycardia, dyspnea EXAM: CT ANGIOGRAPHY CHEST WITH CONTRAST TECHNIQUE: Multidetector CT imaging of the chest was performed using the standard protocol during bolus administration of intravenous contrast. Multiplanar CT image reconstructions and MIPs were obtained to evaluate the vascular anatomy. CONTRAST:  135mL OMNIPAQUE IOHEXOL 350 MG/ML SOLN COMPARISON:  None. FINDINGS: Cardiovascular: There is adequate opacification of the pulmonary arterial tree. There is no intraluminal filling defect identified to suggest acute pulmonary embolism. The central pulmonary arteries are mildly enlarged in keeping with changes of pulmonary arterial hypertension. Minimal coronary artery calcification. Global cardiac size is within normal limits. The thoracic aorta is of normal caliber. Mild atherosclerotic calcification is seen scattered throughout the thoracic aorta. Mediastinum/Nodes: Thyroid unremarkable. No pathologic thoracic adenopathy. Esophagus unremarkable. Lungs/Pleura: Moderate to large bilateral dependently layering pleural effusions are present with compressive atelectasis of the dependent lungs bilaterally. There is superimposed asymmetric biapical ground-glass pulmonary infiltrate, more severe within the left upper lobe, suspicious for acute infection the appropriate clinical setting. There is associated bronchial wall thickening asymmetrically involving the upper lobes bilaterally in keeping with airway inflammation. A a 13 mm intrapleural pulmonary nodule may represent a enlarged intrapulmonary lymph node. No central obstructing lesion. No pneumothorax. Upper Abdomen: No acute abnormality. Musculoskeletal: No lytic or blastic bone lesions. No acute bone abnormality. Review of the MIP images confirms the above findings. IMPRESSION: No pulmonary embolism. Moderate to large bilateral dependent,  bland appearing pleural effusions with associated bilateral compressive atelectasis. Superimposed biapical pulmonary infiltrates and associated bronchial wall thickening suspicious for changes of atypical infection in the appropriate clinical setting. Aortic aneurysm NOS (ICD10-I71.9). Electronically Signed   By: Fidela Salisbury MD   On: 09/03/2020 19:38   ECHOCARDIOGRAM COMPLETE  Result Date: 09/04/2020    ECHOCARDIOGRAM REPORT   Patient Name:   [name] Date of Exam: 09/04/2020 Medical Rec #:  [id number]       Height:       74.0 in Accession #:    [id number]      Weight:       178.4 lb Date of Birth:  05-15-1941  BSA:          2.070 m Patient Age:    85 years        BP:           108/50 mmHg Patient Gender: M               HR:           78 bpm. Exam Location:  Inpatient Procedure: 2D Echo Indications:     Atrial Flutter I48.92  History:         Patient has prior history of Echocardiogram examinations, most                  recent 01/17/2020. Risk Factors:Dyslipidemia.  Sonographer:     Mikki Santee RDCS (AE) Referring Phys:  [place] Diagnosing Phys: Dixie Dials MD IMPRESSIONS  1. Left ventricular ejection fraction, by estimation, is 60 to 65%. The left ventricle has normal function. The left ventricle has no regional wall motion abnormalities. Left ventricular diastolic parameters are indeterminate.  2. Right ventricular systolic function is normal. The right ventricular size is normal. There is normal pulmonary artery systolic pressure.  3. Left atrial size was moderately dilated.  4. Right atrial size was severely dilated.  5. The mitral valve is degenerative. Mild to moderate mitral valve regurgitation.  6. Tricuspid valve regurgitation is severe.  7. The aortic valve is bicuspid. There is mild calcification of the aortic valve. There is mild thickening of the aortic valve. Aortic valve regurgitation is not visualized. Mild aortic valve sclerosis is present, with no evidence of  aortic valve stenosis.  8. The inferior vena cava is dilated in size with <50% respiratory variability, suggesting right atrial pressure of 15 mmHg. FINDINGS  Left Ventricle: Left ventricular ejection fraction, by estimation, is 60 to 65%. The left ventricle has normal function. The left ventricle has no regional wall motion abnormalities. The left ventricular internal cavity size was normal in size. There is  no left ventricular hypertrophy. Left ventricular diastolic parameters are indeterminate. Right Ventricle: The right ventricular size is normal. No increase in right ventricular wall thickness. Right ventricular systolic function is normal. There is normal pulmonary artery systolic pressure. The tricuspid regurgitant velocity is 2.40 m/s, and  with an assumed right atrial pressure of 8 mmHg, the estimated right ventricular systolic pressure is 13.0 mmHg. Left Atrium: Left atrial size was moderately dilated. Right Atrium: Right atrial size was severely dilated. Pericardium: There is no evidence of pericardial effusion. Mitral Valve: The mitral valve is degenerative in appearance. There is mild thickening of the mitral valve leaflet(s). There is mild calcification of the mitral valve leaflet(s). Normal mobility of the mitral valve leaflets. Mild mitral annular calcification. Mild to moderate mitral valve regurgitation. Tricuspid Valve: The tricuspid valve is normal in structure. Tricuspid valve regurgitation is severe. Aortic Valve: The aortic valve is bicuspid. There is mild calcification of the aortic valve. There is mild thickening of the aortic valve. There is mild aortic valve annular calcification. Aortic valve regurgitation is not visualized. Mild aortic valve sclerosis is present, with no evidence of aortic valve stenosis. Pulmonic Valve: The pulmonic valve was normal in structure. Pulmonic valve regurgitation is not visualized. Aorta: The aortic root is normal in size and structure. There is minimal  (Grade I) atheroma plaque involving the aortic root, ascending aorta and descending aorta. Venous: The inferior vena cava is dilated in size with less than 50% respiratory variability, suggesting right atrial pressure of  15 mmHg. IAS/Shunts: The atrial septum is grossly normal.  LEFT VENTRICLE PLAX 2D LVIDd:         4.70 cm LVIDs:         3.10 cm LV PW:         1.00 cm LV IVS:        1.00 cm LVOT diam:     2.40 cm LV SV:         67 LV SV Index:   32 LVOT Area:     4.52 cm  RIGHT VENTRICLE RV S prime:     11.70 cm/s TAPSE (M-mode): 1.5 cm LEFT ATRIUM              Index       RIGHT ATRIUM           Index LA diam:        3.60 cm  1.74 cm/m  RA Area:     19.80 cm LA Vol (A2C):   111.0 ml 53.62 ml/m RA Volume:   65.90 ml  31.83 ml/m LA Vol (A4C):   59.0 ml  28.50 ml/m LA Biplane Vol: 82.5 ml  39.85 ml/m  AORTIC VALVE LVOT Vmax:   72.15 cm/s LVOT Vmean:  51.950 cm/s LVOT VTI:    0.149 m  AORTA Ao Root diam: 4.10 cm TRICUSPID VALVE TR Peak grad:   23.0 mmHg TR Vmax:        240.00 cm/s  SHUNTS Systemic VTI:  0.15 m Systemic Diam: 2.40 cm Dixie Dials MD Electronically signed by Dixie Dials MD Signature Date/Time: 09/04/2020/3:17:22 PM    Final         Scheduled Meds: . azithromycin  500 mg Oral Daily  . Chlorhexidine Gluconate Cloth  6 each Topical Daily  . diltiazem  60 mg Oral Q6H  . enoxaparin (LOVENOX) injection  1 mg/kg Subcutaneous Q12H  . famotidine  20 mg Oral Daily  . furosemide  20 mg Intravenous Q12H  . guaiFENesin  1,200 mg Oral BID  . loratadine  10 mg Oral Daily  . mouth rinse  15 mL Mouth Rinse BID  . metoprolol tartrate  25 mg Oral Q6H  . rosuvastatin  5 mg Oral Daily   Continuous Infusions: . ceFEPime (MAXIPIME) IV Stopped (09/05/20 0730)     LOS: 2 days    Time spent: 40 minutes    Irine Seal, MD [facility]   To contact the attending provider between 7A-7P or the covering provider during after hours 7P-7A, please log into the web site [email or web address]  and access using universal [place] password for that web site. If you do not have the password, please call the hospital operator.  09/05/2020, 9:29 AM

## 2020-09-06 ENCOUNTER — Encounter (HOSPITAL_COMMUNITY): Payer: Self-pay | Admitting: Internal Medicine

## 2020-09-06 ENCOUNTER — Other Ambulatory Visit: Payer: Self-pay

## 2020-09-06 ENCOUNTER — Inpatient Hospital Stay (HOSPITAL_COMMUNITY): Payer: PPO | Admitting: Anesthesiology

## 2020-09-06 ENCOUNTER — Inpatient Hospital Stay (HOSPITAL_COMMUNITY): Payer: PPO

## 2020-09-06 ENCOUNTER — Encounter (HOSPITAL_COMMUNITY)
Admission: EM | Disposition: A | Discharge status: Home / Self Care | Payer: Self-pay | Source: Home / Self Care | Attending: Internal Medicine

## 2020-09-06 DIAGNOSIS — J189 Pneumonia, unspecified organism: Secondary | ICD-10-CM | POA: Diagnosis not present

## 2020-09-06 DIAGNOSIS — I4892 Unspecified atrial flutter: Secondary | ICD-10-CM | POA: Diagnosis not present

## 2020-09-06 DIAGNOSIS — K219 Gastro-esophageal reflux disease without esophagitis: Secondary | ICD-10-CM | POA: Diagnosis not present

## 2020-09-06 DIAGNOSIS — J90 Pleural effusion, not elsewhere classified: Secondary | ICD-10-CM | POA: Diagnosis not present

## 2020-09-06 HISTORY — PX: BUBBLE STUDY: SHX6837

## 2020-09-06 HISTORY — PX: TEE WITHOUT CARDIOVERSION: SHX5443

## 2020-09-06 HISTORY — PX: CARDIOVERSION: SHX1299

## 2020-09-06 LAB — CBC WITH DIFFERENTIAL/PLATELET
Abs Immature Granulocytes: 0.02 10*3/uL (ref 0.00–0.07)
Basophils Absolute: 0.1 10*3/uL (ref 0.0–0.1)
Basophils Relative: 1 %
Eosinophils Absolute: 0.3 10*3/uL (ref 0.0–0.5)
Eosinophils Relative: 4 %
HCT: 41.9 % (ref 39.0–52.0)
Hemoglobin: 13.7 g/dL (ref 13.0–17.0)
Immature Granulocytes: 0 %
Lymphocytes Relative: 17 %
Lymphs Abs: 1.1 10*3/uL (ref 0.7–4.0)
MCH: 29.2 pg (ref 26.0–34.0)
MCHC: 32.7 g/dL (ref 30.0–36.0)
MCV: 89.3 fL (ref 80.0–100.0)
Monocytes Absolute: 1 10*3/uL (ref 0.1–1.0)
Monocytes Relative: 15 %
Neutro Abs: 4.2 10*3/uL (ref 1.7–7.7)
Neutrophils Relative %: 63 %
Platelets: 273 10*3/uL (ref 150–400)
RBC: 4.69 MIL/uL (ref 4.22–5.81)
RDW: 13.4 % (ref 11.5–15.5)
WBC: 6.6 10*3/uL (ref 4.0–10.5)
nRBC: 0 % (ref 0.0–0.2)

## 2020-09-06 LAB — BASIC METABOLIC PANEL
Anion gap: 9 (ref 5–15)
BUN: 17 mg/dL (ref 8–23)
CO2: 25 mmol/L (ref 22–32)
Calcium: 8.6 mg/dL — ABNORMAL LOW (ref 8.9–10.3)
Chloride: 104 mmol/L (ref 98–111)
Creatinine, Ser: 1.06 mg/dL (ref 0.61–1.24)
GFR, Estimated: >60 mL/min (ref >60)
Glucose, Bld: 104 mg/dL — ABNORMAL HIGH (ref 70–99)
Potassium: 3.9 mmol/L (ref 3.5–5.1)
Sodium: 138 mmol/L (ref 135–145)

## 2020-09-06 LAB — ECHO TEE
AV Peak grad: 4.9 mmHg
Ao pk vel: 1.11 m/s

## 2020-09-06 LAB — MAGNESIUM: Magnesium: 2.3 mg/dL (ref 1.7–2.4)

## 2020-09-06 SURGERY — ECHOCARDIOGRAM, TRANSESOPHAGEAL
Anesthesia: Monitor Anesthesia Care

## 2020-09-06 SURGERY — CARDIOVERSION
Anesthesia: Choice

## 2020-09-06 MED ORDER — METOPROLOL TARTRATE 5 MG/5ML IV SOLN
INTRAVENOUS | Status: AC
  Filled 2020-09-06: qty 5

## 2020-09-06 MED ORDER — PHENYLEPHRINE 40 MCG/ML (10ML) SYRINGE FOR IV PUSH (FOR BLOOD PRESSURE SUPPORT)
PREFILLED_SYRINGE | INTRAVENOUS | Status: DC | PRN
  Administered 2020-09-06 (×4): 120 ug via INTRAVENOUS

## 2020-09-06 MED ORDER — ONDANSETRON HCL 4 MG/2ML IJ SOLN
INTRAMUSCULAR | Status: DC | PRN
  Administered 2020-09-06: 4 mg via INTRAVENOUS

## 2020-09-06 MED ORDER — DEXAMETHASONE SODIUM PHOSPHATE 10 MG/ML IJ SOLN
INTRAMUSCULAR | Status: DC | PRN
  Administered 2020-09-06: 4 mg via INTRAVENOUS

## 2020-09-06 MED ORDER — METOPROLOL TARTRATE 5 MG/5ML IV SOLN
5.0000 mg | Freq: Once | INTRAVENOUS | Status: AC
  Administered 2020-09-06: 5 mg via INTRAVENOUS

## 2020-09-06 MED ORDER — SODIUM CHLORIDE 0.9 % IV SOLN: INTRAVENOUS | Status: DC | PRN

## 2020-09-06 MED ORDER — PROPOFOL 10 MG/ML IV BOLUS
INTRAVENOUS | Status: DC | PRN
  Administered 2020-09-06 (×2): 50 mg via INTRAVENOUS

## 2020-09-06 MED ORDER — LIDOCAINE 2% (20 MG/ML) 5 ML SYRINGE
INTRAMUSCULAR | Status: DC | PRN
  Administered 2020-09-06: 60 mg via INTRAVENOUS

## 2020-09-06 MED ORDER — EPHEDRINE SULFATE-NACL 50-0.9 MG/10ML-% IV SOSY
PREFILLED_SYRINGE | INTRAVENOUS | Status: DC | PRN
  Administered 2020-09-06: 10 mg via INTRAVENOUS

## 2020-09-06 MED ORDER — PROPOFOL 500 MG/50ML IV EMUL
INTRAVENOUS | Status: DC | PRN
  Administered 2020-09-06: 100 ug/kg/min via INTRAVENOUS

## 2020-09-06 NOTE — Transfer of Care (Signed)
Immediate Anesthesia Transfer of Care Note  Patient: [name]  Procedure(s) Performed: TRANSESOPHAGEAL ECHOCARDIOGRAM (TEE) (N/A ) CARDIOVERSION (N/A ) BUBBLE STUDY  Patient Location: Endoscopy Unit  Anesthesia Type:MAC  Level of Consciousness: awake, alert  and oriented  Airway & Oxygen Therapy: Patient Spontanous Breathing and Patient connected to nasal cannula oxygen  Post-op Assessment: Report given to RN and Post -op Vital signs reviewed and stable  Post vital signs: Reviewed and stable  Last Vitals:  Vitals Value Taken Time  BP 105/43 09/06/20 1401  Temp    Pulse 79 09/06/20 1403  Resp 18 09/06/20 1403  SpO2 100 % 09/06/20 1403  Vitals shown include unvalidated device data.  Last Pain:  Vitals:   09/06/20 1251  TempSrc: Oral  PainSc: 0-No pain      Patients Stated Pain Goal: 0 (53/96/72 [id number])  Complications: No complications documented.

## 2020-09-06 NOTE — Progress Notes (Signed)
Pt arrive back from [facility] via [facility] at this time. Pt is alert and stable. Medications for this time is on hold. Pt is on RA, noted. Call bell in reach. Pt do continue to have a dry non productive cough.

## 2020-09-06 NOTE — Progress Notes (Signed)
PROGRESS NOTE    [name]  ZJI:[id number] DOB: 1941-03-16 DOA: 09/03/2020 PCP: [name], MD    Chief Complaint  Patient presents with  . Tachycardia  . Shortness of Breath    Brief Narrative:  HPI per Dr. [name] is a 79 y.o. male with medical history significant of BPH, HLD, TIA in march this year (saw Dr. [name] in office in April).  Pt presents to ED with c/o fatigue and mild SOB.  Pt seen at [facility] today with c/o "flu symptoms".  Specifically he feels run down, had diarrhea earlier this week though now resolved.  Didn't have COVID vaccine, has had flu vaccine.  No h/o A.Fib, MI.  No palpitations (despite HR).  Mild DOE for past week.  At [facility] pt found to have HR 140.  Sent in to ED.   ED Course: HR persistently 140 in ED.  CTA neg for PE.  Does show mod-large B pleural effusions, no loculations.  ? Atypical PNA in lung apices.  COVID neg  WBC 8k  Procalcitonin pending.  Started on IV cardizem without much effect.  Vagal maneuver done by Dr. [name] which proved that pt was indeed in A.Flutter 2:1.  Given 5mg  IV metoprolol and now A.Flutter with rate of 110.  Assessment & Plan:   Principal Problem:   Atrial flutter with rapid ventricular response (HCC) Active Problems:   Hyperlipidemia   GERD (gastroesophageal reflux disease)   New onset of congestive heart failure (HCC)   Pleural effusion   New onset atrial flutter (HCC)   Atypical pneumonia   Mild mitral valve regurgitation   Tricuspid valvular regurgitation  1 new onset atrial flutter with RVR CHA2DS2VASC score at least 4 Questionable etiology.  Concern may be coming from pulmonary etiology as CT angiogram chest with findings concerning for atypical pneumonia versus inflammation in the biapical lobes as well as a moderate bilateral pleural effusions.  COVID-19 PCR negative.  Influenza PCR negative.  2D echo  2D echo with EF 60 to 65%, no wall motion abnormalities,  moderately dilated left atrial size, severely dilated right atrial size, mild to moderate mitral valve regurgitation, severe tricuspid valvular regurgitation.  TSH within normal limits.  Patient noted this morning on telemetry to have heart rates ranging in the 140s overnight.   Patient was on a Cardizem drip and transitioned to oral Cardizem per cardiology (09/04/2020). Patient currently on Cardizem 90 mg every 8 hours and metoprolol 50 mg every 8 hours, and digoxin.  Heart rate noted into the 140s overnight and patient still in A. fib.  IV heparin was changed to full dose Lovenox for now.  Patient for TEE cardioversion today per cardiology recommendations/notes.  Further management per cardiology.   2.  Abnormal CT chest/atypical pneumonia CT angiogram chest with superimposed biapical pulmonary infiltrates and associated bronchial wall thickening suspicious for changes of atypical infection, moderate to large bilateral dependent, bland appearing pleural effusions with associated bilateral compressive atelectasis.  Patient did present with complaints of fatigue and shortness of breath for about 5 days preceded by symptoms of viral upper respiratory infection. Urine pneumococcus antigen negative. Urine Legionella antigen pending. COVID-19 PCR negative. Influenza A and B PCR negative. Doxycycline discontinued. Continue IV cefepime, azithromycin, Mucinex. Repeat chest x-ray(09/05/2020) with worsening lung aeration compared to prior study, increased airspace lung opacities noted in the upper and lower lungs consistent with multifocal pneumonia.  Mild increase in bilateral pleural effusions.  Patient has been assessed by pulmonary who  recommend follow-up CT in 1 to 2 months as an outpatient for reevaluation and treatment for possible atypical infection, pneumonia. Appreciate PCCM input and recommendations.  3.  Moderate to large bilateral pleural effusion probably secondary to acute diastolic CHF. Noted on CT  angiogram chest.  Patient had presented fatigue and shortness of breath.  BNP within normal limits. Patient with hypoxia. Concern for volume overload/secondary to CHF from A. fib. 2D echo with EF 60 to 65%, no wall motion abnormalities, moderately dilated left atrial size, severely dilated right atrial size, mild to moderate mitral valve regurgitation, severe tricuspid valvular regurgitation.  Patient started on Lasix 20 mg IV every 12 hours with a urine output of 3.975 L over the past 24 hours.  Patient is -2.837 L during his hospitalization.  Weight not recorded today.  Strict I's and O's.  Daily weights.  Patient seen by pulmonary who feel bilateral effusions are secondary to heart failure, A. fib and diuresis as tolerated. Per pulmonary no role for thoracentesis at this time.  We will repeat chest x-ray tomorrow.  4.  Hyperlipidemia Continue statin.  5. Mild to moderate mitral valvular regurgitation/severe tricuspid valvular regurgitation Per 2D echo. Management per cardiology.  6. GERD Continue PPI.  DC Pepcid.   DVT prophylaxis: Heparin drip>>> full dose Lovenox Code Status: Full Family Communication: Updated patient.  No family at bedside. Disposition:   Status is: Inpatient    Dispo: The patient is from: Home              Anticipated d/c is to: Home              Anticipated d/c date is: 1 to 2 days.               Patient currently on IV antibiotics, heart rate still irregular with heart rates in the 120s to 140s on telemetry.  Patient for TEE cardioversion today.  Not stable for discharge.       Consultants:   Cardiology: Dr. [name] 09/03/2020  PCCM: Dr. [name] 09/04/2020  Procedures:   2D echo 09/04/2020  CT angiogram chest 09/03/2020  Chest x-ray 09/03/2020  Antimicrobials:    Doxycycline 09/03/2020>>>> 09/04/2020 IV cefepime 09/04/2020 IV azithromycin 09/04/2020>>> oral azithromycin 09/05/2020  Subjective: Patient sitting up in bed urinating.  Denies  any chest pain.  Denies any significant shortness of breath.  Denies any palpitations.  States some improvement with cough.  Patient noted overnight on telemetry to have heart rates in the 140s.  Heart rate this morning the 110s.  Patient denies any bleeding.  States some improvement with cough.  Patient noted to have significant urine output on diuretics.  Patient with sats ranging from 88 to 92% on room air.  Patient in agreement to sign consent for TEE cardioversion today.  Objective: Vitals:   09/06/20 0000 09/06/20 0200 09/06/20 0400 09/06/20 0622  BP: 110/74 (!) 149/71 (!) 117/59 126/76  Pulse: 90 91 (!) 104 (!) 118  Resp: 18 20 (!) 21   Temp:    98.9 F (37.2 C)  TempSrc:    Oral  SpO2: 92% 92% 92%   Weight:      Height:        Intake/Output Summary (Last 24 hours) at 09/06/2020 0747 Last data filed at 09/06/2020 0500 Gross per 24 hour  Intake 120 ml  Output 3975 ml  Net -3855 ml   Filed Weights   09/03/20 1711 09/03/20 2339  Weight: 76 kg 80.9 kg  Examination:  General exam: Appears calm and comfortable  Respiratory system: Scattered crackles.  Some scattered coarse breath sounds.  No wheezing.  Speaking in full sentences.  Normal respiratory effort.   Cardiovascular system: Irregularly irregular.  No JVD, no murmurs rubs or gallops.  No lower extremity edema.   Gastrointestinal system: Abdomen is soft, nontender, nondistended, positive bowel sounds.  No rebound.  No guarding.  Central nervous system: Alert and oriented. No focal neurological deficits. Extremities: Symmetric 5 x 5 power. Skin: No rashes, lesions or ulcers Psychiatry: Judgement and insight appear normal. Mood & affect appropriate.     Data Reviewed: I have personally reviewed following labs and imaging studies  CBC: Recent Labs  Lab 09/03/20 1738 09/04/20 0259 09/05/20 0748 09/06/20 0323  WBC 8.4 6.5 6.0 6.6  NEUTROABS  --   --  4.0 4.2  HGB 16.1 13.9 13.9 13.7  HCT 48.8 41.2 41.4 41.9   MCV 90.2 90.4 90.4 89.3  PLT 339 292 286 536    Basic Metabolic Panel: Recent Labs  Lab 09/03/20 1738 09/03/20 1843 09/04/20 0259 09/05/20 0748 09/06/20 0323  NA 137  --  139 140 138  K 5.9* 4.0 4.1 3.6 3.9  CL 104  --  105 106 104  CO2 22  --  27 23 25   GLUCOSE 98  --  128* 109* 104*  BUN 13  --  10 15 17   CREATININE 1.03  --  0.87 0.94 1.06  CALCIUM 8.9  --  8.6* 8.6* 8.6*  MG  --  2.4  --  2.2 2.3    GFR: Estimated Creatinine Clearance: 65.7 mL/min (by C-G formula based on SCr of 1.06 mg/dL).  Liver Function Tests: Recent Labs  Lab 09/03/20 1738  AST 50*  ALT 22  ALKPHOS 93  BILITOT 1.6*  PROT 8.1  ALBUMIN 4.2    CBG: No results for input(s): GLUCAP in the last 168 hours.   Recent Results (from the past 240 hour(s))  Respiratory Panel by RT PCR (Flu A&B, Covid) - Nasopharyngeal Swab     Status: None   Collection Time: 09/03/20  8:02 PM   Specimen: Nasopharyngeal Swab  Result Value Ref Range Status   SARS Coronavirus 2 by RT PCR NEGATIVE NEGATIVE Final    Comment: (NOTE) SARS-CoV-2 target nucleic acids are NOT DETECTED.  The SARS-CoV-2 RNA is generally detectable in upper respiratoy specimens during the acute phase of infection. The lowest concentration of SARS-CoV-2 viral copies this assay can detect is 131 copies/mL. A negative result does not preclude SARS-Cov-2 infection and should not be used as the sole basis for treatment or other patient management decisions. A negative result may occur with  improper specimen collection/handling, submission of specimen other than nasopharyngeal swab, presence of viral mutation(s) within the areas targeted by this assay, and inadequate number of viral copies (<131 copies/mL). A negative result must be combined with clinical observations, patient history, and epidemiological information. The expected result is Negative.  Fact Sheet for Patients:  PinkCheek.be  Fact Sheet for  Healthcare Providers:  GravelBags.it  This test is no t yet approved or cleared by the [place] FDA and  has been authorized for detection and/or diagnosis of SARS-CoV-2 by FDA under an Emergency Use Authorization (EUA). This EUA will remain  in effect (meaning this test can be used) for the duration of the COVID-19 declaration under Section 564(b)(1) of the Act, 21 U.S.C. section 360bbb-3(b)(1), unless the authorization is terminated or revoked sooner.  Influenza A by PCR NEGATIVE NEGATIVE Final   Influenza B by PCR NEGATIVE NEGATIVE Final    Comment: (NOTE) The Xpert Xpress SARS-CoV-2/FLU/RSV assay is intended as an aid in  the diagnosis of influenza from Nasopharyngeal swab specimens and  should not be used as a sole basis for treatment. Nasal washings and  aspirates are unacceptable for Xpert Xpress SARS-CoV-2/FLU/RSV  testing.  Fact Sheet for Patients: PinkCheek.be  Fact Sheet for Healthcare Providers: GravelBags.it  This test is not yet approved or cleared by the [place] FDA and  has been authorized for detection and/or diagnosis of SARS-CoV-2 by  FDA under an Emergency Use Authorization (EUA). This EUA will remain  in effect (meaning this test can be used) for the duration of the  Covid-19 declaration under Section 564(b)(1) of the Act, 21  U.S.C. section 360bbb-3(b)(1), unless the authorization is  terminated or revoked. Performed at [facility], [place] [address], [place], [place] [address]   MRSA PCR Screening     Status: None   Collection Time: 09/04/20 10:36 AM   Specimen: Nasal Mucosa; Nasopharyngeal  Result Value Ref Range Status   MRSA by PCR NEGATIVE NEGATIVE Final    Comment:        The GeneXpert MRSA Assay (FDA approved for NASAL specimens only), is one component of a comprehensive MRSA colonization surveillance program. It is  not intended to diagnose MRSA infection nor to guide or monitor treatment for MRSA infections. Performed at [facility], [place] [address], [place], [place] [address]          Radiology Studies: DG CHEST PORT 1 VIEW  Result Date: 09/05/2020 CLINICAL DATA:  Short of breath. EXAM: PORTABLE CHEST 1 VIEW COMPARISON:  09/03/2020 FINDINGS: Airspace opacities in the upper and lower lungs bilaterally have increased from the prior exam. Bilateral pleural effusions likely increased in size. Lungs remain hyperexpanded. No pneumothorax. IMPRESSION: 1. Worsening lung aeration compared to the prior study. Increased airspace lung opacities noted in the upper and lower lungs consistent with multifocal pneumonia. Mild increase in bilateral pleural effusions. Electronically Signed   By: Lajean Manes M.D.   On: 09/05/2020 10:27   ECHOCARDIOGRAM COMPLETE  Result Date: 09/04/2020    ECHOCARDIOGRAM REPORT   Patient Name:   DELLAS GUARD Date of Exam: 09/04/2020 Medical Rec #:  [id number]       Height:       74.0 in Accession #:    [id number]      Weight:       178.4 lb Date of Birth:  Oct 24, 1941        BSA:          2.070 m Patient Age:    52 years        BP:           108/50 mmHg Patient Gender: M               HR:           78 bpm. Exam Location:  Inpatient Procedure: 2D Echo Indications:     Atrial Flutter I48.92  History:         Patient has prior history of Echocardiogram examinations, most                  recent 01/17/2020. Risk Factors:Dyslipidemia.  Sonographer:     Mikki Santee RDCS (AE) Referring Phys:  [place] Diagnosing Phys: Dixie Dials MD IMPRESSIONS  1. Left  ventricular ejection fraction, by estimation, is 60 to 65%. The left ventricle has normal function. The left ventricle has no regional wall motion abnormalities. Left ventricular diastolic parameters are indeterminate.  2. Right ventricular systolic function is normal. The right ventricular size is normal.  There is normal pulmonary artery systolic pressure.  3. Left atrial size was moderately dilated.  4. Right atrial size was severely dilated.  5. The mitral valve is degenerative. Mild to moderate mitral valve regurgitation.  6. Tricuspid valve regurgitation is severe.  7. The aortic valve is bicuspid. There is mild calcification of the aortic valve. There is mild thickening of the aortic valve. Aortic valve regurgitation is not visualized. Mild aortic valve sclerosis is present, with no evidence of aortic valve stenosis.  8. The inferior vena cava is dilated in size with <50% respiratory variability, suggesting right atrial pressure of 15 mmHg. FINDINGS  Left Ventricle: Left ventricular ejection fraction, by estimation, is 60 to 65%. The left ventricle has normal function. The left ventricle has no regional wall motion abnormalities. The left ventricular internal cavity size was normal in size. There is  no left ventricular hypertrophy. Left ventricular diastolic parameters are indeterminate. Right Ventricle: The right ventricular size is normal. No increase in right ventricular wall thickness. Right ventricular systolic function is normal. There is normal pulmonary artery systolic pressure. The tricuspid regurgitant velocity is 2.40 m/s, and  with an assumed right atrial pressure of 8 mmHg, the estimated right ventricular systolic pressure is 95.1 mmHg. Left Atrium: Left atrial size was moderately dilated. Right Atrium: Right atrial size was severely dilated. Pericardium: There is no evidence of pericardial effusion. Mitral Valve: The mitral valve is degenerative in appearance. There is mild thickening of the mitral valve leaflet(s). There is mild calcification of the mitral valve leaflet(s). Normal mobility of the mitral valve leaflets. Mild mitral annular calcification. Mild to moderate mitral valve regurgitation. Tricuspid Valve: The tricuspid valve is normal in structure. Tricuspid valve regurgitation is  severe. Aortic Valve: The aortic valve is bicuspid. There is mild calcification of the aortic valve. There is mild thickening of the aortic valve. There is mild aortic valve annular calcification. Aortic valve regurgitation is not visualized. Mild aortic valve sclerosis is present, with no evidence of aortic valve stenosis. Pulmonic Valve: The pulmonic valve was normal in structure. Pulmonic valve regurgitation is not visualized. Aorta: The aortic root is normal in size and structure. There is minimal (Grade I) atheroma plaque involving the aortic root, ascending aorta and descending aorta. Venous: The inferior vena cava is dilated in size with less than 50% respiratory variability, suggesting right atrial pressure of 15 mmHg. IAS/Shunts: The atrial septum is grossly normal.  LEFT VENTRICLE PLAX 2D LVIDd:         4.70 cm LVIDs:         3.10 cm LV PW:         1.00 cm LV IVS:        1.00 cm LVOT diam:     2.40 cm LV SV:         67 LV SV Index:   32 LVOT Area:     4.52 cm  RIGHT VENTRICLE RV S prime:     11.70 cm/s TAPSE (M-mode): 1.5 cm LEFT ATRIUM              Index       RIGHT ATRIUM           Index LA diam:  3.60 cm  1.74 cm/m  RA Area:     19.80 cm LA Vol (A2C):   111.0 ml 53.62 ml/m RA Volume:   65.90 ml  31.83 ml/m LA Vol (A4C):   59.0 ml  28.50 ml/m LA Biplane Vol: 82.5 ml  39.85 ml/m  AORTIC VALVE LVOT Vmax:   72.15 cm/s LVOT Vmean:  51.950 cm/s LVOT VTI:    0.149 m  AORTA Ao Root diam: 4.10 cm TRICUSPID VALVE TR Peak grad:   23.0 mmHg TR Vmax:        240.00 cm/s  SHUNTS Systemic VTI:  0.15 m Systemic Diam: 2.40 cm Dixie Dials MD Electronically signed by Dixie Dials MD Signature Date/Time: 09/04/2020/3:17:22 PM    Final         Scheduled Meds: . azithromycin  500 mg Oral Daily  . Chlorhexidine Gluconate Cloth  6 each Topical Daily  . digoxin  0.25 mg Intravenous Daily  . diltiazem  90 mg Oral Q8H  . enoxaparin (LOVENOX) injection  1 mg/kg Subcutaneous Q12H  . famotidine  20 mg Oral  Daily  . furosemide  20 mg Intravenous Q12H  . guaiFENesin  1,200 mg Oral BID  . loratadine  10 mg Oral Daily  . mouth rinse  15 mL Mouth Rinse BID  . metoprolol tartrate  50 mg Oral Q8H  . pantoprazole  40 mg Oral Q0600  . rosuvastatin  5 mg Oral Daily   Continuous Infusions: . ceFEPime (MAXIPIME) IV Stopped (09/06/20 0517)     LOS: 3 days    Time spent: 40 minutes    Irine Seal, MD [facility]   To contact the attending provider between 7A-7P or the covering provider during after hours 7P-7A, please log into the web site [email or web address] and access using universal [place] password for that web site. If you do not have the password, please call the hospital operator.  09/06/2020, 7:47 AM

## 2020-09-06 NOTE — Anesthesia Procedure Notes (Signed)
Procedure Name: MAC Date/Time: 09/06/2020 1:54 PM Performed by: Teressa Lower., CRNA Pre-anesthesia Checklist: Patient identified, Emergency Drugs available, Suction available, Patient being monitored and Timeout performed Patient Re-evaluated:Patient Re-evaluated prior to induction Oxygen Delivery Method: Nasal cannula and Circle system utilized Preoxygenation: Pre-oxygenation with 100% oxygen Induction Type: IV induction Dental Injury: Teeth and Oropharynx as per pre-operative assessment

## 2020-09-06 NOTE — CV Procedure (Addendum)
TEE preliminary report Probe placed with anesthesia assistance. Patient tolerated the procedure. Good LV systolic function. Mild multi-jet MR and TR. Minimal AI and PI. No LAA clot. No PFO. Moderate atheroma of ascending, arch and descending aorta. Propofol 300 mg. Used for sedation. Successful cardioversion post procedure.  Dixie Dials, MD 09/06/2020 at 1:50 PM.

## 2020-09-06 NOTE — CV Procedure (Signed)
PRE-OP DIAGNOSIS:  Atrial flutter.  POST-OP DIAGNOSIS:  Atrial flutter converted to sinus rhythm.  OPERATOR:  Dixie Dials, MD.     ANESTHESIA:  Deep with 300 mg. Of Propofol..  COMPLICATIONS:  None.   OPERATIVE TERM:  DC cardioversion.  The nature of the procedure, risks and alternatives were discussed with the patient who gave informed consent.  OPERATIVE TECHNIQUE:  The patient was sedated with Propofol.  When the patient was no longer responsive to quiet voice, DC cardioversion was performed with 120 J biphasically and synchronously.  Post shock patient in sinus rhythm. The patient was then monitored until fully alert and left the procedure area in stable condition.  IMPRESSION:  Successful DC cardioversion.

## 2020-09-06 NOTE — Anesthesia Preprocedure Evaluation (Addendum)
Anesthesia Evaluation  Patient identified by MRN, date of birth, ID band Patient awake    Reviewed: Allergy & Precautions, NPO status , Patient's Chart, lab work & pertinent test results  Airway Mallampati: II  TM Distance: >3 FB Neck ROM: Full    Dental   Pulmonary former smoker,    Pulmonary exam normal breath sounds clear to auscultation       Cardiovascular Exercise Tolerance: Good + dysrhythmias Atrial Fibrillation  Rhythm:Regular Rate:Abnormal   1. Left ventricular ejection fraction, by estimation, is 60 to 65%. The  left ventricle has normal function. The left ventricle has no regional  wall motion abnormalities. Left ventricular diastolic parameters are  indeterminate.  2. Right ventricular systolic function is normal. The right ventricular  size is normal. There is normal pulmonary artery systolic pressure.  3. Left atrial size was moderately dilated.  4. Right atrial size was severely dilated.  5. The mitral valve is degenerative. Mild to moderate mitral valve  regurgitation.  6. Tricuspid valve regurgitation is severe.  7. The aortic valve is bicuspid. There is mild calcification of the  aortic valve. There is mild thickening of the aortic valve. Aortic valve  regurgitation is not visualized. Mild aortic valve sclerosis is present,  with no evidence of aortic valve  stenosis.  8. The inferior vena cava is dilated in size with <50% respiratory  variability, suggesting right atrial pressure of 15 mmHg.    Neuro/Psych Anxiety  Neuromuscular disease (lumbar radiculopathy)    GI/Hepatic Neg liver ROS, GERD  ,  Endo/Other  negative endocrine ROS  Renal/GU negative Renal ROS  negative genitourinary   Musculoskeletal negative musculoskeletal ROS (+)   Abdominal   Peds  Hematology negative hematology ROS (+)   Anesthesia Other Findings   Reproductive/Obstetrics negative OB ROS                             Anesthesia Physical Anesthesia Plan  ASA: III  Anesthesia Plan: MAC   Post-op Pain Management:    Induction:   PONV Risk Score and Plan: 1 and Propofol infusion, TIVA and Treatment may vary due to age or medical condition  Airway Management Planned: Natural Airway and Nasal Cannula  Additional Equipment:   Intra-op Plan:   Post-operative Plan:   Informed Consent: I have reviewed the patients History and Physical, chart, labs and discussed the procedure including the risks, benefits and alternatives for the proposed anesthesia with the patient or authorized representative who has indicated his/her understanding and acceptance.     Dental advisory given  Plan Discussed with: CRNA and Anesthesiologist  Anesthesia Plan Comments:         Anesthesia Quick Evaluation

## 2020-09-06 NOTE — Progress Notes (Signed)
  Echocardiogram 2D Echocardiogram has been performed.  [name] 09/06/2020, 2:01 PM

## 2020-09-06 NOTE — Progress Notes (Signed)
Patient has consent at bedside. He stated "would sign when doctor talked with him this morning"

## 2020-09-06 NOTE — Progress Notes (Signed)
[place] [facility]    Patient stable without acute events.  Admit with flu-like symptoms.  PCCM following for abnormal CT chest, bilateral pleural effusions.  Suspect CT findings are in setting of AFwRVR, pulmonary edema but must rule out atypical infectious process / viral pneumonitis.  Note ECHO with good LV function, dilated LA/RA, moderate MR & severe TR. Strep antigen negative.    Pulmonary recommendations:  -continue abx for 7 days  -follow cultures to maturity  -lasix for negative balance as renal function / BP permit  -Cardiology following  -follow intermittent CXR  -if effusions persistent despite diuresis, may need sampling  -wean O2 off for sats >88% -assess ambulatory O2 needs prior to discharge  -will need follow up CT in 1-2 months as outpatient for re-evaluation     Discharge follow up arranged.   Follow-up Information    Lauraine Rinne, NP Follow up on 09/20/2020.   Specialty: Pulmonary Disease Why: Appt at [facility].  Please arrive at 1:45 for check in.  Contact information: [address] [place] [address] [phone]                Noe Gens, MSN, NP-C, AGACNP-BC [facility] 09/06/2020, 12:05 PM   Please see [email or web address] for pager details.

## 2020-09-06 NOTE — Progress Notes (Signed)
Pt en route to [facility] via [facility], noted. Pt is alert and oriented times 4 and stable, transported on 2 L of oxygen via nasal cannula. No  c/o pain or discomfort.

## 2020-09-07 ENCOUNTER — Telehealth: Payer: Self-pay | Admitting: Internal Medicine

## 2020-09-07 ENCOUNTER — Inpatient Hospital Stay (HOSPITAL_COMMUNITY): Payer: PPO

## 2020-09-07 DIAGNOSIS — E785 Hyperlipidemia, unspecified: Secondary | ICD-10-CM

## 2020-09-07 DIAGNOSIS — K219 Gastro-esophageal reflux disease without esophagitis: Secondary | ICD-10-CM | POA: Diagnosis not present

## 2020-09-07 DIAGNOSIS — J189 Pneumonia, unspecified organism: Secondary | ICD-10-CM | POA: Diagnosis not present

## 2020-09-07 DIAGNOSIS — I4892 Unspecified atrial flutter: Secondary | ICD-10-CM | POA: Diagnosis not present

## 2020-09-07 LAB — EXPECTORATED SPUTUM ASSESSMENT W GRAM STAIN, RFLX TO RESP C

## 2020-09-07 LAB — RENAL FUNCTION PANEL
Albumin: 3.2 g/dL — ABNORMAL LOW (ref 3.5–5.0)
Anion gap: 9 (ref 5–15)
BUN: 24 mg/dL — ABNORMAL HIGH (ref 8–23)
CO2: 24 mmol/L (ref 22–32)
Calcium: 8.8 mg/dL — ABNORMAL LOW (ref 8.9–10.3)
Chloride: 104 mmol/L (ref 98–111)
Creatinine, Ser: 0.91 mg/dL (ref 0.61–1.24)
GFR, Estimated: >60 mL/min (ref >60)
Glucose, Bld: 136 mg/dL — ABNORMAL HIGH (ref 70–99)
Phosphorus: 3.2 mg/dL (ref 2.5–4.6)
Potassium: 4.2 mmol/L (ref 3.5–5.1)
Sodium: 137 mmol/L (ref 135–145)

## 2020-09-07 LAB — HEMOGLOBIN A1C
Hgb A1c MFr Bld: 5.1 % (ref 4.8–5.6)
Mean Plasma Glucose: 99.67 mg/dL

## 2020-09-07 LAB — CBC WITH DIFFERENTIAL/PLATELET
Abs Immature Granulocytes: 0.02 10*3/uL (ref 0.00–0.07)
Basophils Absolute: 0 10*3/uL (ref 0.0–0.1)
Basophils Relative: 0 %
Eosinophils Absolute: 0 10*3/uL (ref 0.0–0.5)
Eosinophils Relative: 0 %
HCT: 42.3 % (ref 39.0–52.0)
Hemoglobin: 14 g/dL (ref 13.0–17.0)
Immature Granulocytes: 0 %
Lymphocytes Relative: 12 %
Lymphs Abs: 0.6 10*3/uL — ABNORMAL LOW (ref 0.7–4.0)
MCH: 29.6 pg (ref 26.0–34.0)
MCHC: 33.1 g/dL (ref 30.0–36.0)
MCV: 89.4 fL (ref 80.0–100.0)
Monocytes Absolute: 0.3 10*3/uL (ref 0.1–1.0)
Monocytes Relative: 5 %
Neutro Abs: 4.3 10*3/uL (ref 1.7–7.7)
Neutrophils Relative %: 83 %
Platelets: 300 10*3/uL (ref 150–400)
RBC: 4.73 MIL/uL (ref 4.22–5.81)
RDW: 13 % (ref 11.5–15.5)
WBC: 5.3 10*3/uL (ref 4.0–10.5)
nRBC: 0 % (ref 0.0–0.2)

## 2020-09-07 LAB — LEGIONELLA PNEUMOPHILA SEROGP 1 UR AG: L. pneumophila Serogp 1 Ur Ag: NEGATIVE

## 2020-09-07 LAB — MAGNESIUM: Magnesium: 2.4 mg/dL (ref 1.7–2.4)

## 2020-09-07 MED ORDER — FUROSEMIDE 20 MG PO TABS
20.0000 mg | ORAL_TABLET | Freq: Every day | ORAL | Status: DC
  Administered 2020-09-07: 20 mg via ORAL
  Filled 2020-09-07: qty 1

## 2020-09-07 MED ORDER — APIXABAN 5 MG PO TABS
5.0000 mg | ORAL_TABLET | Freq: Two times a day (BID) | ORAL | 1 refills | Status: DC
Start: 2020-09-07 — End: 2020-11-08

## 2020-09-07 MED ORDER — APIXABAN 5 MG PO TABS
10.0000 mg | ORAL_TABLET | Freq: Two times a day (BID) | ORAL | Status: DC
  Filled 2020-09-07: qty 2

## 2020-09-07 MED ORDER — APIXABAN 5 MG PO TABS: 5.0000 mg | ORAL_TABLET | Freq: Two times a day (BID) | ORAL | Status: DC

## 2020-09-07 MED ORDER — DILTIAZEM HCL ER COATED BEADS 300 MG PO CP24
300.0000 mg | ORAL_CAPSULE | Freq: Every day | ORAL | Status: DC
  Administered 2020-09-07: 300 mg via ORAL
  Filled 2020-09-07: qty 1

## 2020-09-07 MED ORDER — DIGOXIN 125 MCG PO TABS
0.1250 mg | ORAL_TABLET | Freq: Every day | ORAL | 1 refills | Status: DC
Start: 2020-09-08 — End: 2020-10-21

## 2020-09-07 MED ORDER — SODIUM CHLORIDE 0.9 % IV SOLN: INTRAVENOUS | Status: DC

## 2020-09-07 MED ORDER — MOMETASONE FURO-FORMOTEROL FUM 100-5 MCG/ACT IN AERO
2.0000 | INHALATION_SPRAY | Freq: Two times a day (BID) | RESPIRATORY_TRACT | Status: DC
  Filled 2020-09-07: qty 8.8

## 2020-09-07 MED ORDER — METOPROLOL SUCCINATE ER 25 MG PO TB24: 100.0000 mg | ORAL_TABLET | Freq: Every day | ORAL | Status: DC

## 2020-09-07 MED ORDER — CEFDINIR 300 MG PO CAPS: 300.0000 mg | ORAL_CAPSULE | Freq: Two times a day (BID) | ORAL | 0 refills | Status: AC

## 2020-09-07 MED ORDER — APIXABAN 5 MG PO TABS
5.0000 mg | ORAL_TABLET | Freq: Two times a day (BID) | ORAL | Status: DC
  Administered 2020-09-07: 5 mg via ORAL

## 2020-09-07 MED ORDER — FUROSEMIDE 20 MG PO TABS
20.0000 mg | ORAL_TABLET | Freq: Every day | ORAL | 1 refills | Status: DC
Start: 2020-09-08 — End: 2020-11-08

## 2020-09-07 MED ORDER — CEFDINIR 300 MG PO CAPS
300.0000 mg | ORAL_CAPSULE | Freq: Two times a day (BID) | ORAL | Status: DC
  Administered 2020-09-07: 300 mg via ORAL
  Filled 2020-09-07 (×2): qty 1

## 2020-09-07 MED ORDER — AZITHROMYCIN 500 MG PO TABS: 500.0000 mg | ORAL_TABLET | Freq: Every day | ORAL | 0 refills | Status: AC

## 2020-09-07 MED ORDER — MOMETASONE FURO-FORMOTEROL FUM 100-5 MCG/ACT IN AERO
2.0000 | INHALATION_SPRAY | Freq: Two times a day (BID) | RESPIRATORY_TRACT | 1 refills | Status: DC
Start: 2020-09-07 — End: 2020-09-14

## 2020-09-07 MED ORDER — GUAIFENESIN ER 600 MG PO TB12: 1200.0000 mg | ORAL_TABLET | Freq: Two times a day (BID) | ORAL | 0 refills | Status: AC

## 2020-09-07 MED ORDER — METOPROLOL SUCCINATE ER 100 MG PO TB24
100.0000 mg | ORAL_TABLET | Freq: Every day | ORAL | 1 refills | Status: DC
Start: 2020-09-07 — End: 2020-11-08

## 2020-09-07 MED ORDER — DIGOXIN 125 MCG PO TABS
0.1250 mg | ORAL_TABLET | Freq: Every day | ORAL | Status: DC
  Administered 2020-09-07: 0.125 mg via ORAL
  Filled 2020-09-07: qty 1

## 2020-09-07 MED ORDER — BENZONATATE 200 MG PO CAPS
200.0000 mg | ORAL_CAPSULE | Freq: Three times a day (TID) | ORAL | 0 refills | Status: DC | PRN
Start: 2020-09-07 — End: 2020-09-14

## 2020-09-07 MED ORDER — DILTIAZEM HCL ER COATED BEADS 300 MG PO CP24
300.0000 mg | ORAL_CAPSULE | Freq: Every day | ORAL | 1 refills | Status: DC
Start: 2020-09-07 — End: 2020-11-08

## 2020-09-07 MED ORDER — LEVALBUTEROL TARTRATE 45 MCG/ACT IN AERO: 2.0000 | INHALATION_SPRAY | RESPIRATORY_TRACT | 0 refills | Status: DC | PRN

## 2020-09-07 NOTE — TOC Initial Note (Signed)
Transition of Care [facility]) - Initial/Assessment Note    Patient Details  Name: [name] MRN: [id number] Date of Birth: Jun 26, 1941  Transition of Care [facility]) CM/SW Contact:    Leeroy Cha, RN Phone Number: 09/07/2020, 7:40 AM  Clinical Narrative:                 79 y.o. male with medical history significant of BPH, HLD, TIA in march this year (saw Dr. [name] in office in April).  Pt presents to ED with c/o fatigue and mild SOB.  Pt seen at [facility] today with c/o "flu symptoms".  Specifically he feels run down, had diarrhea earlier this week though now resolved.  Didn't have COVID vaccine, has had flu vaccine.  No h/o A.Fib, MI.  No palpitations (despite HR).  Mild DOE for past week.  At [facility] pt found to have HR 140.  Sent in to ED. Cardioverted via dc on 110121 to nsr Plan is to return to home Following for progression  Expected Discharge Plan: Home/Self Care Barriers to Discharge: Barriers Resolved (a.fib cardioverted on 110121)   Patient Goals and CMS Choice Patient states their goals for this hospitalization and ongoing recovery are:: to go home CMS Medicare.gov Compare Post Acute Care list provided to:: Patient    Expected Discharge Plan and Services Expected Discharge Plan: Home/Self Care   Discharge Planning Services: CM Consult   Living arrangements for the past 2 months: Single Family Home                                      Prior Living Arrangements/Services Living arrangements for the past 2 months: Single Family Home Lives with:: Spouse Patient language and need for interpreter reviewed:: Yes Do you feel safe going back to the place where you live?: Yes      Need for Family Participation in Patient Care: Yes (Comment) Care giver support system in place?: Yes (comment)   Criminal Activity/Legal Involvement Pertinent to Current Situation/Hospitalization: No - Comment as needed  Activities of Daily Living Home Assistive Devices/Equipment:  Eyeglasses ADL Screening (condition at time of admission) Patient's cognitive ability adequate to safely complete daily activities?: Yes Is the patient deaf or have difficulty hearing?: No Does the patient have difficulty seeing, even when wearing glasses/contacts?: No Does the patient have difficulty concentrating, remembering, or making decisions?: No Patient able to express need for assistance with ADLs?: Yes Does the patient have difficulty dressing or bathing?: No Independently performs ADLs?: Yes (appropriate for developmental age) Does the patient have difficulty walking or climbing stairs?: No Weakness of Legs: None Weakness of Arms/Hands: None  Permission Sought/Granted                  Emotional Assessment Appearance:: Appears stated age Attitude/Demeanor/Rapport: Engaged Affect (typically observed): Calm Orientation: : Oriented to Place, Oriented to Self, Oriented to  Time, Oriented to Situation Alcohol / Substance Use: Not Applicable Psych Involvement: No (comment)  Admission diagnosis:  Pleural effusion [J90] Tachycardia [R00.0] New onset atrial flutter ([place]) [I48.92] Patient Active Problem List   Diagnosis Date Noted  . Mild mitral valve regurgitation 09/05/2020  . Tricuspid valvular regurgitation 09/05/2020  . New onset of congestive heart failure ([place]) 09/03/2020  . Pleural effusion 09/03/2020  . New onset atrial flutter ([place]) 09/03/2020  . Atrial flutter with rapid ventricular response ([place]) 09/03/2020  . Atypical pneumonia 09/03/2020  . Fracture of  proximal phalanx of finger 03/21/2018  . Lumbar back pain with radiculopathy affecting right lower extremity 02/23/2016  . GERD (gastroesophageal reflux disease) 01/30/2015  . Insomnia 01/30/2015  . Benign prostatic hyperplasia with urinary obstruction 11/16/2014  . Benign fibroma of prostate 11/04/2012  . Bladder neck obstruction 11/04/2012  . Elevated prostate specific antigen (PSA) 11/04/2012  . ED  (erectile dysfunction) of organic origin 11/04/2012  . Anxiety 07/06/2011  . Hyperlipidemia 07/06/2011  . BPH (benign prostatic hyperplasia) 07/06/2011  . Erectile dysfunction 07/06/2011  . Peyronie's disease 07/06/2011   PCP:  [name], MD Pharmacy:   [id number] - [place], [place] - [address] [facility] [place] [address] Phone: [phone] Fax: [phone]     Social Determinants of Health (SDOH) Interventions    Readmission Risk Interventions No flowsheet data found.

## 2020-09-07 NOTE — Consult Note (Signed)
Ref: [name], MD   Subjective:  Feeling better.  Chest x-ray is unchanged, may improve in 1-2 weeks.  He is responding to Lasix use. Dioxin, Diltiazem and Toprol are once a day and timing is adjusted. Will check Hgb A1C for mild hyperglycemia. Ambulating well without supplemental oxygen need.  Objective:  Vital Signs in the last 24 hours: Temp:  [97.3 F (36.3 C)-98.3 F (36.8 C)] 97.8 F (36.6 C) (11/02 0740) Pulse Rate:  [77-115] 79 (11/02 0600) Cardiac Rhythm: Normal sinus rhythm;Heart block (11/02 0700) Resp:  [12-26] 26 (11/02 0600) BP: (105-149)/(43-75) 108/54 (11/02 0600) SpO2:  [87 %-99 %] 88 % (11/02 0600) FiO2 (%):  [2 %] 2 % (11/01 2300) Weight:  [80.9 kg-81 kg] 81 kg (11/02 0500)  Physical Exam: BP Readings from Last 1 Encounters:  09/07/20 (!) 108/54     Wt Readings from Last 1 Encounters:  09/07/20 81 kg    Weight change:  Body mass index is 22.93 kg/m. HEENT: [place]/AT, Eyes-Blue, PERL, EOMI, Conjunctiva-Pink, Sclera-Non-icteric Neck: No JVD, No bruit, Trachea midline. Lungs:  Clearing, Bilateral. No wheezing today. Cardiac:  Regular rhythm, normal S1 and S2, no S3. II/VI systolic murmur. Abdomen:  Soft, non-tender. BS present. Extremities:  No edema present. No cyanosis. No clubbing. CNS: AxOx3, Cranial nerves grossly intact, moves all 4 extremities.  Skin: Warm and dry.   Intake/Output from previous day: 11/01 0701 - 11/02 0700 In: 1333.1 [P.O.:360; I.V.:500.9; IV Piggyback:472.2] Out: 2400 [Urine:2400]    Lab Results: BMET    Component Value Date/Time   NA 137 09/07/2020 0304   NA 138 09/06/2020 0323   NA 140 09/05/2020 0748   NA 143 05/03/2018 1030   K 4.2 09/07/2020 0304   K 3.9 09/06/2020 0323   K 3.6 09/05/2020 0748   CL 104 09/07/2020 0304   CL 104 09/06/2020 0323   CL 106 09/05/2020 0748   CO2 24 09/07/2020 0304   CO2 25 09/06/2020 0323   CO2 23 09/05/2020 0748   GLUCOSE 136 (H) 09/07/2020 0304   GLUCOSE 104 (H) 09/06/2020  0323   GLUCOSE 109 (H) 09/05/2020 0748   BUN 24 (H) 09/07/2020 0304   BUN 17 09/06/2020 0323   BUN 15 09/05/2020 0748   BUN 16 05/03/2018 1030   CREATININE 0.91 09/07/2020 0304   CREATININE 1.06 09/06/2020 0323   CREATININE 0.94 09/05/2020 0748   CREATININE 0.79 12/18/2019 0959   CREATININE 0.83 11/19/2018 1022   CREATININE 0.84 01/30/2017 1019   CALCIUM 8.8 (L) 09/07/2020 0304   CALCIUM 8.6 (L) 09/06/2020 0323   CALCIUM 8.6 (L) 09/05/2020 0748   GFRNONAA >60 09/07/2020 0304   GFRNONAA >60 09/06/2020 0323   GFRNONAA >60 09/05/2020 0748   GFRNONAA 87 12/18/2019 0959   GFRNONAA 85 11/19/2018 1022   GFRNONAA 88 01/17/2016 1009   GFRAA 100 12/18/2019 0959   GFRAA 99 11/19/2018 1022   GFRAA 97 05/03/2018 1030   GFRAA >89 01/17/2016 1009   CBC    Component Value Date/Time   WBC 5.3 09/07/2020 0304   RBC 4.73 09/07/2020 0304   HGB 14.0 09/07/2020 0304   HGB 14.8 05/03/2018 1030   HCT 42.3 09/07/2020 0304   HCT 43.2 05/03/2018 1030   PLT 300 09/07/2020 0304   PLT 286 05/03/2018 1030   MCV 89.4 09/07/2020 0304   MCV 87 05/03/2018 1030   MCH 29.6 09/07/2020 0304   MCHC 33.1 09/07/2020 0304   RDW 13.0 09/07/2020 0304   RDW 14.2 05/03/2018  1030   LYMPHSABS 0.6 (L) 09/07/2020 0304   LYMPHSABS 1.4 05/03/2018 1030   MONOABS 0.3 09/07/2020 0304   EOSABS 0.0 09/07/2020 0304   EOSABS 0.2 05/03/2018 1030   BASOSABS 0.0 09/07/2020 0304   BASOSABS 0.0 05/03/2018 1030   HEPATIC Function Panel Recent Labs    12/18/19 0959 05/06/20 0929 09/03/20 1738  PROT 7.1 7.2 8.1   HEMOGLOBIN A1C No components found for: HGA1C,  MPG CARDIAC ENZYMES No results found for: CKTOTAL, CKMB, CKMBINDEX, TROPONINI BNP No results for input(s): PROBNP in the last 8760 hours. TSH Recent Labs    12/18/19 0959 09/04/20 0022  TSH 2.29 2.527   CHOLESTEROL Recent Labs    12/18/19 0959 05/06/20 0929  CHOL 199 185    Scheduled Meds: . azithromycin  500 mg Oral Daily  . Chlorhexidine  Gluconate Cloth  6 each Topical Daily  . digoxin  0.125 mg Oral Daily  . diltiazem  300 mg Oral Daily  . enoxaparin (LOVENOX) injection  1 mg/kg Subcutaneous Q12H  . furosemide  20 mg Oral Daily  . guaiFENesin  1,200 mg Oral BID  . loratadine  10 mg Oral Daily  . mouth rinse  15 mL Mouth Rinse BID  . metoprolol succinate  100 mg Oral Daily  . mometasone-formoterol  2 puff Inhalation BID  . pantoprazole  40 mg Oral Q0600  . rosuvastatin  5 mg Oral Daily   Continuous Infusions: . sodium chloride Stopped (09/07/20 0509)  . ceFEPime (MAXIPIME) IV Stopped (09/07/20 0539)   PRN Meds:.acetaminophen, benzonatate, HYDROcodone-homatropine, ondansetron (ZOFRAN) IV  Assessment/Plan: Atrial flutter to sinus rhythm Atypical pneumonia Bilateral pleural effusion Mild hyperglycemia, r/o type 2 DM Hyperlipidemia  Medications for a.flutter are adjusted. Continue Eliquis, Low dose furosemide and rosuvastatin May need Pulmonary f/u on OP basis for persistent pulmonary effusion.. F/U with me in 1 week.   LOS: 4 days   Time spent including chart review, lab review, examination, discussion with patient : 30 min   Dixie Dials  MD  09/07/2020, 8:36 AM

## 2020-09-07 NOTE — Discharge Summary (Signed)
Physician Discharge Summary  [name] HDQ:[id number] DOB: 12-17-1940 DOA: 09/03/2020  PCP: [name], MD  Admit date: 09/03/2020 Discharge date: 09/07/2020  Time spent: 55 minutes  Recommendations for Outpatient Follow-up:  1. Follow-up with Wyn Quaker, NP, pulmonary 09/20/2020 2. Follow-up with Dr. [name] in 1 week.  On follow-up patient will need a basic metabolic profile done to follow-up on electrolytes and renal function. 3. Follow-up with [name], MD in 2 to 3 weeks.   Discharge Diagnoses:  Principal Problem:   Atrial flutter with rapid ventricular response (HCC) Active Problems:   Hyperlipidemia   GERD (gastroesophageal reflux disease)   New onset of congestive heart failure (HCC)   Pleural effusion   New onset atrial flutter (HCC)   Atypical pneumonia   Mild mitral valve regurgitation   Tricuspid valvular regurgitation   Discharge Condition: Stable and improved  Diet recommendation: Heart healthy  Filed Weights   09/03/20 2339 09/06/20 1251 09/07/20 0500  Weight: 80.9 kg 80.9 kg 81 kg    History of present illness:  HPI per Dr. [name] is a 79 y.o. male with medical history significant of BPH, HLD, TIA in march this year (saw Dr. [name] in office in April).  Pt presents to ED with c/o fatigue and mild SOB.  Pt seen at [facility] today with c/o "flu symptoms".  Specifically he feels run down, had diarrhea earlier this week though now resolved.  Didn't have COVID vaccine, has had flu vaccine.  No h/o A.Fib, MI.  No palpitations (despite HR).  Mild DOE for past week.  At [facility] pt found to have HR 140.  Sent in to ED.   ED Course: HR persistently 140 in ED.  CTA neg for PE.  Does show mod-large B pleural effusions, no loculations.  ? Atypical PNA in lung apices.  COVID neg  WBC 8k  Procalcitonin pending.  Started on IV cardizem without much effect.  Vagal maneuver done by Dr. [name] which proved that pt was indeed in  A.Flutter 2:1.  Given 5mg  IV metoprolol and now A.Flutter with rate of 110.  Hospital Course:  1 new onset atrial flutter with RVR CHA2DS2VASC score at least 4 Questionable etiology.  Concern may be coming from pulmonary etiology as CT angiogram chest with findings concerning for atypical pneumonia versus inflammation in the biapical lobes as well as a moderate bilateral pleural effusions.  COVID-19 PCR negative.  Influenza PCR negative.  2D echo  2D echo with EF 60 to 65%, no wall motion abnormalities, moderately dilated left atrial size, severely dilated right atrial size, mild to moderate mitral valve regurgitation, severe tricuspid valvular regurgitation.  TSH within normal limits.  Patient was on a Cardizem drip and transitioned to oral Cardizem per cardiology (09/04/2020). Patient placed on Cardizem 90 mg every 8 hours and metoprolol 50 mg every 8 hours, and digoxin.   Patient also initially started on IV heparin and placed on full dose Lovenox.  Patient continued to be in A. fib and subsequently underwent a TEE cardioversion per Dr. [name] on 09/06/2020 which was successful.  Patient remained in sinus rhythm.  Heart medications were consolidated  Cardizem CD 300 mg daily, digoxin 0.125 mg daily, Toprol-XL 100 mg daily per cardiology recommendations.  Lovenox was transitioned to Eliquis which patient will be discharged home on.  Outpatient follow-up with cardiology 1 week post discharge.   2.  Abnormal CT chest/atypical pneumonia CT angiogram chest with superimposed biapical pulmonary infiltrates and associated  bronchial wall thickening suspicious for changes of atypical infection, moderate to large bilateral dependent, bland appearing pleural effusions with associated bilateral compressive atelectasis.  Patient did present with complaints of fatigue and shortness of breath for about 5 days preceded by symptoms of viral upper respiratory infection. Urine pneumococcus antigen negative. Urine  Legionella antigen pending. COVID-19 PCR negative. Influenza A and B PCR negative. Doxycycline discontinued.  Patient placed on IV cefepime, azithromycin, Mucinex. Repeat chest x-ray(09/05/2020) with worsening lung aeration compared to prior study, increased airspace lung opacities noted in the upper and lower lungs consistent with multifocal pneumonia.  Mild increase in bilateral pleural effusions.  Patient has been assessed by pulmonary who recommend follow-up CT in 1 to 2 months as an outpatient for reevaluation and treatment for possible atypical infection, pneumonia.  Patient be discharged home on 4 more days of oral Omnicef and 2 more days of azithromycin to complete a 7-day course of antibiotic treatment.  Outpatient follow-up with pulmonary.  3.  Moderate to large bilateral pleural effusion probably secondary to acute diastolic CHF. Noted on CT angiogram chest.  Patient had presented fatigue and shortness of breath.  BNP within normal limits. Patient with hypoxia. Concern for volume overload/secondary to CHF from A. fib. 2D echo with EF 60 to 65%, no wall motion abnormalities, moderately dilated left atrial size, severely dilated right atrial size, mild to moderate mitral valve regurgitation, severe tricuspid valvular regurgitation.  Patient started on Lasix 20 mg IV every 12 hours with good urine output during the hospitalization and was -4 L during the hospitalization.  Patient improved clinically.  Patient seen by pulmonary who feel bilateral effusions are secondary to heart failure, A. fib and diuresis as tolerated. Per pulmonary no role for thoracentesis at this time.  Patient improved clinically with sats of 95% on room air and sats of 91% on room air on ambulation by day of discharge.   Patient be discharged home on low-dose Lasix 20 mg daily per cardiology recommendations.  Outpatient follow-up with pulmonary and cardiology.  4.  Hyperlipidemia Patient maintained on statin.  Outpatient  follow-up.   5. Mild to moderate mitral valvular regurgitation/severe tricuspid valvular regurgitation Per 2D echo. Management per cardiology.  6. GERD Patient maintained on a PPI and H2 blocker.  Patient be discharged back on home regimen of H2 blocker.  Outpatient follow-up.  Procedures:  2D echo 09/04/2020  CT angiogram chest 09/03/2020  Chest x-ray 09/03/2020  TEE cardioversion 09/06/2020 by Dr. [name]   Consultations:  Cardiology: Dr. [name] 09/03/2020  PCCM: Dr. [name] 09/04/2020  Discharge Exam: Vitals:   09/07/20 0740 09/07/20 0800  BP:  (!) 109/49  Pulse:  69  Resp:  14  Temp: 97.8 F (36.6 C)   SpO2:  92%    General: NAD Cardiovascular: RRR Respiratory: CTAB  Discharge Instructions   Discharge Instructions    Diet - low sodium heart healthy   Complete by: As directed    Increase activity slowly   Complete by: As directed      Allergies as of 09/07/2020      Reactions   Wellbutrin [bupropion Hcl] Rash      Medication List    STOP taking these medications   aspirin EC 81 MG tablet     TAKE these medications   apixaban 5 MG Tabs tablet Commonly known as: ELIQUIS Take 1 tablet (5 mg total) by mouth 2 (two) times daily.   azithromycin 500 MG tablet Commonly known as: ZITHROMAX Take 1 tablet (  500 mg total) by mouth daily for 2 days. Start taking on: September 08, 2020   benzonatate 200 MG capsule Commonly known as: TESSALON Take 1 capsule (200 mg total) by mouth 3 (three) times daily as needed for cough.   cefdinir 300 MG capsule Commonly known as: OMNICEF Take 1 capsule (300 mg total) by mouth every 12 (twelve) hours for 4 days.   cetirizine 10 MG tablet Commonly known as: ZYRTEC Take 10 mg by mouth daily.   digoxin 0.125 MG tablet Commonly known as: LANOXIN Take 1 tablet (0.125 mg total) by mouth daily. Start taking on: September 08, 2020   diltiazem 300 MG 24 hr capsule Commonly known as: CARDIZEM CD Take 1 capsule (300  mg total) by mouth daily.   famotidine 20 MG tablet Commonly known as: PEPCID Take 1 tablet (20 mg total) by mouth daily.   furosemide 20 MG tablet Commonly known as: LASIX Take 1 tablet (20 mg total) by mouth daily. Start taking on: September 08, 2020   guaiFENesin 600 MG 12 hr tablet Commonly known as: MUCINEX Take 2 tablets (1,200 mg total) by mouth 2 (two) times daily for 4 days.   levalbuterol 45 MCG/ACT inhaler Commonly known as: XOPENEX HFA Inhale 2 puffs into the lungs every 4 (four) hours as needed for wheezing or shortness of breath.   metoprolol succinate 100 MG 24 hr tablet Commonly known as: TOPROL-XL Take 1 tablet (100 mg total) by mouth daily.   mometasone-formoterol 100-5 MCG/ACT Aero Commonly known as: DULERA Inhale 2 puffs into the lungs 2 (two) times daily.   rosuvastatin 5 MG tablet Commonly known as: Crestor Take 1 tablet (5 mg total) by mouth at bedtime. What changed: when to take this   zolpidem 10 MG tablet Commonly known as: AMBIEN Take 1 tablet (10 mg total) by mouth at bedtime as needed for sleep.      Allergies  Allergen Reactions  . Wellbutrin [Bupropion Hcl] Rash    Follow-up Information    Lauraine Rinne, NP Follow up on 09/20/2020.   Specialty: Pulmonary Disease Why: Appt at [facility].  Please arrive at 1:45 for check in.  Contact information: [address] [address] [phone]        Dixie Dials, MD. Schedule an appointment as soon as possible for a visit in 1 week(s).   Specialty: Cardiology Why: A. flutter f/u. Contact information: [place] [address] [phone]        [name], MD. Schedule an appointment as soon as possible for a visit in 2 week(s).   Specialty: Internal Medicine Why: f/u in 2-3 weeks. Contact information: [address] [address] [phone]                The results of significant diagnostics from this  hospitalization (including imaging, microbiology, ancillary and laboratory) are listed below for reference.    Significant Diagnostic Studies: DG Chest 2 View  Result Date: 09/03/2020 CLINICAL DATA:  Short of breath EXAM: CHEST - 2 VIEW COMPARISON:  01/17/1999 21 FINDINGS: Small bilateral pleural effusions. No consolidation. Normal heart size. No pneumothorax. IMPRESSION: Small bilateral pleural effusions. Electronically Signed   By: Donavan Foil M.D.   On: 09/03/2020 17:29   CT Angio Chest PE W and/or Wo Contrast  Result Date: 09/03/2020 CLINICAL DATA:  Tachycardia, dyspnea EXAM: CT ANGIOGRAPHY CHEST WITH CONTRAST TECHNIQUE: Multidetector CT imaging of the chest was performed using the standard protocol  during bolus administration of intravenous contrast. Multiplanar CT image reconstructions and MIPs were obtained to evaluate the vascular anatomy. CONTRAST:  168mL OMNIPAQUE IOHEXOL 350 MG/ML SOLN COMPARISON:  None. FINDINGS: Cardiovascular: There is adequate opacification of the pulmonary arterial tree. There is no intraluminal filling defect identified to suggest acute pulmonary embolism. The central pulmonary arteries are mildly enlarged in keeping with changes of pulmonary arterial hypertension. Minimal coronary artery calcification. Global cardiac size is within normal limits. The thoracic aorta is of normal caliber. Mild atherosclerotic calcification is seen scattered throughout the thoracic aorta. Mediastinum/Nodes: Thyroid unremarkable. No pathologic thoracic adenopathy. Esophagus unremarkable. Lungs/Pleura: Moderate to large bilateral dependently layering pleural effusions are present with compressive atelectasis of the dependent lungs bilaterally. There is superimposed asymmetric biapical ground-glass pulmonary infiltrate, more severe within the left upper lobe, suspicious for acute infection the appropriate clinical setting. There is associated bronchial wall thickening asymmetrically  involving the upper lobes bilaterally in keeping with airway inflammation. A a 13 mm intrapleural pulmonary nodule may represent a enlarged intrapulmonary lymph node. No central obstructing lesion. No pneumothorax. Upper Abdomen: No acute abnormality. Musculoskeletal: No lytic or blastic bone lesions. No acute bone abnormality. Review of the MIP images confirms the above findings. IMPRESSION: No pulmonary embolism. Moderate to large bilateral dependent, bland appearing pleural effusions with associated bilateral compressive atelectasis. Superimposed biapical pulmonary infiltrates and associated bronchial wall thickening suspicious for changes of atypical infection in the appropriate clinical setting. Aortic aneurysm NOS (ICD10-I71.9). Electronically Signed   By: Fidela Salisbury MD   On: 09/03/2020 19:38   DG CHEST PORT 1 VIEW  Result Date: 09/07/2020 CLINICAL DATA:  Hypoxia. EXAM: PORTABLE CHEST 1 VIEW COMPARISON:  09/05/2020.  CT 09/03/2020. FINDINGS: Heart size stable. Persistent bilateral upper lobe pulmonary infiltrates. Persistent bibasilar atelectasis and bilateral pleural effusions. Chest is unchanged from prior exam. No pneumothorax. Biapical pleural thickening consistent scarring. IMPRESSION: Persistent bilateral upper lobe pulmonary infiltrates. Persistent bibasilar atelectasis and bilateral pleural effusions. Chest is unchanged from prior exam. Electronically Signed   By: Marcello Moores  Register   On: 09/07/2020 06:04   DG CHEST PORT 1 VIEW  Result Date: 09/05/2020 CLINICAL DATA:  Short of breath. EXAM: PORTABLE CHEST 1 VIEW COMPARISON:  09/03/2020 FINDINGS: Airspace opacities in the upper and lower lungs bilaterally have increased from the prior exam. Bilateral pleural effusions likely increased in size. Lungs remain hyperexpanded. No pneumothorax. IMPRESSION: 1. Worsening lung aeration compared to the prior study. Increased airspace lung opacities noted in the upper and lower lungs consistent with  multifocal pneumonia. Mild increase in bilateral pleural effusions. Electronically Signed   By: Lajean Manes M.D.   On: 09/05/2020 10:27   ECHOCARDIOGRAM COMPLETE  Result Date: 09/04/2020    ECHOCARDIOGRAM REPORT   Patient Name:   [name] Date of Exam: 09/04/2020 Medical Rec #:  [id number]       Height:       74.0 in Accession #:    [id number]      Weight:       178.4 lb Date of Birth:  1940-12-30        BSA:          2.070 m Patient Age:    5 years        BP:           108/50 mmHg Patient Gender: M               HR:           78  bpm. Exam Location:  Inpatient Procedure: 2D Echo Indications:     Atrial Flutter I48.92  History:         Patient has prior history of Echocardiogram examinations, most                  recent 01/17/2020. Risk Factors:Dyslipidemia.  Sonographer:     Mikki Santee RDCS (AE) Referring Phys:  [place] Diagnosing Phys: Dixie Dials MD IMPRESSIONS  1. Left ventricular ejection fraction, by estimation, is 60 to 65%. The left ventricle has normal function. The left ventricle has no regional wall motion abnormalities. Left ventricular diastolic parameters are indeterminate.  2. Right ventricular systolic function is normal. The right ventricular size is normal. There is normal pulmonary artery systolic pressure.  3. Left atrial size was moderately dilated.  4. Right atrial size was severely dilated.  5. The mitral valve is degenerative. Mild to moderate mitral valve regurgitation.  6. Tricuspid valve regurgitation is severe.  7. The aortic valve is bicuspid. There is mild calcification of the aortic valve. There is mild thickening of the aortic valve. Aortic valve regurgitation is not visualized. Mild aortic valve sclerosis is present, with no evidence of aortic valve stenosis.  8. The inferior vena cava is dilated in size with <50% respiratory variability, suggesting right atrial pressure of 15 mmHg. FINDINGS  Left Ventricle: Left ventricular ejection fraction, by  estimation, is 60 to 65%. The left ventricle has normal function. The left ventricle has no regional wall motion abnormalities. The left ventricular internal cavity size was normal in size. There is  no left ventricular hypertrophy. Left ventricular diastolic parameters are indeterminate. Right Ventricle: The right ventricular size is normal. No increase in right ventricular wall thickness. Right ventricular systolic function is normal. There is normal pulmonary artery systolic pressure. The tricuspid regurgitant velocity is 2.40 m/s, and  with an assumed right atrial pressure of 8 mmHg, the estimated right ventricular systolic pressure is 01.6 mmHg. Left Atrium: Left atrial size was moderately dilated. Right Atrium: Right atrial size was severely dilated. Pericardium: There is no evidence of pericardial effusion. Mitral Valve: The mitral valve is degenerative in appearance. There is mild thickening of the mitral valve leaflet(s). There is mild calcification of the mitral valve leaflet(s). Normal mobility of the mitral valve leaflets. Mild mitral annular calcification. Mild to moderate mitral valve regurgitation. Tricuspid Valve: The tricuspid valve is normal in structure. Tricuspid valve regurgitation is severe. Aortic Valve: The aortic valve is bicuspid. There is mild calcification of the aortic valve. There is mild thickening of the aortic valve. There is mild aortic valve annular calcification. Aortic valve regurgitation is not visualized. Mild aortic valve sclerosis is present, with no evidence of aortic valve stenosis. Pulmonic Valve: The pulmonic valve was normal in structure. Pulmonic valve regurgitation is not visualized. Aorta: The aortic root is normal in size and structure. There is minimal (Grade I) atheroma plaque involving the aortic root, ascending aorta and descending aorta. Venous: The inferior vena cava is dilated in size with less than 50% respiratory variability, suggesting right atrial pressure  of 15 mmHg. IAS/Shunts: The atrial septum is grossly normal.  LEFT VENTRICLE PLAX 2D LVIDd:         4.70 cm LVIDs:         3.10 cm LV PW:         1.00 cm LV IVS:        1.00 cm LVOT diam:     2.40 cm LV  SV:         67 LV SV Index:   32 LVOT Area:     4.52 cm  RIGHT VENTRICLE RV S prime:     11.70 cm/s TAPSE (M-mode): 1.5 cm LEFT ATRIUM              Index       RIGHT ATRIUM           Index LA diam:        3.60 cm  1.74 cm/m  RA Area:     19.80 cm LA Vol (A2C):   111.0 ml 53.62 ml/m RA Volume:   65.90 ml  31.83 ml/m LA Vol (A4C):   59.0 ml  28.50 ml/m LA Biplane Vol: 82.5 ml  39.85 ml/m  AORTIC VALVE LVOT Vmax:   72.15 cm/s LVOT Vmean:  51.950 cm/s LVOT VTI:    0.149 m  AORTA Ao Root diam: 4.10 cm TRICUSPID VALVE TR Peak grad:   23.0 mmHg TR Vmax:        240.00 cm/s  SHUNTS Systemic VTI:  0.15 m Systemic Diam: 2.40 cm Dixie Dials MD Electronically signed by Dixie Dials MD Signature Date/Time: 09/04/2020/3:17:22 PM    Final    ECHO TEE  Result Date: 09/06/2020    TRANSESOPHOGEAL ECHO REPORT   Patient Name:   [name] Date of Exam: 09/06/2020 Medical Rec #:  [id number]       Height:       74.0 in Accession #:    [id number]      Weight:       178.4 lb Date of Birth:  02-Dec-1940        BSA:          2.070 m Patient Age:    79 years        BP:           149/62 mmHg Patient Gender: M               HR:           104 bpm. Exam Location:  Inpatient Procedure: Transesophageal Echo, Cardiac Doppler, Color Doppler and Saline            Contrast Bubble Study Indications:     Aflutter  History:         Patient has prior history of Echocardiogram examinations, most                  recent 09/04/2020. CHF, TIA, Arrythmias:Atrial Flutter,                  Signs/Symptoms:pleural effusion; Risk Factors:Dyslipidemia.  Sonographer:     Dustin Flock RDCS Referring Phys:  [place] Diagnosing Phys: Dixie Dials MD PROCEDURE: The transesophogeal probe was passed without difficulty through the esophogus of the  patient. Sedation performed by different physician. The patient was monitored while under deep sedation. Anesthestetic sedation was provided intravenously by Anesthesiology: 304.27mg  of Propofol. Image quality was good. The patient developed no complications during the procedure. A successful direct current cardioversion was performed at 120 joules with 1 attempt. IMPRESSIONS  1. Left ventricular ejection fraction, by estimation, is 55 to 60%. The left ventricle has normal function. Left ventricular diastolic function could not be evaluated.  2. Right ventricular systolic function is normal. The right ventricular size is normal.  3. Left atrial size was moderately dilated. No left atrial/left atrial appendage thrombus was detected.  4. Right atrial size was  moderately dilated.  5. The pericardial effusion is posterior to the left ventricle.  6. The mitral valve is normal in structure. Mild to moderate mitral valve regurgitation. No evidence of mitral stenosis.  7. Tricuspid valve regurgitation is moderate.  8. The aortic valve is tricuspid. There is mild calcification of the aortic valve. There is mild thickening of the aortic valve. Aortic valve regurgitation is mild. Mild aortic valve sclerosis is present, with no evidence of aortic valve stenosis.  9. There is Moderate (Grade III) atheroma plaque involving the aortic root, ascending aorta, transverse aorta and descending aorta. 10. The inferior vena cava is normal in size with <50% respiratory variability, suggesting right atrial pressure of 8 mmHg. FINDINGS  Left Ventricle: Left ventricular ejection fraction, by estimation, is 55 to 60%. The left ventricle has normal function. The left ventricular internal cavity size was normal in size. There is no left ventricular hypertrophy. Left ventricular diastolic function could not be evaluated. Right Ventricle: The right ventricular size is normal. No increase in right ventricular wall thickness. Right ventricular  systolic function is normal. Left Atrium: Left atrial size was moderately dilated. Spontaneous echo contrast was present in the left atrium. No left atrial/left atrial appendage thrombus was detected. Right Atrium: Right atrial size was moderately dilated. Pericardium: Trivial pericardial effusion is present. The pericardial effusion is posterior to the left ventricle. Mitral Valve: The mitral valve is normal in structure. There is mild thickening of the mitral valve leaflet(s). There is mild calcification of the mitral valve leaflet(s). Mild mitral annular calcification. Mild to moderate mitral valve regurgitation, with centrally-directed jet. No evidence of mitral valve stenosis. Tricuspid Valve: The tricuspid valve is normal in structure. Tricuspid valve regurgitation is moderate . No evidence of tricuspid stenosis. Aortic Valve: The aortic valve is tricuspid. There is mild calcification of the aortic valve. There is mild thickening of the aortic valve. There is mild aortic valve annular calcification. Aortic valve regurgitation is mild. Mild aortic valve sclerosis is present, with no evidence of aortic valve stenosis. Aortic valve peak gradient measures 4.9 mmHg. Pulmonic Valve: The pulmonic valve was normal in structure. Pulmonic valve regurgitation is trivial. No evidence of pulmonic stenosis. Aorta: The aortic root is normal in size and structure. There is moderate (Grade III) atheroma plaque involving the aortic root, ascending aorta, transverse aorta and descending aorta. Venous: The left upper pulmonary vein, left lower pulmonary vein, right upper pulmonary vein and right lower pulmonary vein are normal. The inferior vena cava is normal in size with less than 50% respiratory variability, suggesting right atrial pressure of 8 mmHg. IAS/Shunts: No atrial level shunt detected by color flow Doppler. Agitated saline contrast was given intravenously to evaluate for intracardiac shunting. There is no evidence of  a patent foramen ovale.  AORTIC VALVE AV Vmax:      111.00 cm/s AV Peak Grad: 4.9 mmHg Dixie Dials MD Electronically signed by Dixie Dials MD Signature Date/Time: 09/06/2020/11:08:50 PM    Final     Microbiology: Recent Results (from the past 240 hour(s))  Respiratory Panel by RT PCR (Flu A&B, Covid) - Nasopharyngeal Swab     Status: None   Collection Time: 09/03/20  8:02 PM   Specimen: Nasopharyngeal Swab  Result Value Ref Range Status   SARS Coronavirus 2 by RT PCR NEGATIVE NEGATIVE Final    Comment: (NOTE) SARS-CoV-2 target nucleic acids are NOT DETECTED.  The SARS-CoV-2 RNA is generally detectable in upper respiratoy specimens during the acute phase of infection. The  lowest concentration of SARS-CoV-2 viral copies this assay can detect is 131 copies/mL. A negative result does not preclude SARS-Cov-2 infection and should not be used as the sole basis for treatment or other patient management decisions. A negative result may occur with  improper specimen collection/handling, submission of specimen other than nasopharyngeal swab, presence of viral mutation(s) within the areas targeted by this assay, and inadequate number of viral copies (<131 copies/mL). A negative result must be combined with clinical observations, patient history, and epidemiological information. The expected result is Negative.  Fact Sheet for Patients:  PinkCheek.be  Fact Sheet for Healthcare Providers:  GravelBags.it  This test is no t yet approved or cleared by the [place] FDA and  has been authorized for detection and/or diagnosis of SARS-CoV-2 by FDA under an Emergency Use Authorization (EUA). This EUA will remain  in effect (meaning this test can be used) for the duration of the COVID-19 declaration under Section 564(b)(1) of the Act, 21 U.S.C. section 360bbb-3(b)(1), unless the authorization is terminated or revoked sooner.      Influenza A by PCR NEGATIVE NEGATIVE Final   Influenza B by PCR NEGATIVE NEGATIVE Final    Comment: (NOTE) The Xpert Xpress SARS-CoV-2/FLU/RSV assay is intended as an aid in  the diagnosis of influenza from Nasopharyngeal swab specimens and  should not be used as a sole basis for treatment. Nasal washings and  aspirates are unacceptable for Xpert Xpress SARS-CoV-2/FLU/RSV  testing.  Fact Sheet for Patients: PinkCheek.be  Fact Sheet for Healthcare Providers: GravelBags.it  This test is not yet approved or cleared by the [place] FDA and  has been authorized for detection and/or diagnosis of SARS-CoV-2 by  FDA under an Emergency Use Authorization (EUA). This EUA will remain  in effect (meaning this test can be used) for the duration of the  Covid-19 declaration under Section 564(b)(1) of the Act, 21  U.S.C. section 360bbb-3(b)(1), unless the authorization is  terminated or revoked. Performed at [facility], [place] [address] [place], [place] [address]   MRSA PCR Screening     Status: None   Collection Time: 09/04/20 10:36 AM   Specimen: Nasal Mucosa; Nasopharyngeal  Result Value Ref Range Status   MRSA by PCR NEGATIVE NEGATIVE Final    Comment:        The GeneXpert MRSA Assay (FDA approved for NASAL specimens only), is one component of a comprehensive MRSA colonization surveillance program. It is not intended to diagnose MRSA infection nor to guide or monitor treatment for MRSA infections. Performed at [facility], [place] [address] [place], [place] [address]   Culture, sputum-assessment     Status: None   Collection Time: 09/07/20  5:08 AM   Specimen: Expectorated Sputum  Result Value Ref Range Status   Specimen Description EXPECTORATED SPUTUM  Final   Special Requests NONE  Final   Sputum evaluation   Final    THIS SPECIMEN IS ACCEPTABLE FOR SPUTUM CULTURE Performed at  [facility], [place] [address], [place], [place] [address]    Report Status 09/07/2020 FINAL  Final     Labs: Basic Metabolic Panel: Recent Labs  Lab 09/03/20 1738 09/03/20 1738 09/03/20 1843 09/04/20 0259 09/05/20 0748 09/06/20 0323 09/07/20 0304  NA 137  --   --  139 140 138 137  K 5.9*   < > 4.0 4.1 3.6 3.9 4.2  CL 104  --   --  105 106 104 104  CO2 22  --   --  27 23 25 24   GLUCOSE 98  --   --  128* 109* 104* 136*  BUN 13  --   --  10 15 17  24*  CREATININE 1.03  --   --  0.87 0.94 1.06 0.91  CALCIUM 8.9  --   --  8.6* 8.6* 8.6* 8.8*  MG  --   --  2.4  --  2.2 2.3 2.4  PHOS  --   --   --   --   --   --  3.2   < > = values in this interval not displayed.   Liver Function Tests: Recent Labs  Lab 09/03/20 1738 09/07/20 0304  AST 50*  --   ALT 22  --   ALKPHOS 93  --   BILITOT 1.6*  --   PROT 8.1  --   ALBUMIN 4.2 3.2*   Recent Labs  Lab 09/03/20 1738  LIPASE 27   No results for input(s): AMMONIA in the last 168 hours. CBC: Recent Labs  Lab 09/03/20 1738 09/04/20 0259 09/05/20 0748 09/06/20 0323 09/07/20 0304  WBC 8.4 6.5 6.0 6.6 5.3  NEUTROABS  --   --  4.0 4.2 4.3  HGB 16.1 13.9 13.9 13.7 14.0  HCT 48.8 41.2 41.4 41.9 42.3  MCV 90.2 90.4 90.4 89.3 89.4  PLT 339 292 286 273 300   Cardiac Enzymes: No results for input(s): CKTOTAL, CKMB, CKMBINDEX, TROPONINI in the last 168 hours. BNP: BNP (last 3 results) Recent Labs    09/03/20 1738 09/05/20 0748  BNP 57.4 69.3    ProBNP (last 3 results) No results for input(s): PROBNP in the last 8760 hours.  CBG: No results for input(s): GLUCAP in the last 168 hours.     Signed:  Irine Seal MD.  [facility] 09/07/2020, 9:59 AM

## 2020-09-07 NOTE — Evaluation (Signed)
Physical Therapy Evaluation Patient Details Name: [name] MRN: [id number] DOB: 11-13-1940 Today's Date: 09/07/2020   History of Present Illness  HR persistently 140 in ED.  CTA neg for PE.  Does show mod-large B pleural effusions, no loculations.  ? Atypical PNA in lung apices. [name] is a 79 y.o. male with medical history significant of BPH, HLD, TIA in march this year, admitted ,fatigue, diarrhea, in A flutter. S/P TEE and DC cardioversion  09/06/20  Clinical Impression  The patient ambulated x 250' On RA, SPO2 >91 %. Patient 's BP stable( see doc flow sheets). Patient was noted to be a little sweaty post ambulation. Patient's HR 74-88.Marland Kitchen Patient reports having close friends to assit with any ADL's. Patient currently does not require an Assistive device.  Pt admitted with above diagnosis. Pt currently with functional limitations due to the deficits listed below (see PT Problem List). Pt will benefit from skilled PT to increase their independence and safety with mobility to allow discharge to the venue listed below.       Follow Up Recommendations No PT follow up    Equipment Recommendations  None recommended by PT    Recommendations for Other Services       Precautions / Restrictions Precautions Precautions: None Restrictions Weight Bearing Restrictions: No      Mobility  Bed Mobility Overal bed mobility: Independent                  Transfers Overall transfer level: Needs assistance Equipment used: None Transfers: Sit to/from Stand Sit to Stand: Supervision            Ambulation/Gait Ambulation/Gait assistance: Min guard Gait Distance (Feet): 250 Feet Assistive device: None Gait Pattern/deviations: Step-through pattern;Drifts right/left Gait velocity: decreased Gait velocity interpretation: <1.31 ft/sec, indicative of household ambulator General Gait Details: pt reported that he felt like he was slightly listing to the left while  ambulating  Stairs            Wheelchair Mobility    Modified Rankin (Stroke Patients Only)       Balance Overall balance assessment: Mild deficits observed, not formally tested                                           Pertinent Vitals/Pain Pain Assessment: No/denies pain    Home Living Family/patient expects to be discharged to:: Private residence Living Arrangements: Alone   Type of Home: House Home Access: Stairs to enter   [employer] of Steps: 2-3 STE Home Layout: One level Home Equipment: Grab bars - tub/shower;Shower seat - built in Additional Comments: has multiple friends very close by    Prior Function Level of Independence: Independent               Hand Dominance   Dominant Hand: Right    Extremity/Trunk Assessment   Upper Extremity Assessment Upper Extremity Assessment: Defer to OT evaluation    Lower Extremity Assessment Lower Extremity Assessment: Generalized weakness    Cervical / Trunk Assessment Cervical / Trunk Assessment: Normal  Communication      Cognition Arousal/Alertness: Awake/alert Behavior During Therapy: WFL for tasks assessed/performed Overall Cognitive Status: Within Functional Limits for tasks assessed  General Comments      Exercises     Assessment/Plan    PT Assessment Patient needs continued PT services  PT Problem List Decreased strength;Decreased mobility;Decreased balance       PT Treatment Interventions Gait training;Functional mobility training;Patient/family education;Therapeutic activities    PT Goals (Current goals can be found in the Care Plan section)  Acute Rehab PT Goals Patient Stated Goal: to feel better, get back to my work outside PT Goal Formulation: With patient Time For Goal Achievement: 09/21/20 Potential to Achieve Goals: Good    Frequency Min 3X/week   Barriers to discharge         Co-evaluation               AM-PAC PT "6 Clicks" Mobility  Outcome Measure Help needed turning from your back to your side while in a flat bed without using bedrails?: None Help needed moving from lying on your back to sitting on the side of a flat bed without using bedrails?: None Help needed moving to and from a bed to a chair (including a wheelchair)?: None Help needed standing up from a chair using your arms (e.g., wheelchair or bedside chair)?: None Help needed to walk in hospital room?: A Little Help needed climbing 3-5 steps with a railing? : A Little 6 Click Score: 22    End of Session   Activity Tolerance: Patient tolerated treatment well Patient left: in chair;with call bell/phone within reach Nurse Communication: Mobility status PT Visit Diagnosis: Unsteadiness on feet (R26.81)    Time: 5053-9767 PT Time Calculation (min) (ACUTE ONLY): 31 min   Charges:   PT Evaluation $PT Eval Low Complexity: [place] Pager [phone] Office [phone]   Claretha Cooper 09/07/2020, 9:22 AM

## 2020-09-07 NOTE — Progress Notes (Signed)
SATURATION QUALIFICATIONS: (This note is used to comply with regulatory documentation for home oxygen)  Patient Saturations on Room Air at Rest = 95%  Patient Saturations on Room Air while Ambulating = 91%  Patient Saturations on    --Liters of oxygen while Ambulating = % NT using O2  Please briefly explain why patient needs home oxygen:Not required, saturation >88% while ambulating.  [place] Pager [phone] Office [phone]

## 2020-09-07 NOTE — Progress Notes (Signed)
Pharmacy Antibiotic Note  [name] is a 79 y.o. male admitted on 09/03/2020 with atypical pneumonia.  Pharmacy has been consulted for cefepime dosing.  Day #4 Cefepime/Azithro - Afebrile - WBC 5.3 - SCr 0.91, CrCl 76 ml/min - PCT < 0.10  Plan: Continue Cefepime 2g IV q8 per current renal function Azithro per MD Plan noted for 7 days tx per CCM  Height: 6\' 2"  (188 cm) Weight: 81 kg (178 lb 9.2 oz) IBW/kg (Calculated) : 82.2  Temp (24hrs), Avg:98 F (36.7 C), Min:97.3 F (36.3 C), Max:98.3 F (36.8 C)  Recent Labs  Lab 09/03/20 1738 09/04/20 0259 09/05/20 0748 09/06/20 0323 09/07/20 0304  WBC 8.4 6.5 6.0 6.6 5.3  CREATININE 1.03 0.87 0.94 1.06 0.91    Estimated Creatinine Clearance: 76.6 mL/min (by C-G formula based on SCr of 0.91 mg/dL).    Allergies  Allergen Reactions  . Wellbutrin [Bupropion Hcl] Rash   Antimicrobials this admission: 10/30 doxy x 1 10/30 zithro >> 10/30 cefepime >>   Microbiology results: 10/30 MRSA PCR: neg 10/29 covid/flu neg 10/30 strep pneumo neg 10/30 legionella ip 10/30 HIV NR 11/2 Sputum:  Thank you for allowing pharmacy to be a part of this patient's care.  Peggyann Juba, PharmD, BCPS Pharmacy: [phone] 09/07/2020 8:51 AM

## 2020-09-07 NOTE — Progress Notes (Signed)
Occupational Therapy Evaluation  Patient with functional deficits listed below impacting safety and independence with self care. At [place], enjoys working outdoors. Currently patient supervision level for functional transfers and self care due to mild unsteadiness and decreased activity tolerance. Recommend continued acute OT services in order to facilitate to D/C venue listed below.    09/07/20 1100  OT Visit Information  Last OT Received On 09/07/20  Assistance Needed +1  History of Present Illness [name] is a 79 y.o. male with medical history significant of BPH, HLD, TIA in march this year, admitted ,fatigue, diarrhea, in A flutter. S/P TEE and DC cardioversion  09/06/20  Precautions  Precautions None  Home Living  Family/patient expects to be discharged to: Private residence  Living Arrangements Alone  Available Help at Discharge Friend(s);Neighbor  Type of [place] to enter  [employer] of Steps 2-3   Home Layout One level  Bathroom Shower/Tub [employer] Grab bars - tub/shower;Shower seat - built in  Additional Comments has multiple friends very close by  Prior Function  Level of Independence Independent  Communication  Communication No difficulties  Pain Assessment  Pain Assessment No/denies pain  Cognition  Arousal/Alertness Awake/alert  Behavior During Therapy WFL for tasks assessed/performed  Overall Cognitive Status Within Functional Limits for tasks assessed  Upper Extremity Assessment  Upper Extremity Assessment Overall WFL for tasks assessed  Lower Extremity Assessment  Lower Extremity Assessment Defer to PT evaluation  ADL  Overall ADL's  Needs assistance/impaired  Grooming Wash/dry face;Set up;Sitting  Upper Body Bathing Set up;Sitting  Lower Body Bathing Supervison/ safety;Sit to/from stand  Upper Body Dressing  Supervision/safety;Sitting  Lower Body Dressing  Supervision/safety;Sit to/from Arboriculturist;Ambulation  Toilet Transfer Details (indicate cue type and reason) for safety, cue to hold grab bar due to low surface  Toileting- Clothing Manipulation and Hygiene Supervision/safety;Sit to/from stand  Functional mobility during ADLs Supervision/safety  Bed Mobility  Overal bed mobility Independent  Transfers  Overall transfer level Needs assistance  Equipment used None  Transfers Sit to/from Stand  Sit to Stand Supervision  General transfer comment for safety, mild unsteadiness 1x  Balance  Overall balance assessment Mild deficits observed, not formally tested  General Comments  General comments (skin integrity, edema, etc.) brief desaturation to 89% on RA with functional ambulation however quickly recovers to 92-93%  OT - End of Session  Activity Tolerance Patient tolerated treatment well  Patient left in chair;with call bell/phone within reach  Nurse Communication Mobility status  OT Assessment  OT Recommendation/Assessment Patient needs continued OT Services  OT Visit Diagnosis Other abnormalities of gait and mobility (R26.89)  OT Problem List Decreased activity tolerance;Impaired balance (sitting and/or standing)  OT Plan  OT Frequency (ACUTE ONLY) Min 2X/week  OT Treatment/Interventions (ACUTE ONLY) Self-care/ADL training;Energy conservation;Therapeutic activities;Patient/family education;Balance training  AM-PAC OT "6 Clicks" Daily Activity Outcome Measure (Version 2)  Help from another person eating meals? 4  Help from another person taking care of personal grooming? 3  Help from another person toileting, which includes using toliet, bedpan, or urinal? 3  Help from another person bathing (including washing, rinsing, drying)? 3  Help from another person to put on and taking off regular upper body clothing? 3  Help from another person to put on and taking off regular lower body clothing? 3  6 Click  Score 19  OT Recommendation  Follow Up Recommendations No OT follow  up  OT Equipment None recommended by OT  Individuals Consulted  Consulted and Agree with Results and Recommendations Patient  Acute Rehab OT Goals  Patient Stated Goal to feel better, get back to my work outside  OT Goal Formulation With patient  Time For Goal Achievement 09/21/20  Potential to Achieve Goals Good  OT Time Calculation  OT Start Time (ACUTE ONLY) 0819  OT Stop Time (ACUTE ONLY) 0850  OT Time Calculation (min) 31 min  OT General Charges  $OT Visit 1 Visit  OT Evaluation  $OT Eval Moderate Complexity 1 Mod  Written Expression  Dominant Hand Right   Delbert Phenix OT OT pager: [phone]

## 2020-09-07 NOTE — Progress Notes (Addendum)
[facility] for Lovenox > Eliquis Indication: atrial fibrillation  Allergies  Allergen Reactions  . Wellbutrin [Bupropion Hcl] Rash    Patient Measurements: Height: 6\' 2"  (188 cm) Weight: 81 kg (178 lb 9.2 oz) IBW/kg (Calculated) : 82.2 Heparin Dosing Weight: 76 kg  Vital Signs: Temp: 97.8 F (36.6 C) (11/02 0740) Temp Source: Oral (11/02 0740) BP: 109/49 (11/02 0800) Pulse Rate: 69 (11/02 0800)  Labs: Recent Labs    09/04/20 1004 09/04/20 2029 09/05/20 0748 09/05/20 0748 09/06/20 0323 09/07/20 0304  HGB  --   --  13.9   < > 13.7 14.0  HCT  --   --  41.4  --  41.9 42.3  PLT  --   --  286  --  273 300  HEPARINUNFRC 0.20* 0.22*  --   --   --   --   CREATININE  --   --  0.94  --  1.06 0.91   < > = values in this interval not displayed.    Estimated Creatinine Clearance: 76.6 mL/min (by C-G formula based on SCr of 0.91 mg/dL).   Medications:  No PTA anticoagulants per med history  Assessment: 79 y/o M admitted with tachycardia. Pharmacy consulted to dose IV UFH for atrial fibrillation.   Today, 09/07/2020:  To transition from lovenox to apixaban  No bleeding or infusion issues noted  Hgb and Plts stable  SCr wnl, CrCl 76 ml/min  Plan to stop aspirin at discharge  Goal of Therapy:  Monitor platelets by anticoagulation protocol: Yes   Plan:   Dc Lovenox   Start Apixaban 5mg  BID thereafter  Patient provided with 30day free coupon voucher and DOAC education prior to discharge  Peggyann Juba, PharmD, [place]: [phone] 09/07/2020 9:35 AM

## 2020-09-07 NOTE — Discharge Instructions (Signed)
Atrial Flutter  Atrial flutter is a type of abnormal heart rhythm (arrhythmia). The heart has an electrical system that tells it how to beat. In atrial flutter, the signals move rapidly in the top chambers of the heart (the atria). This makes your heart beat very fast. Atrial flutter can come and go, or it can be permanent. The goal of treatment is to prevent blood clots from forming, control your heart rate, or restore your heartbeat to a normal rhythm. If this condition is not treated, it can cause serious problems, such as a weakened heart muscle (cardiomyopathy) or a stroke. What are the causes? This condition is often caused by conditions that damage the heart's electrical system. These include:  Heart conditions and heart surgery. These include heart attacks and open-heart surgery.  Lung problems, such as COPD or a blood clot in the lung (pulmonary embolism, or PE).  Poorly controlled high blood pressure (hypertension).  Overactive thyroid (hyperthyroidism).  Diabetes. In some cases, the cause of this condition is not known. What increases the risk? You are more likely to develop this condition if:  You are an elderly adult.  You are a man.  You are overweight (obese).  You have obstructive sleep apnea.  You have a family history of atrial flutter.  You have diabetes.  You drink a lot of alcohol, especially binge drinking.  You use drugs, including cannabis.  You smoke. What are the signs or symptoms? Symptoms of this condition include:  A feeling that your heart is pounding or racing (palpitations).  Shortness of breath.  Chest pain.  Feeling dizzy or light-headed.  Fainting.  Low blood pressure (hypotension).  Fatigue.  Tiring easily during exercise or activity. In some cases, there are no symptoms. How is this diagnosed? This condition may be diagnosed with:  An electrocardiogram (ECG) to check electrical signals of the heart.  An ambulatory  cardiac monitor to record your heart's activity for a few days.  An echocardiogram to create pictures of your heart.  A transesophageal echocardiogram (TEE) to create even better pictures of your heart.  A stress test to check your blood supply while you exercise.  Imaging tests, such as a CT scan or chest X-ray.  Blood tests. How is this treated? Treatment depends on underlying conditions and how you feel when you experience atrial flutter. This condition may be treated with:  Medicines to prevent blood clots or to treat heart rate or heart rhythm problems.  Electrical cardioversion to reset the heart's rhythm.  Ablation to remove the heart tissue that sends abnormal signals.  Left atrial appendage closure to seal the area where blood clots can form. In some cases, underlying conditions will be treated. Follow these instructions at home: Medicines  Take over-the-counter and prescription medicines only as told by your health care provider.  Do not take any new medicines without talking to your health care provider.  If you are taking blood thinners: ? Talk with your health care provider before you take any medicines that contain aspirin or NSAIDs, such as ibuprofen. These medicines increase your risk for dangerous bleeding. ? Take your medicine exactly as told, at the same time every day. ? Avoid activities that could cause injury or bruising, and follow instructions about how to prevent falls. ? Wear a medical alert bracelet or carry a card that lists what medicines you take. Lifestyle  Eat heart-healthy foods. Talk with a dietitian to make an eating plan that is right for you.  Do  not use any products that contain nicotine or tobacco, such as cigarettes, e-cigarettes, and chewing tobacco. If you need help quitting, ask your health care provider.  Do not drink alcohol.  Do not use drugs, including cannabis.  Lose weight if you are overweight or obese.  Exercise  regularly as instructed by your health care provider. General instructions  Do not use diet pills unless your health care provider approves. Diet pills may make heart problems worse.  If you have obstructive sleep apnea, manage your condition as told by your health care provider.  Keep all follow-up visits as told by your health care provider. This is important. Contact a health care provider if you:  Notice a change in the rate, rhythm, or strength of your heartbeat.  Are taking a blood thinner and you notice more bruising.  Have a sudden change in weight.  Tire more easily when you exercise or do heavy work. Get help right away if you have:  Pain or pressure in your chest.  Shortness of breath.  Fainting.  Increasing sweating with no known cause.  Side effects of blood thinners, such as blood in your vomit, stool, or urine, or bleeding that cannot stop.  Any symptoms of a stroke. "BE FAST" is an easy way to remember the main warning signs of a stroke: ? B - Balance. Signs are dizziness, sudden trouble walking, or loss of balance. ? E - Eyes. Signs are trouble seeing or a sudden change in vision. ? F - Face. Signs are sudden weakness or numbness of the face, or the face or eyelid drooping on one side. ? A - Arms. Signs are weakness or numbness in an arm. This happens suddenly and usually on one side of the body. ? S - Speech. Signs are sudden trouble speaking, slurred speech, or trouble understanding what people say. ? T - Time. Time to call emergency services. Write down what time symptoms started.  Other signs of a stroke, such as: ? A sudden, severe headache with no known cause. ? Nausea or vomiting. ? Seizure.  These symptoms may represent a serious problem that is an emergency. Do not wait to see if the symptoms will go away. Get medical help right away. Call your local emergency services (911 in the [place] Do not drive yourself to the hospital. Summary  Atrial  flutter is an abnormal heart rhythm that can give you symptoms of palpitations, shortness of breath, or fatigue.  Atrial flutter is often treated with medicines to keep your heart in a normal rhythm and to prevent a stroke.  Get help right away if you cannot catch your breath, or have chest pain or pressure.  Get help right away if you have signs or symptoms of a stroke. This information is not intended to replace advice given to you by your health care provider. Make sure you discuss any questions you have with your health care provider. Document Revised: 04/16/2019 Document Reviewed: 04/16/2019 Elsevier Patient Education  [place] on my medicine - ELIQUIS (apixaban)  This medication education was reviewed with me or my healthcare representative as part of my discharge preparation.  The pharmacist that spoke with me during my hospital stay was:  Emiliano Dyer, [facility]  Why was Eliquis prescribed for you? Eliquis was prescribed for you to reduce the risk of a blood clot forming that can cause a stroke if you have a medical condition called atrial fibrillation (a type of irregular heartbeat).  What do You need to know about Eliquis ? Take your Eliquis TWICE DAILY - one tablet in the morning and one tablet in the evening with or without food. If you have difficulty swallowing the tablet whole please discuss with your pharmacist how to take the medication safely.  Take Eliquis exactly as prescribed by your doctor and DO NOT stop taking Eliquis without talking to the doctor who prescribed the medication.  Stopping may increase your risk of developing a stroke.  Refill your prescription before you run out.  After discharge, you should have regular check-up appointments with your healthcare provider that is prescribing your Eliquis.  In the future your dose may need to be changed if your kidney function or weight changes by a significant amount or as you get  older.  What do you do if you miss a dose? If you miss a dose, take it as soon as you remember on the same day and resume taking twice daily.  Do not take more than one dose of ELIQUIS at the same time to make up a missed dose.  Important Safety Information A possible side effect of Eliquis is bleeding. You should call your healthcare provider right away if you experience any of the following: ? Bleeding from an injury or your nose that does not stop. ? Unusual colored urine (red or dark brown) or unusual colored stools (red or black). ? Unusual bruising for unknown reasons. ? A serious fall or if you hit your head (even if there is no bleeding).  Some medicines may interact with Eliquis and might increase your risk of bleeding or clotting while on Eliquis. To help avoid this, consult your healthcare provider or pharmacist prior to using any new prescription or non-prescription medications, including herbals, vitamins, non-steroidal anti-inflammatory drugs (NSAIDs) and supplements.  This website has more information on Eliquis (apixaban): http://www.eliquis.com/eliquis/home

## 2020-09-07 NOTE — Telephone Encounter (Signed)
[name] is being discharged from hospital today.

## 2020-09-08 ENCOUNTER — Encounter (HOSPITAL_COMMUNITY): Payer: Self-pay | Admitting: Cardiovascular Disease

## 2020-09-08 NOTE — Telephone Encounter (Signed)
Transition Care Management Follow-up Telephone Call Date of discharge and from where: 09/07/2020 [facility]  How have you been since you were released from the hospital? Getting better  Any questions or concerns? No  Items Reviewed:  Did the pt receive and understand the discharge instructions provided? Yes   Medications obtained and verified? Yes   Other? No   Any new allergies since your discharge? No   Dietary orders reviewed? No  Do you have support at home? Yes   Home Care and Equipment/Supplies: Were home health services ordered? no If so, what is the name of the agency? N/A  Has the agency set up a time to come to the patient's home? no Were any new equipment or medical supplies ordered?  No What is the name of the medical supply agency? N/A Were you able to get the supplies/equipment? not applicable Do you have any questions related to the use of the equipment or supplies? No  Functional Questionnaire: (I = Independent and D = Dependent) ADLs: I  Bathing/Dressing- I   Meal Prep- I   Eating- I  Maintaining continence- I  Transferring/Ambulation- I  Managing Meds- I  Follow up appointments reviewed:   PCP Hospital f/u appt confirmed?He said he will call back to book appt.   [facility] f/u appt confirmed? Yes  Scheduled to see pulmonologist on 11/15 @ 2:00pm.  Are transportation arrangements needed? Yes   If their condition worsens, is the pt aware to call PCP or go to the Emergency Dept.? Yes  Was the patient provided with contact information for the PCP's office or ED? Yes  Was to pt encouraged to call back with questions or concerns? Yes

## 2020-09-09 LAB — CULTURE, RESPIRATORY W GRAM STAIN: Culture: NORMAL

## 2020-09-09 NOTE — Anesthesia Postprocedure Evaluation (Signed)
Anesthesia Post Note  Patient: [name]  Procedure(s) Performed: TRANSESOPHAGEAL ECHOCARDIOGRAM (TEE) (N/A ) CARDIOVERSION (N/A ) BUBBLE STUDY     Patient location during evaluation: Endoscopy Anesthesia Type: MAC Level of consciousness: awake and alert Pain management: pain level controlled Vital Signs Assessment: post-procedure vital signs reviewed and stable Respiratory status: spontaneous breathing, nonlabored ventilation, respiratory function stable and patient connected to nasal cannula oxygen Cardiovascular status: blood pressure returned to baseline and stable Postop Assessment: no apparent nausea or vomiting Anesthetic complications: no   No complications documented.  Last Vitals:  Vitals:   09/07/20 1500 09/07/20 1600  BP: (!) 115/52 (!) 127/50  Pulse: 82 81  Resp: (!) 24 (!) 23  Temp:    SpO2:      Last Pain:  Vitals:   09/07/20 1230  TempSrc: Oral  PainSc:                  [place]

## 2020-09-14 ENCOUNTER — Other Ambulatory Visit: Payer: Self-pay

## 2020-09-14 ENCOUNTER — Encounter: Payer: Self-pay | Admitting: Internal Medicine

## 2020-09-14 ENCOUNTER — Ambulatory Visit (INDEPENDENT_AMBULATORY_CARE_PROVIDER_SITE_OTHER): Payer: PPO | Admitting: Internal Medicine

## 2020-09-14 VITALS — BP 110/70 | HR 58 | Temp 97.9°F | Ht 74.75 in | Wt 169.0 lb

## 2020-09-14 DIAGNOSIS — I4891 Unspecified atrial fibrillation: Secondary | ICD-10-CM | POA: Diagnosis not present

## 2020-09-14 DIAGNOSIS — J189 Pneumonia, unspecified organism: Secondary | ICD-10-CM

## 2020-09-14 DIAGNOSIS — J157 Pneumonia due to Mycoplasma pneumoniae: Secondary | ICD-10-CM | POA: Diagnosis not present

## 2020-09-14 NOTE — Progress Notes (Signed)
Subjective:    Patient ID: [name], male    DOB: July 10, 1941, 79 y.o.   MRN: [id number]  HPI 79 year old Male was hospitalized October 29 through November 2 after presenting to ED with fatigue and SOB. Has not been vaccinated for Covid-19. Went to an urgent care facility on the day of admission with flu like symptoms and was told to go to ED. In ED, heart rate was 140. CTA negative for PE but pt had bilateral pleural effusions and bi-apical ground glass appearance of upper lobes. Thought to have atypical pneumonia.He was tested for Legionnaire's disease but test was negative. Sputum showed few gram-negative rods, moderate gram-positive cocci in pairs and clusters and rare gram-positive rods. He received pneumococcal 23 and Prevnar 13 in 2012 and 2018 respectively. Apparently had flu vaccine in January 2021.  Patient was placed on a Cardizem drip. He was seen by Dr. [name], Cardiologist. Patient was maintained on IV heparin and subsequently Lovenox. He had a TEE conversion by Dr. [name] on November 1 which was successful. Patient subsequently remained in sinus rhythm. Lovenox was transitioned to Eliquis. He was treated with IV cefepime, azithromycin and Mucinex for pneumonia. He was seen by pulmonary consultant who recommended follow-up CT in 1 to 2 months as an outpatient. He was discharged home on Omnicef and azithromycin. His BNP was normal. He was treated with Lasix IV during his hospitalization. It was not felt that thoracentesis was needed. Patient improved and was discharged home on low-dose Lasix 20 mg daily with appointments made for follow-up with cardiology and pulmonology. Was found to have mild to moderate mitral valvular regurgitation and severe tricuspid valvular regurgitation per 2D echocardiogram.    Review of Systems  HENT: Negative.   Respiratory:       Denies shortness of breath  Cardiovascular:       Denies chest pain  Gastrointestinal: Negative.   Genitourinary:  Negative.   Neurological: Negative.   Psychiatric/Behavioral: Negative.    see above-patient feeling much better now. Less shortness of breath. Gaining strength every day. Has follow-up with cardiologist soon.     Objective:   Physical Exam Thin white male in no acute distress. Blood pressure 110/70 respiratory rate 12 pulse 58 regular temperature 97.9 degrees pulse oximetry 98% weight 169 pounds BMI 21.27 Skin is warm and dry. No cervical adenopathy. No carotid bruits. Chest is clear to auscultation without rales or wheezing. Cardiac exam regular rate and rhythm. No lower extremity edema. Affect is normal. No lower extremity pitting edema.      Assessment & Plan:  Patient admitted with bilateral pleural effusions and biapical infiltrates consistent with atypical pneumonia  He was in atrial fibrillation with rapid ventricular response at the time of his presentation to the emergency department. He was treated with IV cefepime and azithromycin. He was discharged home on Omnicef and azithromycin.  Patient denies shortness of breath. His pulse oximetry is excellent at 98% on room air.  He has follow-up for atrial fibrillation with Dr. [name]. He has follow-up with Pulmonology. Today I drew titers for mycoplasma pneumonia since he was described as having atypical pneumonia by radiologist. Also drew titers for Epstein-Barr virus.  Plan: I will see him again in 4 weeks and in the interim he will see cardiologist and pulmonologist.  Addendum: Mycoplasma pneumoniae titers reveal positive IgG at 0.95 and positive IgM is 802 normal being less than 770. I think this indicates he has an acute Mycoplasma pneumonia. His IgM Epstein-Barr  virus titer was less than 36 but it would appear he has had Epstein-Barr virus in the past as he has positive titer for IgG.  He resides alone. Says he is able to prepare food and is feeling much better. Denies shortness of breath. Some slight fatigue but that is  improving.

## 2020-09-15 DIAGNOSIS — J918 Pleural effusion in other conditions classified elsewhere: Secondary | ICD-10-CM | POA: Diagnosis not present

## 2020-09-15 DIAGNOSIS — E7849 Other hyperlipidemia: Secondary | ICD-10-CM | POA: Diagnosis not present

## 2020-09-15 DIAGNOSIS — I483 Typical atrial flutter: Secondary | ICD-10-CM | POA: Diagnosis not present

## 2020-09-15 DIAGNOSIS — J168 Pneumonia due to other specified infectious organisms: Secondary | ICD-10-CM | POA: Diagnosis not present

## 2020-09-20 ENCOUNTER — Encounter: Payer: Self-pay | Admitting: Pulmonary Disease

## 2020-09-20 ENCOUNTER — Ambulatory Visit: Payer: PPO | Admitting: Pulmonary Disease

## 2020-09-20 ENCOUNTER — Ambulatory Visit (INDEPENDENT_AMBULATORY_CARE_PROVIDER_SITE_OTHER): Payer: PPO

## 2020-09-20 ENCOUNTER — Other Ambulatory Visit: Payer: Self-pay

## 2020-09-20 VITALS — BP 126/74 | HR 65 | Temp 98.7°F | Ht 74.0 in | Wt 170.4 lb

## 2020-09-20 DIAGNOSIS — R918 Other nonspecific abnormal finding of lung field: Secondary | ICD-10-CM | POA: Insufficient documentation

## 2020-09-20 DIAGNOSIS — J90 Pleural effusion, not elsewhere classified: Secondary | ICD-10-CM | POA: Diagnosis not present

## 2020-09-20 DIAGNOSIS — R931 Abnormal findings on diagnostic imaging of heart and coronary circulation: Secondary | ICD-10-CM

## 2020-09-20 DIAGNOSIS — Z0000 Encounter for general adult medical examination without abnormal findings: Secondary | ICD-10-CM | POA: Insufficient documentation

## 2020-09-20 DIAGNOSIS — I4892 Unspecified atrial flutter: Secondary | ICD-10-CM

## 2020-09-20 DIAGNOSIS — I071 Rheumatic tricuspid insufficiency: Secondary | ICD-10-CM | POA: Diagnosis not present

## 2020-09-20 DIAGNOSIS — I340 Nonrheumatic mitral (valve) insufficiency: Secondary | ICD-10-CM | POA: Diagnosis not present

## 2020-09-20 NOTE — Assessment & Plan Note (Signed)
Clear breath sounds to auscultation today Patient with no acute symptoms Patient reports adherence to Lasix  Plan: Chest x-ray today Follow-up in 8 to 10 weeks

## 2020-09-20 NOTE — Assessment & Plan Note (Signed)
Appears to be in regular rhythm today  Plan: Continue Eliquis Continue current medications Referral to cardiology

## 2020-09-20 NOTE — Assessment & Plan Note (Signed)
Plan: Would recommend patient obtain seasonal flu vaccine in fall/2021 Would encourage patient to obtain COVID-19 vaccination

## 2020-09-20 NOTE — Assessment & Plan Note (Signed)
Plan: Referral to cardiology

## 2020-09-20 NOTE — Assessment & Plan Note (Signed)
Sputum cultures negative Recommend a 1 to 41-month follow-up CT chest at discharge from the hospital  Plan: Chest x-ray today CT chest without contrast in 8 weeks Follow-up in office in 10 weeks

## 2020-09-20 NOTE — Progress Notes (Signed)
@Patient  ID: [name], male    DOB: 16-Aug-1941, 79 y.o.   MRN: [id number]  Chief Complaint  Patient presents with  . Hospitalization Follow-up    Referring provider: [name], MD  HPI:  79 year old male former smoker followed in our office for abnormal CT as well as pleural effusions  PMH: A. fib, anxiety, hyperlipidemia, GERD, insomnia Smoker/ Smoking History: Former smoker Maintenance: None Pt of: Needs outpatient pulmonology follow-up/saw Dr. [name] in the hospital  09/20/2020  - Visit   79 year old male former smoker initially consulted with our practice when hospitalized on October/20/2021.  Originally consulted with our practice on 09/04/2020 with Dr. [name].  Patient with abnormal CT chest suspicious for atypical infection.  He was suspected that abnormal CT may be pulmonary edema given patient's A. fib with RVR.  Echo showed strong LV function.  Dilated left and right atrium, moderate mitral regurg and severe tricuspid regurg.  Patient was scheduled for follow-up with our office today.  Patient will need follow-up chest x-ray imaging.  Also cultures will continue to grow out in lab.  Continue follow-up with cardiology.  Will need follow-up CT chest in 1 to 2 months as outpatient.  Patient presenting to office today as a follow-up.  He reports that overall has been doing quite well since being discharged from the hospital.  He feels that every day he is slowly improving his strength is coming back.  He is seeing his primary care provider.  He reports that his PCP encouraged him to establish with a new cardiologist.  He is unsure if a referral was placed.  We will discuss this today.  Patient remains adherent to his medications.  He is unvaccinated to COVID-19 as well as seasonal flu vaccine we will discuss this today.  \  Questionaires / Pulmonary Flowsheets:   ACT:  No flowsheet data found.  MMRC: No flowsheet data found.  Epworth:  No flowsheet data  found.  Tests:   09/03/2020-CT angio-no PE, moderate to large bilateral dependent bland appearing pleural effusions with associated bilateral compressive atelectasis, superimposed biapical pulmonary infiltrates and associated bronchial wall thickening suspicious for changes of atypical infection  09/07/2020-chest x-ray-persistent bilateral upper lobe pulmonary infiltrates, persistent bibasilar atelectasis and bilateral pleural effusions, chest is unchanged  09/04/2020-echocardiogram-LV ejection fraction 60 to 65%, LV is normal function, RV systolic function normal, right ventricular size normal, normal pulmonary artery systolic pressure, left atrial size moderately dilated, right atrial size severely dilated, mitral valve is degenerative, mild to moderate mitral valve regurgitation, tricuspid valve regurgitation is severe   FENO:  No results found for: NITRICOXIDE  PFT: No flowsheet data found.  WALK:  No flowsheet data found.  Imaging: DG Chest 2 View  Result Date: 09/03/2020 CLINICAL DATA:  Short of breath EXAM: CHEST - 2 VIEW COMPARISON:  01/17/1999 21 FINDINGS: Small bilateral pleural effusions. No consolidation. Normal heart size. No pneumothorax. IMPRESSION: Small bilateral pleural effusions. Electronically Signed   By: Donavan Foil M.D.   On: 09/03/2020 17:29   CT Angio Chest PE W and/or Wo Contrast  Result Date: 09/03/2020 CLINICAL DATA:  Tachycardia, dyspnea EXAM: CT ANGIOGRAPHY CHEST WITH CONTRAST TECHNIQUE: Multidetector CT imaging of the chest was performed using the standard protocol during bolus administration of intravenous contrast. Multiplanar CT image reconstructions and MIPs were obtained to evaluate the vascular anatomy. CONTRAST:  138mL OMNIPAQUE IOHEXOL 350 MG/ML SOLN COMPARISON:  None. FINDINGS: Cardiovascular: There is adequate opacification of the pulmonary arterial tree. There is no intraluminal  filling defect identified to suggest acute pulmonary embolism. The  central pulmonary arteries are mildly enlarged in keeping with changes of pulmonary arterial hypertension. Minimal coronary artery calcification. Global cardiac size is within normal limits. The thoracic aorta is of normal caliber. Mild atherosclerotic calcification is seen scattered throughout the thoracic aorta. Mediastinum/Nodes: Thyroid unremarkable. No pathologic thoracic adenopathy. Esophagus unremarkable. Lungs/Pleura: Moderate to large bilateral dependently layering pleural effusions are present with compressive atelectasis of the dependent lungs bilaterally. There is superimposed asymmetric biapical ground-glass pulmonary infiltrate, more severe within the left upper lobe, suspicious for acute infection the appropriate clinical setting. There is associated bronchial wall thickening asymmetrically involving the upper lobes bilaterally in keeping with airway inflammation. A a 13 mm intrapleural pulmonary nodule may represent a enlarged intrapulmonary lymph node. No central obstructing lesion. No pneumothorax. Upper Abdomen: No acute abnormality. Musculoskeletal: No lytic or blastic bone lesions. No acute bone abnormality. Review of the MIP images confirms the above findings. IMPRESSION: No pulmonary embolism. Moderate to large bilateral dependent, bland appearing pleural effusions with associated bilateral compressive atelectasis. Superimposed biapical pulmonary infiltrates and associated bronchial wall thickening suspicious for changes of atypical infection in the appropriate clinical setting. Aortic aneurysm NOS (ICD10-I71.9). Electronically Signed   By: Fidela Salisbury MD   On: 09/03/2020 19:38   DG CHEST PORT 1 VIEW  Result Date: 09/07/2020 CLINICAL DATA:  Hypoxia. EXAM: PORTABLE CHEST 1 VIEW COMPARISON:  09/05/2020.  CT 09/03/2020. FINDINGS: Heart size stable. Persistent bilateral upper lobe pulmonary infiltrates. Persistent bibasilar atelectasis and bilateral pleural effusions. Chest is unchanged  from prior exam. No pneumothorax. Biapical pleural thickening consistent scarring. IMPRESSION: Persistent bilateral upper lobe pulmonary infiltrates. Persistent bibasilar atelectasis and bilateral pleural effusions. Chest is unchanged from prior exam. Electronically Signed   By: Marcello Moores  Register   On: 09/07/2020 06:04   DG CHEST PORT 1 VIEW  Result Date: 09/05/2020 CLINICAL DATA:  Short of breath. EXAM: PORTABLE CHEST 1 VIEW COMPARISON:  09/03/2020 FINDINGS: Airspace opacities in the upper and lower lungs bilaterally have increased from the prior exam. Bilateral pleural effusions likely increased in size. Lungs remain hyperexpanded. No pneumothorax. IMPRESSION: 1. Worsening lung aeration compared to the prior study. Increased airspace lung opacities noted in the upper and lower lungs consistent with multifocal pneumonia. Mild increase in bilateral pleural effusions. Electronically Signed   By: Lajean Manes M.D.   On: 09/05/2020 10:27   ECHOCARDIOGRAM COMPLETE  Result Date: 09/04/2020    ECHOCARDIOGRAM REPORT   Patient Name:   [name] Date of Exam: 09/04/2020 Medical Rec #:  [id number]       Height:       74.0 in Accession #:    [id number]      Weight:       178.4 lb Date of Birth:  1941-10-19        BSA:          2.070 m Patient Age:    57 years        BP:           108/50 mmHg Patient Gender: M               HR:           78 bpm. Exam Location:  Inpatient Procedure: 2D Echo Indications:     Atrial Flutter I48.92  History:         Patient has prior history of Echocardiogram examinations, most  recent 01/17/2020. Risk Factors:Dyslipidemia.  Sonographer:     Mikki Santee RDCS (AE) Referring Phys:  [place] Diagnosing Phys: Dixie Dials MD IMPRESSIONS  1. Left ventricular ejection fraction, by estimation, is 60 to 65%. The left ventricle has normal function. The left ventricle has no regional wall motion abnormalities. Left ventricular diastolic parameters are  indeterminate.  2. Right ventricular systolic function is normal. The right ventricular size is normal. There is normal pulmonary artery systolic pressure.  3. Left atrial size was moderately dilated.  4. Right atrial size was severely dilated.  5. The mitral valve is degenerative. Mild to moderate mitral valve regurgitation.  6. Tricuspid valve regurgitation is severe.  7. The aortic valve is bicuspid. There is mild calcification of the aortic valve. There is mild thickening of the aortic valve. Aortic valve regurgitation is not visualized. Mild aortic valve sclerosis is present, with no evidence of aortic valve stenosis.  8. The inferior vena cava is dilated in size with <50% respiratory variability, suggesting right atrial pressure of 15 mmHg. FINDINGS  Left Ventricle: Left ventricular ejection fraction, by estimation, is 60 to 65%. The left ventricle has normal function. The left ventricle has no regional wall motion abnormalities. The left ventricular internal cavity size was normal in size. There is  no left ventricular hypertrophy. Left ventricular diastolic parameters are indeterminate. Right Ventricle: The right ventricular size is normal. No increase in right ventricular wall thickness. Right ventricular systolic function is normal. There is normal pulmonary artery systolic pressure. The tricuspid regurgitant velocity is 2.40 m/s, and  with an assumed right atrial pressure of 8 mmHg, the estimated right ventricular systolic pressure is 29.7 mmHg. Left Atrium: Left atrial size was moderately dilated. Right Atrium: Right atrial size was severely dilated. Pericardium: There is no evidence of pericardial effusion. Mitral Valve: The mitral valve is degenerative in appearance. There is mild thickening of the mitral valve leaflet(s). There is mild calcification of the mitral valve leaflet(s). Normal mobility of the mitral valve leaflets. Mild mitral annular calcification. Mild to moderate mitral valve  regurgitation. Tricuspid Valve: The tricuspid valve is normal in structure. Tricuspid valve regurgitation is severe. Aortic Valve: The aortic valve is bicuspid. There is mild calcification of the aortic valve. There is mild thickening of the aortic valve. There is mild aortic valve annular calcification. Aortic valve regurgitation is not visualized. Mild aortic valve sclerosis is present, with no evidence of aortic valve stenosis. Pulmonic Valve: The pulmonic valve was normal in structure. Pulmonic valve regurgitation is not visualized. Aorta: The aortic root is normal in size and structure. There is minimal (Grade I) atheroma plaque involving the aortic root, ascending aorta and descending aorta. Venous: The inferior vena cava is dilated in size with less than 50% respiratory variability, suggesting right atrial pressure of 15 mmHg. IAS/Shunts: The atrial septum is grossly normal.  LEFT VENTRICLE PLAX 2D LVIDd:         4.70 cm LVIDs:         3.10 cm LV PW:         1.00 cm LV IVS:        1.00 cm LVOT diam:     2.40 cm LV SV:         67 LV SV Index:   32 LVOT Area:     4.52 cm  RIGHT VENTRICLE RV S prime:     11.70 cm/s TAPSE (M-mode): 1.5 cm LEFT ATRIUM  Index       RIGHT ATRIUM           Index LA diam:        3.60 cm  1.74 cm/m  RA Area:     19.80 cm LA Vol (A2C):   111.0 ml 53.62 ml/m RA Volume:   65.90 ml  31.83 ml/m LA Vol (A4C):   59.0 ml  28.50 ml/m LA Biplane Vol: 82.5 ml  39.85 ml/m  AORTIC VALVE LVOT Vmax:   72.15 cm/s LVOT Vmean:  51.950 cm/s LVOT VTI:    0.149 m  AORTA Ao Root diam: 4.10 cm TRICUSPID VALVE TR Peak grad:   23.0 mmHg TR Vmax:        240.00 cm/s  SHUNTS Systemic VTI:  0.15 m Systemic Diam: 2.40 cm Dixie Dials MD Electronically signed by Dixie Dials MD Signature Date/Time: 09/04/2020/3:17:22 PM    Final    ECHO TEE  Result Date: 09/06/2020    TRANSESOPHOGEAL ECHO REPORT   Patient Name:   [name] Date of Exam: 09/06/2020 Medical Rec #:  [id number]       Height:        74.0 in Accession #:    [id number]      Weight:       178.4 lb Date of Birth:  09/13/1941        BSA:          2.070 m Patient Age:    29 years        BP:           149/62 mmHg Patient Gender: M               HR:           104 bpm. Exam Location:  Inpatient Procedure: Transesophageal Echo, Cardiac Doppler, Color Doppler and Saline            Contrast Bubble Study Indications:     Aflutter  History:         Patient has prior history of Echocardiogram examinations, most                  recent 09/04/2020. CHF, TIA, Arrythmias:Atrial Flutter,                  Signs/Symptoms:pleural effusion; Risk Factors:Dyslipidemia.  Sonographer:     Dustin Flock RDCS Referring Phys:  [place] Diagnosing Phys: Dixie Dials MD PROCEDURE: The transesophogeal probe was passed without difficulty through the esophogus of the patient. Sedation performed by different physician. The patient was monitored while under deep sedation. Anesthestetic sedation was provided intravenously by Anesthesiology: 304.27mg  of Propofol. Image quality was good. The patient developed no complications during the procedure. A successful direct current cardioversion was performed at 120 joules with 1 attempt. IMPRESSIONS  1. Left ventricular ejection fraction, by estimation, is 55 to 60%. The left ventricle has normal function. Left ventricular diastolic function could not be evaluated.  2. Right ventricular systolic function is normal. The right ventricular size is normal.  3. Left atrial size was moderately dilated. No left atrial/left atrial appendage thrombus was detected.  4. Right atrial size was moderately dilated.  5. The pericardial effusion is posterior to the left ventricle.  6. The mitral valve is normal in structure. Mild to moderate mitral valve regurgitation. No evidence of mitral stenosis.  7. Tricuspid valve regurgitation is moderate.  8. The aortic valve is tricuspid. There is mild calcification of the aortic valve.  There is  mild thickening of the aortic valve. Aortic valve regurgitation is mild. Mild aortic valve sclerosis is present, with no evidence of aortic valve stenosis.  9. There is Moderate (Grade III) atheroma plaque involving the aortic root, ascending aorta, transverse aorta and descending aorta. 10. The inferior vena cava is normal in size with <50% respiratory variability, suggesting right atrial pressure of 8 mmHg. FINDINGS  Left Ventricle: Left ventricular ejection fraction, by estimation, is 55 to 60%. The left ventricle has normal function. The left ventricular internal cavity size was normal in size. There is no left ventricular hypertrophy. Left ventricular diastolic function could not be evaluated. Right Ventricle: The right ventricular size is normal. No increase in right ventricular wall thickness. Right ventricular systolic function is normal. Left Atrium: Left atrial size was moderately dilated. Spontaneous echo contrast was present in the left atrium. No left atrial/left atrial appendage thrombus was detected. Right Atrium: Right atrial size was moderately dilated. Pericardium: Trivial pericardial effusion is present. The pericardial effusion is posterior to the left ventricle. Mitral Valve: The mitral valve is normal in structure. There is mild thickening of the mitral valve leaflet(s). There is mild calcification of the mitral valve leaflet(s). Mild mitral annular calcification. Mild to moderate mitral valve regurgitation, with centrally-directed jet. No evidence of mitral valve stenosis. Tricuspid Valve: The tricuspid valve is normal in structure. Tricuspid valve regurgitation is moderate . No evidence of tricuspid stenosis. Aortic Valve: The aortic valve is tricuspid. There is mild calcification of the aortic valve. There is mild thickening of the aortic valve. There is mild aortic valve annular calcification. Aortic valve regurgitation is mild. Mild aortic valve sclerosis is present, with no evidence of  aortic valve stenosis. Aortic valve peak gradient measures 4.9 mmHg. Pulmonic Valve: The pulmonic valve was normal in structure. Pulmonic valve regurgitation is trivial. No evidence of pulmonic stenosis. Aorta: The aortic root is normal in size and structure. There is moderate (Grade III) atheroma plaque involving the aortic root, ascending aorta, transverse aorta and descending aorta. Venous: The left upper pulmonary vein, left lower pulmonary vein, right upper pulmonary vein and right lower pulmonary vein are normal. The inferior vena cava is normal in size with less than 50% respiratory variability, suggesting right atrial pressure of 8 mmHg. IAS/Shunts: No atrial level shunt detected by color flow Doppler. Agitated saline contrast was given intravenously to evaluate for intracardiac shunting. There is no evidence of a patent foramen ovale.  AORTIC VALVE AV Vmax:      111.00 cm/s AV Peak Grad: 4.9 mmHg Dixie Dials MD Electronically signed by Dixie Dials MD Signature Date/Time: 09/06/2020/11:08:50 PM    Final     Lab Results:  CBC    Component Value Date/Time   WBC 5.3 09/07/2020 0304   RBC 4.73 09/07/2020 0304   HGB 14.0 09/07/2020 0304   HGB 14.8 05/03/2018 1030   HCT 42.3 09/07/2020 0304   HCT 43.2 05/03/2018 1030   PLT 300 09/07/2020 0304   PLT 286 05/03/2018 1030   MCV 89.4 09/07/2020 0304   MCV 87 05/03/2018 1030   MCH 29.6 09/07/2020 0304   MCHC 33.1 09/07/2020 0304   RDW 13.0 09/07/2020 0304   RDW 14.2 05/03/2018 1030   LYMPHSABS 0.6 (L) 09/07/2020 0304   LYMPHSABS 1.4 05/03/2018 1030   MONOABS 0.3 09/07/2020 0304   EOSABS 0.0 09/07/2020 0304   EOSABS 0.2 05/03/2018 1030   BASOSABS 0.0 09/07/2020 0304   BASOSABS 0.0 05/03/2018 1030    BMET  Component Value Date/Time   NA 137 09/07/2020 0304   NA 143 05/03/2018 1030   K 4.2 09/07/2020 0304   CL 104 09/07/2020 0304   CO2 24 09/07/2020 0304   GLUCOSE 136 (H) 09/07/2020 0304   BUN 24 (H) 09/07/2020 0304   BUN 16  05/03/2018 1030   CREATININE 0.91 09/07/2020 0304   CREATININE 0.79 12/18/2019 0959   CALCIUM 8.8 (L) 09/07/2020 0304   GFRNONAA >60 09/07/2020 0304   GFRNONAA 87 12/18/2019 0959   GFRAA 100 12/18/2019 0959    BNP    Component Value Date/Time   BNP 69.3 09/05/2020 0748    ProBNP No results found for: PROBNP  Specialty Problems      Pulmonary Problems   Atypical pneumonia   Pleural effusion      Allergies  Allergen Reactions  . Wellbutrin [Bupropion Hcl] Rash    Immunization History  Administered Date(s) Administered  . Influenza Split 09/21/2011, 11/04/2012  . Influenza, Seasonal, Injecte, Preservative Fre 11/07/2019  . Influenza,inj,Quad PF,6+ Mos 09/14/2014, 09/02/2015, 08/21/2016, 09/06/2017, 10/16/2018, 10/13/2019  . Pneumococcal Conjugate-13 02/07/2017  . Pneumococcal Polysaccharide-23 10/20/2011  . Tdap 08/03/1998, 12/11/2013    Past Medical History:  Diagnosis Date  . Allergy    seasonal  . BPH (benign prostatic hypertrophy)   . Cataract    bilateral-removed "back in 90's"  . Eczema   . Hyperlipidemia   . Peyronie's disease   . Retinal tear 2000's   bilateral-surgery to repair.  Marland Kitchen Urticaria     Tobacco History: Social History   Tobacco Use  Smoking Status Former Smoker  . Years: 20.00  . Types: Cigarettes  . Quit date: 11/06/1970  . Years since quitting: 49.9  Smokeless Tobacco Never Used   Counseling given: Not Answered   Continue to not smoke  Outpatient Encounter Medications as of 09/20/2020  Medication Sig  . apixaban (ELIQUIS) 5 MG TABS tablet Take 1 tablet (5 mg total) by mouth 2 (two) times daily.  . digoxin (LANOXIN) 0.125 MG tablet Take 1 tablet (0.125 mg total) by mouth daily.  Marland Kitchen diltiazem (CARDIZEM CD) 300 MG 24 hr capsule Take 1 capsule (300 mg total) by mouth daily.  . famotidine (PEPCID) 20 MG tablet Take 1 tablet (20 mg total) by mouth daily.  . furosemide (LASIX) 20 MG tablet Take 1 tablet (20 mg total) by mouth daily.   . metoprolol succinate (TOPROL-XL) 100 MG 24 hr tablet Take 1 tablet (100 mg total) by mouth daily.  . rosuvastatin (CRESTOR) 5 MG tablet Take 1 tablet (5 mg total) by mouth at bedtime. (Patient taking differently: Take 5 mg by mouth daily. )  . zolpidem (AMBIEN) 10 MG tablet Take 1 tablet (10 mg total) by mouth at bedtime as needed for sleep.   No facility-administered encounter medications on file as of 09/20/2020.     Review of Systems  Review of Systems  Constitutional: Negative for activity change, chills, fatigue, fever and unexpected weight change.  HENT: Negative for postnasal drip, rhinorrhea, sinus pressure, sinus pain and sore throat.   Eyes: Negative.   Respiratory: Negative for cough, shortness of breath and wheezing.   Cardiovascular: Negative for chest pain and palpitations.  Gastrointestinal: Negative for constipation, diarrhea, nausea and vomiting.  Endocrine: Negative.   Genitourinary: Negative.   Musculoskeletal: Negative.   Skin: Negative.   Neurological: Negative for dizziness and headaches.  Psychiatric/Behavioral: Negative.  Negative for dysphoric mood. The patient is not nervous/anxious.   All other systems reviewed and  are negative.    Physical Exam  BP 126/74 (BP Location: Left Arm, Cuff Size: Normal)   Pulse 65   Temp 98.7 F (37.1 C) (Oral)   Ht 6\' 2"  (1.88 m)   Wt 170 lb 6.4 oz (77.3 kg)   SpO2 97%   BMI 21.88 kg/m   Wt Readings from Last 5 Encounters:  09/20/20 170 lb 6.4 oz (77.3 kg)  09/14/20 169 lb (76.7 kg)  09/07/20 178 lb 9.2 oz (81 kg)  05/26/20 166 lb (75.3 kg)  05/14/20 167 lb (75.8 kg)    BMI Readings from Last 5 Encounters:  09/20/20 21.88 kg/m  09/14/20 21.27 kg/m  09/07/20 22.93 kg/m  05/26/20 21.31 kg/m  05/14/20 21.44 kg/m     Physical Exam Vitals and nursing note reviewed.  Constitutional:      General: He is not in acute distress.    Appearance: Normal appearance. He is normal weight.  HENT:     Head:  Normocephalic and atraumatic.     Right Ear: Hearing and external ear normal.     Left Ear: Hearing and external ear normal.     Nose: Nose normal. No mucosal edema or rhinorrhea.     Right Turbinates: Not enlarged.     Left Turbinates: Not enlarged.     Mouth/Throat:     Mouth: Mucous membranes are dry.     Pharynx: Oropharynx is clear. No oropharyngeal exudate.  Eyes:     Pupils: Pupils are equal, round, and reactive to light.  Cardiovascular:     Rate and Rhythm: Normal rate and regular rhythm.     Pulses: Normal pulses.     Heart sounds: Normal heart sounds. No murmur heard.   Pulmonary:     Effort: Pulmonary effort is normal.     Breath sounds: Normal breath sounds. No decreased breath sounds, wheezing or rales.  Musculoskeletal:     Cervical back: Normal range of motion.     Right lower leg: No edema.     Left lower leg: No edema.  Lymphadenopathy:     Cervical: No cervical adenopathy.  Skin:    General: Skin is warm and dry.     Capillary Refill: Capillary refill takes less than 2 seconds.     Findings: No erythema or rash.  Neurological:     General: No focal deficit present.     Mental Status: He is alert and oriented to person, place, and time.     Motor: No weakness.     Coordination: Coordination normal.     Gait: Gait is intact. Gait normal.  Psychiatric:        Mood and Affect: Mood normal.        Behavior: Behavior normal. Behavior is cooperative.        Thought Content: Thought content normal.        Judgment: Judgment normal.       Assessment & Plan:   Mild mitral valve regurgitation Plan: Referral to cardiology  New onset atrial flutter ([place]) Appears to be in regular rhythm today  Plan: Continue Eliquis Continue current medications Referral to cardiology  Tricuspid valvular regurgitation Plan: Referral to cardiology  Pleural effusion Clear breath sounds to auscultation today Patient with no acute symptoms Patient reports adherence to  Lasix  Plan: Chest x-ray today Follow-up in 8 to 10 weeks  Abnormal echocardiogram Plan: Referral to cardiology  Abnormal findings on diagnostic imaging of lung Sputum cultures negative Recommend a 1 to 63-month follow-up  CT chest at discharge from the hospital  Plan: Chest x-ray today CT chest without contrast in 8 weeks Follow-up in office in 10 weeks  Healthcare maintenance Plan: Would recommend patient obtain seasonal flu vaccine in fall/2021 Would encourage patient to obtain COVID-19 vaccination    Return in about 10 weeks (around 11/29/2020), or if symptoms worsen or fail to improve, for Follow up with Dr. [name], [place], With Chest Xray.   Lauraine Rinne, NP 09/20/2020   This appointment required 35 minutes of patient care (this includes precharting, chart review, review of results, face-to-face care, etc.).

## 2020-09-20 NOTE — Patient Instructions (Addendum)
You were seen today by Lauraine Rinne, NP  for:   1. Pleural effusion  - DG Chest 2 View; Future - CT Chest Wo Contrast; Future  We will order an x-ray today  I will order a CT of your chest in 8 weeks  2. Abnormal findings on diagnostic imaging of lung  - CT Chest Wo Contrast; Future  We will order a CT to be completed in 8 weeks  3. New onset atrial flutter ([place]) 4. Abnormal echocardiogram 5. Tricuspid valve insufficiency 6. Mild mitral valve regurgitation  - Ambulatory referral to Cardiology  Continue your current medications next Continue Eliquis  We have referred you to establish with a cardiologist in the outpatient setting  7. Healthcare maintenance  We recommend that you obtain the seasonal flu vaccine in fall/2021  We recommend you obtain COVID-19 vaccinations   We recommend today:  Orders Placed This Encounter  Procedures  . DG Chest 2 View    Standing Status:   Future    Number of Occurrences:   1    Standing Expiration Date:   01/18/2021    Order Specific Question:   Reason for Exam (SYMPTOM  OR DIAGNOSIS REQUIRED)    Answer:   pleural effusion    Order Specific Question:   Preferred imaging location?    Answer:   Internal    Order Specific Question:   Radiology Contrast Protocol - do NOT remove file path    Answer:   [email or web address]  . CT Chest Wo Contrast    In 8 weeks - around 11/20/2020    Standing Status:   Future    Standing Expiration Date:   09/20/2021    Order Specific Question:   Preferred imaging location?    Answer:   [place]  . Ambulatory referral to Cardiology    Referral Priority:   Routine    Referral Type:   Consultation    Referral Reason:   Specialty Services Required    Requested Specialty:   Cardiology    Number of Visits Requested:   1   Orders Placed This Encounter  Procedures  . DG Chest 2 View  . CT Chest Wo Contrast  . Ambulatory referral to  Cardiology   No orders of the defined types were placed in this encounter.   Follow Up:    Return in about 10 weeks (around 11/29/2020), or if symptoms worsen or fail to improve, for Follow up with Dr. [name], [place], With Chest Xray.   Notification of test results are managed in the following manner: If there are  any recommendations or changes to the  plan of care discussed in office today,  we will contact you and let you know what they are. If you do not hear from [place], then your results are normal and you can view them through your  MyChart account , or a letter will be sent to you. Thank you again for trusting [place] with your care  - Thank you, [facility]    It is flu season:   >>> Best ways to protect herself from the flu: Receive the yearly flu vaccine, practice good hand hygiene washing with soap and also using hand sanitizer when available, eat a nutritious meals, get adequate rest, hydrate appropriately       Please contact the office if your symptoms worsen or you have concerns that you are not improving.   Thank you for choosing [facility]  for your healthcare, and for allowing [place] to partner with you on your healthcare journey. I am thankful to be able to provide care to you today.   Wyn Quaker FNP-C

## 2020-09-21 ENCOUNTER — Other Ambulatory Visit: Payer: Self-pay | Admitting: Pulmonary Disease

## 2020-09-21 LAB — EPSTEIN-BARR VIRUS VCA ANTIBODY PANEL
EBV NA IgG: >600 U/mL — ABNORMAL HIGH
EBV VCA IgG: >750 U/mL — ABNORMAL HIGH
EBV VCA IgM: <36 U/mL

## 2020-09-21 LAB — MYCOPLASMA PNEUMONIAE AB, IGM/IGG
M. pneumoniae Ab, IgG: 0.95 — ABNORMAL HIGH (ref <=0.90)
Mycoplasma pneumo IgM: 802 U/mL — ABNORMAL HIGH (ref <770)

## 2020-10-07 NOTE — Telephone Encounter (Signed)
Can you please sign off, [facility] is requesting a review from provider. I did his med check on 11/02.

## 2020-10-14 ENCOUNTER — Encounter: Payer: Self-pay | Admitting: Internal Medicine

## 2020-10-14 NOTE — Patient Instructions (Addendum)
We are glad you are feeling better. Tests drawn today for Mycoplasma pneumoniae and Randell Patient virus since you were diagnosed with atypical pneumonia. Follow up here in one month. Follow-up with Pulmonary and Dr. [name], cardiologist.

## 2020-10-14 NOTE — Telephone Encounter (Signed)
Hospital follow up visit has been dictated.

## 2020-10-15 ENCOUNTER — Other Ambulatory Visit: Payer: Self-pay

## 2020-10-15 ENCOUNTER — Ambulatory Visit (INDEPENDENT_AMBULATORY_CARE_PROVIDER_SITE_OTHER): Payer: PPO | Admitting: Internal Medicine

## 2020-10-15 ENCOUNTER — Encounter: Payer: Self-pay | Admitting: Internal Medicine

## 2020-10-15 VITALS — BP 130/60 | HR 55 | Ht 74.0 in | Wt 171.0 lb

## 2020-10-15 DIAGNOSIS — R911 Solitary pulmonary nodule: Secondary | ICD-10-CM | POA: Diagnosis not present

## 2020-10-15 DIAGNOSIS — J189 Pneumonia, unspecified organism: Secondary | ICD-10-CM | POA: Diagnosis not present

## 2020-10-15 DIAGNOSIS — Z8679 Personal history of other diseases of the circulatory system: Secondary | ICD-10-CM | POA: Diagnosis not present

## 2020-10-15 DIAGNOSIS — I340 Nonrheumatic mitral (valve) insufficiency: Secondary | ICD-10-CM | POA: Diagnosis not present

## 2020-10-21 ENCOUNTER — Ambulatory Visit: Payer: PPO | Admitting: Interventional Cardiology

## 2020-10-21 ENCOUNTER — Ambulatory Visit: Payer: PPO | Admitting: Cardiology

## 2020-10-21 ENCOUNTER — Other Ambulatory Visit: Payer: Self-pay

## 2020-10-21 ENCOUNTER — Encounter: Payer: Self-pay | Admitting: Interventional Cardiology

## 2020-10-21 VITALS — BP 132/60 | HR 65 | Ht 74.0 in | Wt 172.8 lb

## 2020-10-21 DIAGNOSIS — E782 Mixed hyperlipidemia: Secondary | ICD-10-CM

## 2020-10-21 DIAGNOSIS — I361 Nonrheumatic tricuspid (valve) insufficiency: Secondary | ICD-10-CM

## 2020-10-21 DIAGNOSIS — I4892 Unspecified atrial flutter: Secondary | ICD-10-CM

## 2020-10-21 DIAGNOSIS — I340 Nonrheumatic mitral (valve) insufficiency: Secondary | ICD-10-CM | POA: Diagnosis not present

## 2020-10-21 NOTE — Progress Notes (Signed)
Cardiology Office Note   Date:  10/21/2020   ID:  [name], DOB 1941-05-11, MRN [id number]  PCP:  [name], MD    No chief complaint on file.  Atrial flutter  Wt Readings from Last 3 Encounters:  10/21/20 172 lb 12.8 oz (78.4 kg)  10/15/20 171 lb (77.6 kg)  09/20/20 170 lb 6.4 oz (77.3 kg)       History of Present Illness: [name] is a 79 y.o. male who is being seen today for the evaluation of atrial flutter at the request of Baxley, Cresenciano Lick, MD.  He was admitted to the hospital with atrial flutter in the setting of atypical pneumonia.  He was found to have a pleural effusion.  He spontaneously converted to normal sinus rhythm.  He was seen by Dr. [name] in the hospital.   He was sent home on Eliquis for stroke prevention, along with diltiazem and digoxin.  EF was normal.  Had a pleural effusion which resolved with antibiotics.    He has not had any bleeding problems.  Denies : Chest pain. Dizziness. Leg edema. Nitroglycerin use. Orthopnea. Palpitations. Paroxysmal nocturnal dyspnea. Shortness of breath. Syncope.      Past Medical History:  Diagnosis Date  . Allergy    seasonal  . BPH (benign prostatic hypertrophy)   . Cataract    bilateral-removed "back in 90's"  . Eczema   . Hyperlipidemia   . Peyronie's disease   . Retinal tear 2000's   bilateral-surgery to repair.  Marland Kitchen Urticaria     Past Surgical History:  Procedure Laterality Date  . BUBBLE STUDY  09/06/2020   Procedure: BUBBLE STUDY;  Surgeon: Dixie Dials, MD;  Location: [facility];  Service: Cardiovascular;;  . CARDIOVERSION N/A 09/06/2020   Procedure: CARDIOVERSION;  Surgeon: Dixie Dials, MD;  Location: [facility];  Service: Cardiovascular;  Laterality: N/A;  . COLONOSCOPY    . EYE SURGERY Bilateral 06/08   retinal detachment OS  . EYE SURGERY Bilateral 1994   cataract OU  . TEE WITHOUT CARDIOVERSION N/A 09/06/2020   Procedure: TRANSESOPHAGEAL ECHOCARDIOGRAM (TEE);   Surgeon: Dixie Dials, MD;  Location: [facility] ENDOSCOPY;  Service: Cardiovascular;  Laterality: N/A;  . TONSILLECTOMY       Current Outpatient Medications  Medication Sig Dispense Refill  . apixaban (ELIQUIS) 5 MG TABS tablet Take 1 tablet (5 mg total) by mouth 2 (two) times daily. 60 tablet 1  . digoxin (LANOXIN) 0.125 MG tablet Take 1 tablet (0.125 mg total) by mouth daily. 30 tablet 1  . diltiazem (CARDIZEM CD) 300 MG 24 hr capsule Take 1 capsule (300 mg total) by mouth daily. 30 capsule 1  . famotidine (PEPCID) 20 MG tablet Take 1 tablet (20 mg total) by mouth daily. 30 tablet 4  . furosemide (LASIX) 20 MG tablet Take 1 tablet (20 mg total) by mouth daily. 30 tablet 1  . metoprolol succinate (TOPROL-XL) 100 MG 24 hr tablet Take 1 tablet (100 mg total) by mouth daily. 30 tablet 1  . rosuvastatin (CRESTOR) 5 MG tablet Take 1 tablet (5 mg total) by mouth at bedtime. (Patient taking differently: Take 5 mg by mouth daily.) 30 tablet 11  . zolpidem (AMBIEN) 10 MG tablet Take 1 tablet (10 mg total) by mouth at bedtime as needed for sleep. 15 tablet 0   No current facility-administered medications for this visit.    Allergies:   Wellbutrin [bupropion hcl]    Social History:  The patient  reports that he quit smoking about 49 years ago. His smoking use included cigarettes. He quit after 20.00 years of use. He has never used smokeless tobacco. He reports previous alcohol use. He reports that he does not use drugs.   Family History:  The patient's family history includes Cancer in his father; Colon polyps in his mother; High blood pressure in his brother; Lung cancer in his father; Other in his father.    ROS:  Please see the history of present illness.   Otherwise, review of systems are positive for improved breathing.   All other systems are reviewed and negative.    PHYSICAL EXAM: VS:  BP 132/60   Pulse 65   Ht 6\' 2"  (1.88 m)   Wt 172 lb 12.8 oz (78.4 kg)   SpO2 96%   BMI 22.19 kg/m  ,  BMI Body mass index is 22.19 kg/m. GEN: Well nourished, well developed, in no acute distress  HEENT: normal  Neck: no JVD, carotid bruits, or masses Cardiac: RRR; no murmurs, rubs, or gallops,tr pretibial edema  Respiratory:  clear to auscultation bilaterally, normal work of breathing GI: soft, nontender, nondistended, + BS MS: no deformity or atrophy  Skin: warm and dry, no rash Neuro:  Strength and sensation are intact Psych: euthymic mood, full affect   EKG:   The ekg ordered 11/21 demonstrates NSR   Recent Labs: 09/03/2020: ALT 22 09/04/2020: TSH 2.527 09/05/2020: B Natriuretic Peptide 69.3 09/07/2020: BUN 24; Creatinine, Ser 0.91; Hemoglobin 14.0; Magnesium 2.4; Platelets 300; Potassium 4.2; Sodium 137   Lipid Panel    Component Value Date/Time   CHOL 185 05/06/2020 0929   TRIG 83 05/06/2020 0929   HDL 55 05/06/2020 0929   CHOLHDL 3.4 05/06/2020 0929   VLDL 32 (H) 01/30/2017 1019   LDLCALC 112 (H) 05/06/2020 0929     Other studies Reviewed: Additional studies/ records that were reviewed today with results demonstrating: Hospital records reviewed.  BP controlled at recent visit with Dr. Renold Genta.    ASSESSMENT AND PLAN:  1. Atrial flutter: s/p TEE/DCCV.  He could not feel the fast HR when he went to Urgent care.  Continue Eliquis for stroke prevention.  Stop Digoxin.  Continue Dilt and Toprol.  If he has a recurrence, would have to consider referral to EP for ablation.  2. Hyperlipidemia: Continue 5 mg Crestor daily.  3. May have some degree of HTN as BP controlled on high dose dilt and toprol.  If BP is not high at next visit with Dr. Renold Genta, would have her decrease Toprol to 50 mg daily.  4. I personally reviewed the TEE and TTE and I do not think the tricuspid regurgitation is severe. Some have some MR and TR. No CHF.    Current medicines are reviewed at length with the patient today.  The patient concerns regarding his medicines were addressed.  The following  changes have been made:  No change  Labs/ tests ordered today include:  No orders of the defined types were placed in this encounter.   Recommend 150 minutes/week of aerobic exercise Low fat, low carb, high fiber diet recommended  Disposition:   FU in 6 months   Signed, Larae Grooms, MD  10/21/2020 2:58 PM    [address], [place], [place]  [address] Phone: [phone]; Fax: [phone]

## 2020-10-21 NOTE — Patient Instructions (Signed)
Medication Instructions:  Your physician has recommended you make the following change in your medication: Stop Digoxin  *If you need a refill on your cardiac medications before your next appointment, please call your pharmacy*   Lab Work: none If you have labs (blood work) drawn today and your tests are completely normal, you will receive your results only by: Marland Kitchen MyChart Message (if you have MyChart) OR . A paper copy in the mail If you have any lab test that is abnormal or we need to change your treatment, we will call you to review the results.   Testing/Procedures: none   Follow-Up: At [facility], you and your health needs are our priority.  As part of our continuing mission to provide you with exceptional heart care, we have created designated Provider Care Teams.  These Care Teams include your primary Cardiologist (physician) and Advanced Practice Providers (APPs -  Physician Assistants and Nurse Practitioners) who all work together to provide you with the care you need, when you need it.  We recommend signing up for the patient portal called "MyChart".  Sign up information is provided on this After Visit Summary.  MyChart is used to connect with patients for Virtual Visits (Telemedicine).  Patients are able to view lab/test results, encounter notes, upcoming appointments, etc.  Non-urgent messages can be sent to your provider as well.   To learn more about what you can do with MyChart, go to [email or web address].    Your next appointment:   June 16,2022 at 1:20  The format for your next appointment:   In Person  Provider:   Casandra Doffing, MD   Other Instructions

## 2020-10-21 NOTE — Progress Notes (Signed)
Many thanks for seeing this nice man- long term patient of mine.

## 2020-11-05 ENCOUNTER — Other Ambulatory Visit: Payer: Self-pay | Admitting: Interventional Cardiology

## 2020-11-05 DIAGNOSIS — I4892 Unspecified atrial flutter: Secondary | ICD-10-CM

## 2020-11-08 ENCOUNTER — Other Ambulatory Visit: Payer: Self-pay

## 2020-11-08 MED ORDER — FUROSEMIDE 20 MG PO TABS
20.0000 mg | ORAL_TABLET | Freq: Every day | ORAL | 1 refills | Status: DC
Start: 2020-11-08 — End: 2021-05-02

## 2020-11-08 MED ORDER — DILTIAZEM HCL ER COATED BEADS 300 MG PO CP24
300.0000 mg | ORAL_CAPSULE | Freq: Every day | ORAL | 1 refills | Status: DC
Start: 2020-11-08 — End: 2021-05-02

## 2020-11-08 NOTE — Telephone Encounter (Signed)
Prescription refill request for Eliquis received. Indication:a flutter Last office visit: 10/21/20 Scr: 0.91 Age: 80 Weight: 78kg

## 2020-11-19 ENCOUNTER — Other Ambulatory Visit: Payer: Self-pay

## 2020-11-19 ENCOUNTER — Ambulatory Visit (HOSPITAL_COMMUNITY)
Admission: RE | Admit: 2020-11-19 | Discharge: 2020-11-19 | Disposition: A | Discharge status: Home / Self Care | Payer: PPO | Source: Ambulatory Visit | Attending: Pulmonary Disease | Admitting: Pulmonary Disease

## 2020-11-19 DIAGNOSIS — R059 Cough, unspecified: Secondary | ICD-10-CM | POA: Diagnosis not present

## 2020-11-19 DIAGNOSIS — J479 Bronchiectasis, uncomplicated: Secondary | ICD-10-CM | POA: Diagnosis not present

## 2020-11-19 DIAGNOSIS — J90 Pleural effusion, not elsewhere classified: Secondary | ICD-10-CM | POA: Diagnosis not present

## 2020-11-19 DIAGNOSIS — J984 Other disorders of lung: Secondary | ICD-10-CM | POA: Diagnosis not present

## 2020-11-19 DIAGNOSIS — I2510 Atherosclerotic heart disease of native coronary artery without angina pectoris: Secondary | ICD-10-CM | POA: Diagnosis not present

## 2020-11-19 DIAGNOSIS — R918 Other nonspecific abnormal finding of lung field: Secondary | ICD-10-CM

## 2020-11-24 ENCOUNTER — Encounter: Payer: Self-pay | Admitting: Internal Medicine

## 2020-11-24 NOTE — Patient Instructions (Addendum)
Your health maintenance exam has been booked from February 2022.  You were hospitalized with atypical pneumonia for Mycoplasma pneumoniae. Likely had arrhythmia due to hypoxemia from pneumonia.  Continue Crestor 5 mg daily.  We will follow-up with lipid panel in February.  Please consider vaccination for COVID-19.

## 2020-11-24 NOTE — Progress Notes (Signed)
   Subjective:    Patient ID: [name], male    DOB: Mar 14, 1941, 80 y.o.   MRN: [id number]  HPI 80 year old Male hospitalized October 29 through November 2 with supraventricular tachycardia and atypical pneumonia.    [name] is here for follow up visit. Had post hospital follow up visit November 9.   CTA was negative for PE but patient had bilateral pleural effusions and biapical groundglass appearance of upper lobes.  Was thought to have atypical pneumonia.  [name] was tested for Legionnaire's disease but the test was negative.  Sputum showed few gram-negative rods, moderate gram-positive cocci in pairs and clusters and rare gram-positive rods.  [name] was placed on a Cardizem drip and was seen by Dr. [name], Cardiologist.  [name] was maintained on IV heparin and subsequently Lovenox.  [name] had a TEE conversion by Dr. [name] on November 1 and that was successful.  [name] has remained in sinus rhythm.  Lovenox was transitioned to Eliquis.  [name] was treated with IV cefepime, azithromycin and Mucinex for pneumonia.  Pulmonary consultant recommended follow-up CT in 1 to 2 months as an outpatient.  [name] was discharged home on Omnicef and azithromycin.  His BNP was normal.  [name] was given IV Lasix during his hospitalization and it was not felt that thoracentesis was needed.  [name] was discharged home on low dose Lasix 20 mg daily.  [name] was found to have mild to moderate mitral valvular regurgitation and severe tricuspid regurgitation on 2D echocardiogram.  Since his pneumonia was classified as atypical, I have checked him for Epstein-Barr virus and it showed remote infection.  However, both IgM and IgG Mycoplasma pneumoniae titers were positive.  This is consistent with recovering acute Mycoplasma pneumonia.  [name] is getting his strength back and feeling well.  CT chest without contrast is planned for January 14.  [name] has been taking it easy but walking some.  COVID-19 and influenza immunizations up-to-date.  Previously  vaccinated with Prevnar 13 and pneumococcal 23. Review of Systems no new complaints     Objective:   Physical Exam Blood pressure 130/60 pulse 55 regular pulse oximetry 97% weight 171 pounds.  Skin warm and dry.  No tachypnea.  Neck is supple without JVD or thyromegaly.  Has left carotid bruit.  Chest clear to auscultation.  Cardiac exam: Regular rate and rhythm normal S1 and S2 without murmurs or gallops.  No lower extremity pitting edema.  Affect, thought, and judgment are normal.       Assessment & Plan:  Hospitalization for atypical pneumonia associated with supraventricular tachycardia/atrial flutter. Has Pulmonary follow up.   Supraventricular tachycardia-Status post successful TEE cardioversion.  Is on chronic anticoagulation.  Is also on diltiazem and digoxin at the present time  Hyperlipidemia treated with Crestor 5 mg daily.  Plan: [name] has appointment to see Dr. Irish Lack December 16 regarding atrial flutter which was likely related to his pneumonia.  Has follow-up with Pulmonary in January 2022.  Chest CT is scheduled at that time.  Return here in February 2022 for health maintenance exam.

## 2020-11-29 ENCOUNTER — Encounter: Payer: Self-pay | Admitting: Pulmonary Disease

## 2020-11-29 ENCOUNTER — Other Ambulatory Visit: Payer: Self-pay

## 2020-11-29 ENCOUNTER — Ambulatory Visit: Payer: PPO | Admitting: Pulmonary Disease

## 2020-11-29 VITALS — BP 118/68 | HR 67 | Temp 97.0°F | Ht 74.0 in | Wt 178.2 lb

## 2020-11-29 DIAGNOSIS — R918 Other nonspecific abnormal finding of lung field: Secondary | ICD-10-CM | POA: Diagnosis not present

## 2020-11-29 NOTE — Patient Instructions (Signed)
I am glad you are doing well with improvement in your breathing I have reviewed his CT scan which shows resolution of the pneumonia and fluid around the lung  Apart from chronic scarring and a small stable lung nodule there are no other significant lung issues You can follow-up in pulmonary clinic as needed.

## 2020-11-29 NOTE — Progress Notes (Signed)
[name]    [id number]    July 03, 1941  Primary Care Physician:Baxley, Cresenciano Lick, MD  Referring Physician: [name], MD [address] [place],  [place] [address]  Chief complaint: Follow-up for pneumonia, pleural effusion  HPI: 80 year old with history of BPH, hyperlipidemia, utricaria, TIA He was admitted to the hospital in Nov 2021 with flulike symptoms, fatigue.  Found to be in atrial fibrillation with CTA showing bilateral effusions with groundglass opacities PCCM consulted for help with management.  Recommendation was to treat for pneumonia, diuresis for heart failure and follow-up imaging  Overall he is recovered since his admission.  Has minimal dyspnea on exertion.  No cough, sputum production, fevers, chills  Pets: Has a dog Occupation: Used to work in [employer] Exposures: No mold, hot tub, Customer service manager.  No feather pillows or comforters Smoking history: 15-pack-year smoker.  Quit in 1972 Travel history: No significant travel history Relevant family history: No family history of lung disease   Outpatient Encounter Medications as of 11/29/2020  Medication Sig  . diltiazem (CARDIZEM CD) 300 MG 24 hr capsule Take 1 capsule (300 mg total) by mouth daily.  Marland Kitchen ELIQUIS 5 MG TABS tablet TAKE 1 TABLET BY MOUTH TWICE A DAY  . famotidine (PEPCID) 20 MG tablet Take 1 tablet (20 mg total) by mouth daily.  . furosemide (LASIX) 20 MG tablet Take 1 tablet (20 mg total) by mouth daily.  . metoprolol succinate (TOPROL-XL) 100 MG 24 hr tablet TAKE 1 TABLET BY MOUTH EVERY DAY  . rosuvastatin (CRESTOR) 5 MG tablet Take 1 tablet (5 mg total) by mouth at bedtime. (Patient taking differently: Take 5 mg by mouth daily.)  . [DISCONTINUED] digoxin (LANOXIN) 0.125 MG tablet TAKE 1 TABLET BY MOUTH EVERY DAY   No facility-administered encounter medications on file as of 11/29/2020.    Allergies as of 11/29/2020 - Review Complete 11/29/2020  Allergen Reaction Noted  .  Wellbutrin [bupropion hcl] Rash 06/30/2011    Past Medical History:  Diagnosis Date  . Allergy    seasonal  . BPH (benign prostatic hypertrophy)   . Cataract    bilateral-removed "back in 90's"  . Eczema   . Hyperlipidemia   . Peyronie's disease   . Retinal tear 2000's   bilateral-surgery to repair.  Marland Kitchen Urticaria     Past Surgical History:  Procedure Laterality Date  . BUBBLE STUDY  09/06/2020   Procedure: BUBBLE STUDY;  Surgeon: Dixie Dials, MD;  Location: [place];  Service: Cardiovascular;;  . CARDIOVERSION N/A 09/06/2020   Procedure: CARDIOVERSION;  Surgeon: Dixie Dials, MD;  Location: [facility];  Service: Cardiovascular;  Laterality: N/A;  . COLONOSCOPY    . EYE SURGERY Bilateral 06/08   retinal detachment OS  . EYE SURGERY Bilateral 1994   cataract OU  . TEE WITHOUT CARDIOVERSION N/A 09/06/2020   Procedure: TRANSESOPHAGEAL ECHOCARDIOGRAM (TEE);  Surgeon: Dixie Dials, MD;  Location: [facility] ENDOSCOPY;  Service: Cardiovascular;  Laterality: N/A;  . TONSILLECTOMY      Family History  Problem Relation Age of Onset  . Cancer Father        back muscle   . Other Father        pituatary tumor  . Lung cancer Father   . Colon polyps Mother   . High blood pressure Brother   . Colon cancer Neg Hx   . Esophageal cancer Neg Hx   . Rectal cancer Neg Hx   . Stomach  cancer Neg Hx     Social History   Socioeconomic History  . Marital status: Widowed    Spouse name: Not on file  . Number of children: 1  . Years of education: Not on file  . Highest education level: Not on file  Occupational History  . Occupation: retired    Fish farm manager: RETIRED  Tobacco Use  . Smoking status: Former Smoker    Years: 20.00    Types: Cigarettes    Quit date: 11/06/1970    Years since quitting: 50.0  . Smokeless tobacco: Never Used  Vaping Use  . Vaping Use: Never used  Substance and Sexual Activity  . Alcohol use: Not Currently  . Drug use: No  . Sexual activity: Yes  Other  Topics Concern  . Not on file  Social History Narrative  . Not on file   Social Determinants of Health   Financial Resource Strain: Not on file  Food Insecurity: Not on file  Transportation Needs: Not on file  Physical Activity: Not on file  Stress: Not on file  Social Connections: Not on file  Intimate Partner Violence: Not on file    Review of systems: Review of Systems  Constitutional: Negative for fever and chills.  HENT: Negative.   Eyes: Negative for blurred vision.  Respiratory: as per HPI  Cardiovascular: Negative for chest pain and palpitations.  Gastrointestinal: Negative for vomiting, diarrhea, blood per rectum. Genitourinary: Negative for dysuria, urgency, frequency and hematuria.  Musculoskeletal: Negative for myalgias, back pain and joint pain.  Skin: Negative for itching and rash.  Neurological: Negative for dizziness, tremors, focal weakness, seizures and loss of consciousness.  Endo/Heme/Allergies: Negative for environmental allergies.  Psychiatric/Behavioral: Negative for depression, suicidal ideas and hallucinations.  All other systems reviewed and are negative.  Physical Exam: Blood pressure 118/68, pulse 67, temperature (!) 97 F (36.1 C), temperature source Temporal, height 6\' 2"  (1.88 m), weight 178 lb 3.2 oz (80.8 kg), SpO2 100 %. Gen:      No acute distress HEENT:  EOMI, sclera anicteric Neck:     No masses; no thyromegaly Lungs:    Clear to auscultation bilaterally; normal respiratory effort CV:         Regular rate and rhythm; no murmurs Abd:      + bowel sounds; soft, non-tender; no palpable masses, no distension Ext:    No edema; adequate peripheral perfusion Skin:      Warm and dry; no rash Neuro: alert and oriented x 3 Psych: normal mood and affect  Data Reviewed: Imaging: CTA 09/03/2020-no PE, moderate to large bilateral effusions with compressive atelectasis, biapical pulmonary infiltrates with bronchial thickening.  CT chest  11/19/2020-complete resolution of bilateral effusions, atelectasis and infiltrates.  Stable biapical scarring with mild traction bronchiectasis, stable subcentimeter pulmonary nodule I have reviewed the images personally.   Cardiac: Echocardiogram 09/04/2020- good LV function, dilated LA/RA, moderate MR & severe TR  Assessment:  Follow-up after hospitalization with atypical pneumonia, lung infiltrates with effusion Back to baseline now Follow-up CT reviewed with resolution of acute abnormalities.  There are no significant active lung issues. Subcentimeter pulmonary nodule is likely benign.  He quit smoking 50 years ago.  He can follow-up in clinic as needed  Plan/Recommendations: Follow-up as needed.  Marshell Garfinkel MD [facility] 11/29/2020, 11:10 AM  CC: [name], MD

## 2020-12-16 ENCOUNTER — Other Ambulatory Visit: Payer: Self-pay

## 2020-12-16 ENCOUNTER — Other Ambulatory Visit: Payer: PPO | Admitting: Internal Medicine

## 2020-12-16 DIAGNOSIS — N400 Enlarged prostate without lower urinary tract symptoms: Secondary | ICD-10-CM | POA: Diagnosis not present

## 2020-12-16 DIAGNOSIS — Z8673 Personal history of transient ischemic attack (TIA), and cerebral infarction without residual deficits: Secondary | ICD-10-CM | POA: Diagnosis not present

## 2020-12-16 DIAGNOSIS — Z0000 Encounter for general adult medical examination without abnormal findings: Secondary | ICD-10-CM | POA: Diagnosis not present

## 2020-12-16 DIAGNOSIS — E7800 Pure hypercholesterolemia, unspecified: Secondary | ICD-10-CM | POA: Diagnosis not present

## 2020-12-16 DIAGNOSIS — N486 Induration penis plastica: Secondary | ICD-10-CM

## 2020-12-16 DIAGNOSIS — E782 Mixed hyperlipidemia: Secondary | ICD-10-CM

## 2020-12-16 DIAGNOSIS — I340 Nonrheumatic mitral (valve) insufficiency: Secondary | ICD-10-CM

## 2020-12-16 DIAGNOSIS — Z79899 Other long term (current) drug therapy: Secondary | ICD-10-CM | POA: Diagnosis not present

## 2020-12-17 LAB — CBC WITH DIFFERENTIAL/PLATELET
Absolute Monocytes: 581 cells/uL (ref 200–950)
Basophils Absolute: 63 cells/uL (ref 0–200)
Basophils Relative: 1.1 %
Eosinophils Absolute: 194 cells/uL (ref 15–500)
Eosinophils Relative: 3.4 %
HCT: 45 % (ref 38.5–50.0)
Hemoglobin: 14.9 g/dL (ref 13.2–17.1)
Lymphs Abs: 1824 cells/uL (ref 850–3900)
MCH: 29.1 pg (ref 27.0–33.0)
MCHC: 33.1 g/dL (ref 32.0–36.0)
MCV: 87.9 fL (ref 80.0–100.0)
MPV: 10.5 fL (ref 7.5–12.5)
Monocytes Relative: 10.2 %
Neutro Abs: 3038 cells/uL (ref 1500–7800)
Neutrophils Relative %: 53.3 %
Platelets: 305 10*3/uL (ref 140–400)
RBC: 5.12 10*6/uL (ref 4.20–5.80)
RDW: 13.6 % (ref 11.0–15.0)
Total Lymphocyte: 32 %
WBC: 5.7 10*3/uL (ref 3.8–10.8)

## 2020-12-17 LAB — COMPLETE METABOLIC PANEL WITH GFR
AG Ratio: 1.6 (calc) (ref 1.0–2.5)
ALT: 17 U/L (ref 9–46)
AST: 18 U/L (ref 10–35)
Albumin: 4.5 g/dL (ref 3.6–5.1)
Alkaline phosphatase (APISO): 99 U/L (ref 35–144)
BUN: 16 mg/dL (ref 7–25)
CO2: 33 mmol/L — ABNORMAL HIGH (ref 20–32)
Calcium: 9.8 mg/dL (ref 8.6–10.3)
Chloride: 103 mmol/L (ref 98–110)
Creat: 0.9 mg/dL (ref 0.70–1.18)
GFR, Est African American: 94 mL/min/{1.73_m2} (ref >=60)
GFR, Est Non African American: 82 mL/min/{1.73_m2} (ref >=60)
Globulin: 2.8 g/dL (calc) (ref 1.9–3.7)
Glucose, Bld: 89 mg/dL (ref 65–99)
Potassium: 4.7 mmol/L (ref 3.5–5.3)
Sodium: 141 mmol/L (ref 135–146)
Total Bilirubin: 0.6 mg/dL (ref 0.2–1.2)
Total Protein: 7.3 g/dL (ref 6.1–8.1)

## 2020-12-17 LAB — LIPID PANEL
Cholesterol: 177 mg/dL (ref <200)
HDL: 47 mg/dL (ref >=40)
LDL Cholesterol (Calc): 103 mg/dL (calc) — ABNORMAL HIGH
Non-HDL Cholesterol (Calc): 130 mg/dL (calc) — ABNORMAL HIGH (ref <130)
Total CHOL/HDL Ratio: 3.8 (calc) (ref <5.0)
Triglycerides: 158 mg/dL — ABNORMAL HIGH (ref <150)

## 2020-12-17 LAB — PSA: PSA: 4.21 ng/mL — ABNORMAL HIGH (ref <=4.0)

## 2020-12-21 ENCOUNTER — Encounter: Payer: Self-pay | Admitting: Internal Medicine

## 2020-12-21 ENCOUNTER — Ambulatory Visit (INDEPENDENT_AMBULATORY_CARE_PROVIDER_SITE_OTHER): Payer: PPO | Admitting: Internal Medicine

## 2020-12-21 ENCOUNTER — Other Ambulatory Visit: Payer: Self-pay

## 2020-12-21 VITALS — BP 130/60 | HR 60 | Ht 74.0 in | Wt 180.0 lb

## 2020-12-21 DIAGNOSIS — Z0000 Encounter for general adult medical examination without abnormal findings: Secondary | ICD-10-CM | POA: Diagnosis not present

## 2020-12-21 DIAGNOSIS — Z8673 Personal history of transient ischemic attack (TIA), and cerebral infarction without residual deficits: Secondary | ICD-10-CM | POA: Diagnosis not present

## 2020-12-21 DIAGNOSIS — J189 Pneumonia, unspecified organism: Secondary | ICD-10-CM | POA: Diagnosis not present

## 2020-12-21 DIAGNOSIS — Z79899 Other long term (current) drug therapy: Secondary | ICD-10-CM

## 2020-12-21 DIAGNOSIS — R0989 Other specified symptoms and signs involving the circulatory and respiratory systems: Secondary | ICD-10-CM | POA: Diagnosis not present

## 2020-12-21 DIAGNOSIS — N400 Enlarged prostate without lower urinary tract symptoms: Secondary | ICD-10-CM

## 2020-12-21 DIAGNOSIS — Z8679 Personal history of other diseases of the circulatory system: Secondary | ICD-10-CM

## 2020-12-21 DIAGNOSIS — F5102 Adjustment insomnia: Secondary | ICD-10-CM

## 2020-12-21 DIAGNOSIS — E782 Mixed hyperlipidemia: Secondary | ICD-10-CM | POA: Diagnosis not present

## 2020-12-21 DIAGNOSIS — Z7901 Long term (current) use of anticoagulants: Secondary | ICD-10-CM

## 2020-12-21 DIAGNOSIS — Z8659 Personal history of other mental and behavioral disorders: Secondary | ICD-10-CM | POA: Diagnosis not present

## 2020-12-21 DIAGNOSIS — Z87438 Personal history of other diseases of male genital organs: Secondary | ICD-10-CM | POA: Diagnosis not present

## 2020-12-21 DIAGNOSIS — N486 Induration penis plastica: Secondary | ICD-10-CM

## 2020-12-21 DIAGNOSIS — R972 Elevated prostate specific antigen [PSA]: Secondary | ICD-10-CM

## 2020-12-21 LAB — POCT URINALYSIS DIPSTICK
Appearance: NEGATIVE
Bilirubin, UA: NEGATIVE
Glucose, UA: NEGATIVE
Ketones, UA: NEGATIVE
Leukocytes, UA: NEGATIVE
Nitrite, UA: NEGATIVE
Odor: NEGATIVE
Protein, UA: NEGATIVE
Spec Grav, UA: 1.015 (ref 1.010–1.025)
Urobilinogen, UA: 0.2 E.U./dL
pH, UA: 6 (ref 5.0–8.0)

## 2020-12-21 NOTE — Progress Notes (Signed)
Subjective:    Patient ID: [name], male    DOB: Feb 02, 1941, 80 y.o.   MRN: [id number]  HPI 80 year old  Male seen for Medicare wellness and health maintenance exam.  He was hospitalized with atypical pneumonia and atrial flutter October 29 through September 07, 2020 after presenting to the emergency department with fatigue and shortness of breath.  At that time he had not been vaccinated for COVID-19.  He tested negative for COVID-19.  CTA was negative for PE but he had bilateral pleural effusions and biapical groundglass appearance of the upper lobes consistent with atypical pneumonia.  He tested negative for legionnaires disease.  Sputum showed a few gram-negative rods, moderate gram-positive cocci in pairs and clusters and Gram positive rods that were regular.  He had previously received Prevnar 13 and pneumococcal 23 vaccines in 2018 and 2012 respectively.  He also has had a flu vaccine in January 2021.  He was placed on a Cardizem drip and placed on IV heparin and subsequently Lovenox.  He had a TEE conversion by Dr. [name] on November 1 which was successful.  Since then he has remained in sinus rhythm and he is now on Eliquis.  He was treated with IV antibiotics for pneumonia.  Pulmonologist recommended follow-up CT in 1 to 2 months as an outpatient.  He was discharged home on Omnicef and erythromycin his BNP was normal.  He did receive IV Lasix during his hospitalization.  Since then he has done well.  He saw Dr. Illene Bolus in December 2021 for evaluation of atrial flutter and mitral regurgitation.  He was told he remained on Eliquis.  Digoxin was stopped.  Continued on diltiazem and Toprol.  If recurrence, it was suggested he might need an ablation.  He was continued on Crestor 5 mg daily.  Suggested decreasing Toprol to 50 mg daily if blood pressure remains normal.  Cardiologist did not think tricuspid regurgitation was severe.  Was not in heart failure.  Follow-up was to be in 6  months.  Social history: He is  widower and resides alone.  Wife died of complications of COPD.  He likes to spend time outdoors.  He likes to fish.  He is retired from [employer].  Does not smoke.  Son died in a motor vehicle accident many years ago before his wife died.  No other children.  Past medical history: Tonsillectomy 1949, bilateral cataract extraction 1994.  Surgery for retinal detachment of left eye June 2001.  Colonoscopy done March 2005.  History of positional vertigo in the past with episodes of cervical radiculopathy.  Had negative exercise tolerance test 1999.  Had tetanus immunization 2015.    Review of Systems see above-no new complaints and generally feels pretty well     Objective:   Physical Exam  Blood pressure 130/60 pulse 60 regular pulse oximetry 98% weight 180 pounds BMI 23.11 height 6 feet 2 inches  Skin is warm and dry.  Nodes none.  TMs are clear.  Neck is supple.  Chest clear to auscultation.  He has a left carotid bruit.  No thyromegaly.  Cardiac exam: Regular rate and rhythm.  Abdomen: Soft nondistended without hepatosplenomegaly, masses, or tenderness.  No lower extremity pitting edema.  Neuro: Intact without focal deficits.  Affect felt judgment are normal.      Assessment & Plan:  Recently hospitalized with atypical pneumonia associated with atrial flutter.  Tested negative for COVID-19.  Not vaccinated for COVID-19.  History of left  carotid bruit-carotid Dopplers in 2021 which did not show significant stenosis  Hyperlipidemia-stable on low-dose Crestor  History of elevated PSA followed by urologist  History of Peyronie's disease followed by urologist  History of anxiety and insomnia treated sparingly with Xanax.  History of TIA in March 2021 with no recurrence  Plan: He will continue with current medications and follow-up with cardiologist in 6 months.  I will see him again in 1 year or as needed.  Subjective:   Patient presents for  Medicare Annual/Subsequent preventive examination.  Review Past Medical/Family/Social: See above   Risk Factors  Current exercise habits: Physically active Dietary issues discussed: Yes  Cardiac risk factors: Hyperlipidemia.  History of atrial flutter.  Depression Screen  (Note: if answer to either of the following is "Yes", a more complete depression screening is indicated)   Over the past two weeks, have you felt down, depressed or hopeless? No  Over the past two weeks, have you felt little interest or pleasure in doing things? No Have you lost interest or pleasure in daily life? No Do you often feel hopeless? No Do you cry easily over simple problems? No   Activities of Daily Living  In your present state of health, do you have any difficulty performing the following activities?:   Driving? No  Managing money? No  Feeding yourself? No  Getting from bed to chair? No  Climbing a flight of stairs? No  Preparing food and eating?: No  Bathing or showering? No  Getting dressed: No  Getting to the toilet? No  Using the toilet:No  Moving around from place to place: No  In the past year have you fallen or had a near fall?:No  Are you sexually active? No  Do you have more than one partner? No   Hearing Difficulties: No  Do you often ask people to speak up or repeat themselves? No  Do you experience ringing or noises in your ears? No  Do you have difficulty understanding soft or whispered voices? No  Do you feel that you have a problem with memory? No Do you often misplace items? No    Home Safety:  Do you have a smoke alarm at your residence? Yes Do you have grab bars in the bathroom?  Yes Do you have throw rugs in your house?  No   Cognitive Testing  Alert? Yes Normal Appearance?Yes  Oriented to person? Yes Place? Yes  Time? Yes  Recall of three objects? Yes  Can perform simple calculations? Yes  Displays appropriate judgment?Yes  Can read the correct time from a  watch face?Yes   List the Names of Other Physician/Practitioners you currently use:  See referral list for the physicians patient is currently seeing.  Noted-Cardiology and  [facility]   Review of Systems: See above   Objective:     General appearance: Appears stated age and slim Head: Normocephalic, without obvious abnormality, atraumatic  Eyes: conj clear, EOMi PEERLA  Ears: normal TM's and external ear canals both ears  Nose: Nares normal. Septum midline. Mucosa normal. No drainage or sinus tenderness.  Throat: lips, mucosa, and tongue normal; teeth and gums normal  Neck: no adenopathy, no carotid bruit, no JVD, supple, symmetrical, trachea midline and thyroid not enlarged, symmetric, no tenderness/mass/nodules  No CVA tenderness.  Lungs: clear to auscultation bilaterally  Breasts: normal appearance, no masses or tenderness Heart: regular rate and rhythm, S1, S2 normal, no murmur, click, rub or gallop  Abdomen: soft, non-tender; bowel sounds  normal; no masses, no organomegaly  Musculoskeletal: ROM normal in all joints, no crepitus, no deformity, Normal muscle strengthen. Back  is symmetric, no curvature. Skin: Skin color, texture, turgor normal. No rashes or lesions  Lymph nodes: Cervical, supraclavicular, and axillary nodes normal.  Neurologic: CN 2 -12 Normal, Normal symmetric reflexes. Normal coordination and gait  Psych: Alert & Oriented x 3, Mood appear stable.    Assessment:    Annual wellness Medicare exam   Plan:    During the course of the visit the patient was educated and counseled about appropriate screening and preventive services including:   Suggested COVID-19 vaccine and he will think about it  Tetanus immunization is up-to-date  Had flu vaccine in September 2021 and has had 2 pneumococcal vaccines  Had colonoscopy in 2020  Followed by urology for Peyronie's disease and BPH.  PSA is 4.21 and was 3.7 a year ago   Patient Instructions (the  written plan) was given to the patient.  Medicare Attestation  I have personally reviewed:  The patient's medical and social history  Their use of alcohol, tobacco or illicit drugs  Their current medications and supplements  The patient's functional ability including ADLs,fall risks, home safety risks, cognitive, and hearing and visual impairment  Diet and physical activities  Evidence for depression or mood disorders  The patient's weight, height, BMI, and visual acuity have been recorded in the chart. I have made referrals, counseling, and provided education to the patient based on review of the above and I have provided the patient with a written personalized care plan for preventive services.

## 2020-12-26 ENCOUNTER — Other Ambulatory Visit: Payer: Self-pay | Admitting: Interventional Cardiology

## 2020-12-26 DIAGNOSIS — I4892 Unspecified atrial flutter: Secondary | ICD-10-CM

## 2020-12-27 ENCOUNTER — Ambulatory Visit (INDEPENDENT_AMBULATORY_CARE_PROVIDER_SITE_OTHER): Payer: PPO | Admitting: Ophthalmology

## 2020-12-27 ENCOUNTER — Other Ambulatory Visit: Payer: Self-pay

## 2020-12-27 ENCOUNTER — Encounter (INDEPENDENT_AMBULATORY_CARE_PROVIDER_SITE_OTHER): Payer: Self-pay | Admitting: Ophthalmology

## 2020-12-27 DIAGNOSIS — T8522XD Displacement of intraocular lens, subsequent encounter: Secondary | ICD-10-CM | POA: Insufficient documentation

## 2020-12-27 DIAGNOSIS — H35352 Cystoid macular degeneration, left eye: Secondary | ICD-10-CM | POA: Diagnosis not present

## 2020-12-27 DIAGNOSIS — Z8669 Personal history of other diseases of the nervous system and sense organs: Secondary | ICD-10-CM

## 2020-12-27 NOTE — Assessment & Plan Note (Signed)
History of CME OS, improved on topical NSAIDs.  No impact of suture fixation of posterior chamber intraocular lens via Gore-Tex OS, stable over time patient may continue without topical use of medications

## 2020-12-27 NOTE — Telephone Encounter (Signed)
Eliquis 5mg  refill request received. Patient is 80 years old, weight-81.6kg, Crea-0.90 on 12/16/20, Diagnosis-Aflutter, and last seen by Dr. Irish Lack on 10/21/2020. Dose is appropriate based on dosing criteria. Will send in refill to requested pharmacy.

## 2020-12-27 NOTE — Progress Notes (Signed)
12/27/2020     CHIEF COMPLAINT Patient presents for Retina Follow Up (1 YR FU OU///Pt reports stable vision OU. Pt denies any new F/F, pain, or pressure OU. )   HISTORY OF PRESENT ILLNESS: [name] is a 80 y.o. male who presents to the clinic today for:   HPI    Retina Follow Up    Patient presents with  Other.  In both eyes.  This started 1 year ago.  Duration of 1 year.  Since onset it is stable. Additional comments: 1 YR FU OU   Pt reports stable vision OU. Pt denies any new F/F, pain, or pressure OU.        Last edited by Nichola Sizer D on 12/27/2020 10:04 AM. (History)      Referring physician: [name], MD [address] [place],  [place] [id number]  HISTORICAL INFORMATION:   Selected notes from the MEDICAL RECORD NUMBER    Lab Results  Component Value Date   HGBA1C 5.1 09/07/2020     CURRENT MEDICATIONS: No current outpatient medications on file. (Ophthalmic Drugs)   No current facility-administered medications for this visit. (Ophthalmic Drugs)   Current Outpatient Medications (Other)  Medication Sig  . diltiazem (CARDIZEM CD) 300 MG 24 hr capsule Take 1 capsule (300 mg total) by mouth daily.  Marland Kitchen ELIQUIS 5 MG TABS tablet TAKE 1 TABLET BY MOUTH TWICE A DAY  . famotidine (PEPCID) 20 MG tablet Take 1 tablet (20 mg total) by mouth daily.  . furosemide (LASIX) 20 MG tablet Take 1 tablet (20 mg total) by mouth daily.  . metoprolol succinate (TOPROL-XL) 100 MG 24 hr tablet TAKE 1 TABLET BY MOUTH EVERY DAY  . rosuvastatin (CRESTOR) 5 MG tablet Take 1 tablet (5 mg total) by mouth at bedtime. (Patient taking differently: Take 5 mg by mouth daily.)   No current facility-administered medications for this visit. (Other)      REVIEW OF SYSTEMS:    ALLERGIES Allergies  Allergen Reactions  . Wellbutrin [Bupropion Hcl] Rash    PAST MEDICAL HISTORY Past Medical History:  Diagnosis Date  . Allergy    seasonal  . BPH (benign prostatic  hypertrophy)   . Cataract    bilateral-removed "back in 90's"  . Eczema   . Hyperlipidemia   . Peyronie's disease   . Retinal tear 2000's   bilateral-surgery to repair.  Marland Kitchen Urticaria    Past Surgical History:  Procedure Laterality Date  . BUBBLE STUDY  09/06/2020   Procedure: BUBBLE STUDY;  Surgeon: Dixie Dials, MD;  Location: [place];  Service: Cardiovascular;;  . CARDIOVERSION N/A 09/06/2020   Procedure: CARDIOVERSION;  Surgeon: Dixie Dials, MD;  Location: [place];  Service: Cardiovascular;  Laterality: N/A;  . COLONOSCOPY    . EYE SURGERY Bilateral 06/08   retinal detachment OS  . EYE SURGERY Bilateral 1994   cataract OU  . TEE WITHOUT CARDIOVERSION N/A 09/06/2020   Procedure: TRANSESOPHAGEAL ECHOCARDIOGRAM (TEE);  Surgeon: Dixie Dials, MD;  Location[facility] ENDOSCOPY;  Service: Cardiovascular;  Laterality: N/A;  . TONSILLECTOMY      FAMILY HISTORY Family History  Problem Relation Age of Onset  . Cancer Father        back muscle   . Other Father        pituatary tumor  . Lung cancer Father   . Colon polyps Mother   . High blood pressure Brother   . Colon cancer Neg Hx   . Esophageal cancer  Neg Hx   . Rectal cancer Neg Hx   . Stomach cancer Neg Hx     SOCIAL HISTORY Social History   Tobacco Use  . Smoking status: Former Smoker    Years: 20.00    Types: Cigarettes    Quit date: 11/06/1970    Years since quitting: 50.1  . Smokeless tobacco: Never Used  Vaping Use  . Vaping Use: Never used  Substance Use Topics  . Alcohol use: Not Currently  . Drug use: No         OPHTHALMIC EXAM:  Base Eye Exam    Visual Acuity (ETDRS)      Right Left   Dist cc 20/30 -1 20/30 +2   Dist ph cc 20/30 +1 20/25 -2   Correction: Glasses       Tonometry (Tonopen, 10:11 AM)      Right Left   Pressure 12 14       Pupils      Pupils Dark Light Shape React APD   Right PERRL 4 4 Round Sluggish None   Left PERRL 3 3 Irregular Sluggish None       Visual  Fields (Counting fingers)      Left Right    Full Full       Extraocular Movement      Right Left    Full Full       Neuro/Psych    Oriented x3: Yes       Dilation    Both eyes: 1.0% Mydriacyl, 2.5% Phenylephrine @ 10:11 AM        Slit Lamp and Fundus Exam    External Exam      Right Left   External Normal Normal       Slit Lamp Exam      Right Left   Lids/Lashes Normal Normal   Conjunctiva/Sclera White and quiet White and quiet, no no exposed   Cornea Clear Clear   Anterior Chamber Deep and quiet Deep and quiet   Iris Round and reactive Round and reactive   Lens Centered posterior chamber intraocular lens, in the capsule bag Centered posterior chamber intraocular lens, no capsule remains sutured PCIOL   Anterior Vitreous Normal Normal       Fundus Exam      Right Left   Posterior Vitreous Clear, avitric Clear, avitric   Disc Normal, mild.  Pap atrophy Normal, mild.  Pap atrophy   C/D Ratio 0.2 0.1   Macula Normal Normal   Vessels Normal Normal   Periphery Good buckle peripherally, chorioretinal scarring, history of RD, a attached 360 Good scleral buckle peripherally, good chorioretinal scars          IMAGING AND PROCEDURES  Imaging and Procedures for 12/27/20  OCT, Retina - OU - Both Eyes       Right Eye Quality was good. Scan locations included subfoveal. Central Foveal Thickness: 362. Progression has been stable.   Left Eye Quality was good. Scan locations included subfoveal. Central Foveal Thickness: 364. Progression has been stable. Findings include abnormal foveal contour.   Notes Mild chronic retinal thickening in the macular region stable over time, OS and OD similar       Color Fundus Photography Optos - OU - Both Eyes       Right Eye Disc findings include normal observations. Macula : normal observations. Vessels : normal observations.   Left Eye Progression has been stable. Disc findings include normal observations. Macula : normal  observations.  Vessels : normal observations.   Notes OU with good scleral buckle, chorioretinal scar from laser retinopexy and reattachment                ASSESSMENT/PLAN:  Cystoid macular edema of left eye History of CME OS, improved on topical NSAIDs.  No impact of suture fixation of posterior chamber intraocular lens via Gore-Tex OS, stable over time patient may continue without topical use of medications      ICD-10-CM   1. Cystoid macular edema of left eye  H35.352 OCT, Retina - OU - Both Eyes    Color Fundus Photography Optos - OU - Both Eyes  2. History of retinal detachment  Z86.69 OCT, Retina - OU - Both Eyes    Color Fundus Photography Optos - OU - Both Eyes  3. Dislocated intraocular lens, subsequent encounter  T85.22XD     1.  OU, doing extremely well with excellent visual acuity retained.  History of retinal detachment OU 2005 2006  History of subsequent in the bag dislocation intraocular lens left eye repair 2018 each doing well 2.  3.  Ophthalmic Meds Ordered this visit:  No orders of the defined types were placed in this encounter.      Return in about 1 year (around 12/27/2021) for COLOR FP, OCT.  There are no Patient Instructions on file for this visit.   Explained the diagnoses, plan, and follow up with the patient and they expressed understanding.  Patient expressed understanding of the importance of proper follow up care.   Clent Demark Mathayus Stanbery M.D. Diseases & Surgery of the Retina and Vitreous [facility]ina [facility] [place] 12/27/20     Abbreviations: M myopia (nearsighted); A astigmatism; H hyperopia (farsighted); P presbyopia; Mrx spectacle prescription;  CTL contact lenses; OD right eye; OS left eye; OU both eyes  XT exotropia; ET esotropia; PEK punctate epithelial keratitis; PEE punctate epithelial erosions; DES dry eye syndrome; MGD meibomian gland dysfunction; ATs artificial tears; PFAT's preservative free artificial tears; Sumpter nuclear  sclerotic cataract; PSC posterior subcapsular cataract; ERM epi-retinal membrane; PVD posterior vitreous detachment; RD retinal detachment; DM diabetes mellitus; DR diabetic retinopathy; NPDR non-proliferative diabetic retinopathy; PDR proliferative diabetic retinopathy; CSME clinically significant macular edema; DME diabetic macular edema; dbh dot blot hemorrhages; CWS cotton wool spot; POAG primary open angle glaucoma; C/D cup-to-disc ratio; HVF humphrey visual field; GVF goldmann visual field; OCT optical coherence tomography; IOP intraocular pressure; BRVO Branch retinal vein occlusion; CRVO central retinal vein occlusion; CRAO central retinal artery occlusion; BRAO branch retinal artery occlusion; RT retinal tear; SB scleral buckle; PPV pars plana vitrectomy; VH Vitreous hemorrhage; PRP panretinal laser photocoagulation; IVK intravitreal kenalog; VMT vitreomacular traction; MH Macular hole;  NVD neovascularization of the disc; NVE neovascularization elsewhere; AREDS age related eye disease study; ARMD age related macular degeneration; POAG primary open angle glaucoma; EBMD epithelial/anterior basement membrane dystrophy; ACIOL anterior chamber intraocular lens; IOL intraocular lens; PCIOL posterior chamber intraocular lens; Phaco/IOL phacoemulsification with intraocular lens placement; Maxwell photorefractive keratectomy; LASIK laser assisted in situ keratomileusis; HTN hypertension; DM diabetes mellitus; COPD chronic obstructive pulmonary disease

## 2020-12-28 DIAGNOSIS — D0471 Carcinoma in situ of skin of right lower limb, including hip: Secondary | ICD-10-CM | POA: Diagnosis not present

## 2020-12-28 DIAGNOSIS — L821 Other seborrheic keratosis: Secondary | ICD-10-CM | POA: Diagnosis not present

## 2020-12-28 DIAGNOSIS — L578 Other skin changes due to chronic exposure to nonionizing radiation: Secondary | ICD-10-CM | POA: Diagnosis not present

## 2021-01-13 DIAGNOSIS — R972 Elevated prostate specific antigen [PSA]: Secondary | ICD-10-CM | POA: Diagnosis not present

## 2021-01-13 DIAGNOSIS — N138 Other obstructive and reflux uropathy: Secondary | ICD-10-CM | POA: Diagnosis not present

## 2021-01-13 DIAGNOSIS — N529 Male erectile dysfunction, unspecified: Secondary | ICD-10-CM | POA: Diagnosis not present

## 2021-01-13 DIAGNOSIS — N401 Enlarged prostate with lower urinary tract symptoms: Secondary | ICD-10-CM | POA: Diagnosis not present

## 2021-01-30 NOTE — Patient Instructions (Addendum)
PSA is elevated and may be due to BPH.  A copy of this test was faxed to [facility].  Continue current medications.  Follow-up with Dr. Illene Bolus in 6 months.  Consider being vaccinated for COVID-19.  Otherwise return in 1 year or as needed.  Continue chronic anticoagulation.

## 2021-02-07 DIAGNOSIS — H40013 Open angle with borderline findings, low risk, bilateral: Secondary | ICD-10-CM | POA: Diagnosis not present

## 2021-02-07 DIAGNOSIS — H31093 Other chorioretinal scars, bilateral: Secondary | ICD-10-CM | POA: Diagnosis not present

## 2021-02-07 DIAGNOSIS — Z961 Presence of intraocular lens: Secondary | ICD-10-CM | POA: Diagnosis not present

## 2021-02-25 ENCOUNTER — Other Ambulatory Visit: Payer: Self-pay | Admitting: Neurology

## 2021-03-01 ENCOUNTER — Telehealth: Payer: Self-pay | Admitting: Adult Health

## 2021-03-01 ENCOUNTER — Telehealth: Payer: Self-pay | Admitting: Internal Medicine

## 2021-03-01 MED ORDER — ROSUVASTATIN CALCIUM 5 MG PO TABS: 5.0000 mg | ORAL_TABLET | Freq: Every day | ORAL | 3 refills | Status: DC

## 2021-03-01 NOTE — Telephone Encounter (Signed)
Refill #[id number] with 3 refills

## 2021-03-01 NOTE — Telephone Encounter (Signed)
[name] [phone]  [name] called to see if you would take over filling his below medication, it was being filled by Dr [name], however his office has instructed him that this needs to be handled by PCP.  rosuvastatin (CRESTOR) 5 MG tablet  [id number] - [place], [place] - [address] Phone:  [phone]  Fax:  [phone]

## 2021-03-01 NOTE — Telephone Encounter (Signed)
Spoke to pt and relayed that pcp to continue refilling crestor for him.  He verbalized understanding and will call them to continue prescribing.  He appreciated call back.

## 2021-03-01 NOTE — Telephone Encounter (Signed)
Pt called wanting to speak to the RN to find out why he is not able to refill his rosuvastatin (CRESTOR) 5 MG tablet(Expired) Pt has been seen in the last 6 months. Please advise.

## 2021-04-19 NOTE — Progress Notes (Signed)
Cardiology Office Note   Date:  04/21/2021   ID:  [name], DOB 1941/07/10, MRN [id number]  PCP:  Shawn Showers, MD    No chief complaint on file.  PAF  Wt Readings from Last 3 Encounters:  04/21/21 180 lb 3.2 oz (81.7 kg)  12/21/20 180 lb (81.6 kg)  11/29/20 178 lb 3.2 oz (80.8 kg)       History of Present Illness: [name] is a 80 y.o. male  who was admitted to the hospital with atrial flutter in the setting of atypical pneumonia in 08/2020.  He was found to have a pleural effusion.  He converted to normal sinus rhythm with DCCV.  He was seen by Dr. [name] in the hospital.   He was sent home on Eliquis for stroke prevention, along with diltiazem and digoxin.   EF was normal.  Had a pleural effusion which resolved with antibiotics.     As of 12/21, he had not had any bleeding problems.   Denies : Chest pain. Dizziness. Leg edema. Nitroglycerin use. Orthopnea. Palpitations. Paroxysmal nocturnal dyspnea. Shortness of breath. Syncope.    [employer] work is the most strenuous exercise.  No sx while he was in AFib back in 2021.     Past Medical History:  Diagnosis Date   Allergy    seasonal   BPH (benign prostatic hypertrophy)    Cataract    bilateral-removed "back in 90's"   Eczema    Hyperlipidemia    Peyronie's disease    Retinal tear 2000's   bilateral-surgery to repair.   Urticaria     Past Surgical History:  Procedure Laterality Date   BUBBLE STUDY  09/06/2020   Procedure: BUBBLE STUDY;  Surgeon: Dixie Dials, MD;  Location: [place];  Service: Cardiovascular;;   CARDIOVERSION N/A 09/06/2020   Procedure: CARDIOVERSION;  Surgeon: Dixie Dials, MD;  Location: [place];  Service: Cardiovascular;  Laterality: N/A;   COLONOSCOPY     EYE SURGERY Bilateral 06/08   retinal detachment OS   EYE SURGERY Bilateral 1994   cataract OU   TEE WITHOUT CARDIOVERSION N/A 09/06/2020   Procedure: TRANSESOPHAGEAL ECHOCARDIOGRAM (TEE);  Surgeon: Dixie Dials, MD;  Location: [facility] ENDOSCOPY;  Service: Cardiovascular;  Laterality: N/A;   TONSILLECTOMY       Current Outpatient Medications  Medication Sig Dispense Refill   diltiazem (CARDIZEM CD) 300 MG 24 hr capsule Take 1 capsule (300 mg total) by mouth daily. 90 capsule 1   ELIQUIS 5 MG TABS tablet TAKE 1 TABLET BY MOUTH TWICE A DAY 60 tablet 10   famotidine (PEPCID) 20 MG tablet Take 1 tablet (20 mg total) by mouth daily. 30 tablet 4   furosemide (LASIX) 20 MG tablet Take 1 tablet (20 mg total) by mouth daily. 90 tablet 1   metoprolol succinate (TOPROL-XL) 100 MG 24 hr tablet TAKE 1 TABLET BY MOUTH EVERY DAY 90 tablet 3   rosuvastatin (CRESTOR) 5 MG tablet Take 1 tablet (5 mg total) by mouth at bedtime. 90 tablet 3   No current facility-administered medications for this visit.    Allergies:   Wellbutrin [bupropion hcl]    Social History:  The patient  reports that he quit smoking about 50 years ago. His smoking use included cigarettes. He has never used smokeless tobacco. He reports previous alcohol use. He reports that he does not use drugs.   Family History:  The patient's family history includes Cancer in his father; Colon polyps  in his mother; High blood pressure in his brother; Lung cancer in his father; Other in his father.    ROS:  Please see the history of present illness.   Otherwise, review of systems are positive for derceased energy.   All other systems are reviewed and negative.    PHYSICAL EXAM: VS:  BP 130/60   Pulse 92   Ht 6\' 2"  (1.88 m)   Wt 180 lb 3.2 oz (81.7 kg)   SpO2 97%   BMI 23.14 kg/m  , BMI Body mass index is 23.14 kg/m. GEN: Well nourished, well developed, in no acute distress HEENT: normal Neck: no JVD, carotid bruits, or masses Cardiac: RRR; no murmurs, rubs, or gallops,no edema  Respiratory:  clear to auscultation bilaterally, normal work of breathing GI: soft, nontender, nondistended, + BS MS: no deformity or atrophy Skin: warm and dry, no  rash Neuro:  Strength and sensation are intact Psych: euthymic mood, full affect    Recent Labs: 09/04/2020: TSH 2.527 09/05/2020: B Natriuretic Peptide 69.3 09/07/2020: Magnesium 2.4 12/16/2020: ALT 17; BUN 16; Creat 0.90; Hemoglobin 14.9; Platelets 305; Potassium 4.7; Sodium 141   Lipid Panel    Component Value Date/Time   CHOL 177 12/16/2020 1143   TRIG 158 (H) 12/16/2020 1143   HDL 47 12/16/2020 1143   CHOLHDL 3.8 12/16/2020 1143   VLDL 32 (H) 01/30/2017 1019   LDLCALC 103 (H) 12/16/2020 1143     Other studies Reviewed: Additional studies/ records that were reviewed today with results demonstrating: prior hospital records reviewed.   ASSESSMENT AND PLAN:  Atrial flutter: Seems to be in NSR.  No palpitations.  He was unaware when he was in atrial flutter before.  Hyperlipidemia: The current medical regimen is effective;  continue present plan and medications.  Continue rosuvastatin.  Whole food, plant-based diet.  Increase fiber intake.  Avoid processed foods. HTN: BP normal on Cardizem and Toprol.  Home readings reviewed and well controlled.    Anticoagulated: No bleeding  issues.  Continue to monitor CBC and renal function with primary care doctor. MR/TR: No CHF.  No significant murmur noted on exam. Aortic atherosclerosis.:  Noted on prior TEE.  Continue rosuvastatin.   Current medicines are reviewed at length with the patient today.  The patient concerns regarding his medicines were addressed.  The following changes have been made:  No change  Labs/ tests ordered today include:  No orders of the defined types were placed in this encounter.   Recommend 150 minutes/week of aerobic exercise Low fat, low carb, high fiber diet recommended  Disposition:   FU in 1 year   Signed, Shawn Grooms, MD  04/21/2021 1:39 PM    [facility] [address], [place], [place]  [address] Phone: [phone]; Fax: [phone]

## 2021-04-21 ENCOUNTER — Other Ambulatory Visit: Payer: Self-pay

## 2021-04-21 ENCOUNTER — Encounter: Payer: Self-pay | Admitting: Interventional Cardiology

## 2021-04-21 ENCOUNTER — Ambulatory Visit: Payer: PPO | Admitting: Interventional Cardiology

## 2021-04-21 VITALS — BP 130/60 | HR 92 | Ht 74.0 in | Wt 180.2 lb

## 2021-04-21 DIAGNOSIS — I4892 Unspecified atrial flutter: Secondary | ICD-10-CM | POA: Diagnosis not present

## 2021-04-21 DIAGNOSIS — I361 Nonrheumatic tricuspid (valve) insufficiency: Secondary | ICD-10-CM | POA: Diagnosis not present

## 2021-04-21 DIAGNOSIS — I700 Atherosclerosis of aorta: Secondary | ICD-10-CM | POA: Diagnosis not present

## 2021-04-21 DIAGNOSIS — I340 Nonrheumatic mitral (valve) insufficiency: Secondary | ICD-10-CM

## 2021-04-21 DIAGNOSIS — E782 Mixed hyperlipidemia: Secondary | ICD-10-CM

## 2021-04-21 NOTE — Patient Instructions (Addendum)
Medication Instructions:  Your physician recommends that you continue on your current medications as directed. Please refer to the Current Medication list given to you today.  *If you need a refill on your cardiac medications before your next appointment, please call your pharmacy*   Lab Work: none If you have labs (blood work) drawn today and your tests are completely normal, you will receive your results only by: [place] (if you have MyChart) OR A paper copy in the mail If you have any lab test that is abnormal or we need to change your treatment, we will call you to review the results.   Testing/Procedures: none   Follow-Up: At [facility], you and your health needs are our priority.  As part of our continuing mission to provide you with exceptional heart care, we have created designated Provider Care Teams.  These Care Teams include your primary Cardiologist (physician) and Advanced Practice Providers (APPs -  Physician Assistants and Nurse Practitioners) who all work together to provide you with the care you need, when you need it.  We recommend signing up for the patient portal called "MyChart".  Sign up information is provided on this After Visit Summary.  MyChart is used to connect with patients for Virtual Visits (Telemedicine).  Patients are able to view lab/test results, encounter notes, upcoming appointments, etc.  Non-urgent messages can be sent to your provider as well.   To learn more about what you can do with MyChart, go to [email or web address].    Your next appointment:   12 month(s)  The format for your next appointment:   In Person  Provider:   You may see Larae Grooms, MD or one of the following Advanced Practice Providers on your designated Care Team:   Melina Copa, PA-C Ermalinda Barrios, PA-C   Other Instructions High-Fiber Eating Plan Fiber, also called dietary fiber, is a type of carbohydrate. It is found foods such as fruits, vegetables,  whole grains, and beans. A high-fiber diet can have many health benefits. Your health care provider may recommend a high-fiber diet to help: Prevent constipation. Fiber can make your bowel movements more regular. Lower your cholesterol. Relieve the following conditions: Inflammation of veins in the anus (hemorrhoids). Inflammation of specific areas of the digestive tract (uncomplicated diverticulosis). A problem of the large intestine, also called the colon, that sometimes causes pain and diarrhea (irritable bowel syndrome, or IBS). Prevent overeating as part of a weight-loss plan. Prevent heart disease, type 2 diabetes, and certain cancers. What are tips for following this plan? Reading food labels  Check the nutrition facts label on food products for the amount of dietary fiber. Choose foods that have 5 grams of fiber or more per serving. The goals for recommended daily fiber intake include: Men (age 49 or younger): 34-38 g. Men (over age 62): 28-34 g. Women (age 45 or younger): 25-28 g. Women (over age 54): 22-25 g. Your daily fiber goal is _____________ g. Shopping Choose whole fruits and vegetables instead of processed forms, such as apple juice or applesauce. Choose a wide variety of high-fiber foods such as avocados, lentils, oats, and kidney beans. Read the nutrition facts label of the foods you choose. Be aware of foods with added fiber. These foods often have high sugar and sodium amounts per serving. Cooking Use whole-grain flour for baking and cooking. Cook with brown rice instead of white rice. Meal planning Start the day with a breakfast that is high in fiber, such as a cereal that  contains 5 g of fiber or more per serving. Eat breads and cereals that are made with whole-grain flour instead of refined flour or white flour. Eat brown rice, bulgur wheat, or millet instead of white rice. Use beans in place of meat in soups, salads, and pasta dishes. Be sure that half of the  grains you eat each day are whole grains. General information You can get the recommended daily intake of dietary fiber by: Eating a variety of fruits, vegetables, grains, nuts, and beans. Taking a fiber supplement if you are not able to take in enough fiber in your diet. It is better to get fiber through food than from a supplement. Gradually increase how much fiber you consume. If you increase your intake of dietary fiber too quickly, you may have bloating, cramping, or gas. Drink plenty of water to help you digest fiber. Choose high-fiber snacks, such as berries, raw vegetables, nuts, and popcorn. What foods should I eat? Fruits Berries. Pears. Apples. Oranges. Avocado. Prunes and raisins. Dried figs. Vegetables Sweet potatoes. Spinach. Kale. Artichokes. Cabbage. Broccoli. Cauliflower.Green peas. Carrots. Squash. Grains Whole-grain breads. Multigrain cereal. Oats and oatmeal. Brown rice. Barley.Bulgur wheat. [place]. Quinoa. Bran muffins. Popcorn. Rye wafer crackers. Meats and other proteins Navy beans, kidney beans, and pinto beans. Soybeans. Split peas. Lentils. Nutsand seeds. Dairy Fiber-fortified yogurt. Beverages Fiber-fortified soy milk. Fiber-fortified orange juice. Other foods Fiber bars. The items listed above may not be a complete list of recommended foods and beverages. Contact a dietitian for more information. What foods should I avoid? Fruits Fruit juice. Cooked, strained fruit. Vegetables Fried potatoes. Canned vegetables. Well-cooked vegetables. Grains White bread. Pasta made with refined flour. White rice. Meats and other proteins Fatty cuts of meat. Fried chicken or fried fish. Dairy Milk. Yogurt. Cream cheese. Sour cream. Fats and oils Butters. Beverages Soft drinks. Other foods Cakes and pastries. The items listed above may not be a complete list of foods and beverages to avoid. Talk with your dietitian about what choices are best for you. Summary Fiber  is a type of carbohydrate. It is found in foods such as fruits, vegetables, whole grains, and beans. A high-fiber diet has many benefits. It can help to prevent constipation, lower blood cholesterol, aid weight loss, and reduce your risk of heart disease, diabetes, and certain cancers. Increase your intake of fiber gradually. Increasing fiber too quickly may cause cramping, bloating, and gas. Drink plenty of water while you increase the amount of fiber you consume. The best sources of fiber include whole fruits and vegetables, whole grains, nuts, seeds, and beans. This information is not intended to replace advice given to you by your health care provider. Make sure you discuss any questions you have with your healthcare provider. Document Revised: 02/26/2020 Document Reviewed: 02/26/2020 Elsevier Patient Education  2022 [employer].

## 2021-04-29 ENCOUNTER — Other Ambulatory Visit: Payer: Self-pay | Admitting: Interventional Cardiology

## 2021-05-02 NOTE — Telephone Encounter (Signed)
Rx(s) sent to pharmacy electronically.  

## 2021-05-05 DIAGNOSIS — M25561 Pain in right knee: Secondary | ICD-10-CM | POA: Diagnosis not present

## 2021-05-05 DIAGNOSIS — M1711 Unilateral primary osteoarthritis, right knee: Secondary | ICD-10-CM | POA: Diagnosis not present

## 2021-05-26 ENCOUNTER — Telehealth: Payer: Self-pay | Admitting: Interventional Cardiology

## 2021-05-26 NOTE — Telephone Encounter (Signed)
Called patient and gave him the phone number for Eliquis patient assistance. He thanked me for the call.

## 2021-05-26 NOTE — Telephone Encounter (Signed)
Patient was calling in to get assistant with getting his medication for ELIQUIS 5 MG TABS tablet. Patient states that its too much for him to pay and was told that we had program that with assist him in getting the medication. Please advise

## 2021-06-02 DIAGNOSIS — L821 Other seborrheic keratosis: Secondary | ICD-10-CM | POA: Diagnosis not present

## 2021-07-28 DIAGNOSIS — N529 Male erectile dysfunction, unspecified: Secondary | ICD-10-CM | POA: Diagnosis not present

## 2021-07-28 DIAGNOSIS — R972 Elevated prostate specific antigen [PSA]: Secondary | ICD-10-CM | POA: Diagnosis not present

## 2021-07-28 DIAGNOSIS — N138 Other obstructive and reflux uropathy: Secondary | ICD-10-CM | POA: Diagnosis not present

## 2021-07-28 DIAGNOSIS — N401 Enlarged prostate with lower urinary tract symptoms: Secondary | ICD-10-CM | POA: Diagnosis not present

## 2021-10-22 ENCOUNTER — Other Ambulatory Visit: Payer: Self-pay | Admitting: Interventional Cardiology

## 2021-12-19 ENCOUNTER — Encounter: Payer: Self-pay | Admitting: Internal Medicine

## 2021-12-19 ENCOUNTER — Other Ambulatory Visit: Payer: Self-pay

## 2021-12-19 ENCOUNTER — Ambulatory Visit (INDEPENDENT_AMBULATORY_CARE_PROVIDER_SITE_OTHER): Payer: PPO | Admitting: Internal Medicine

## 2021-12-19 VITALS — BP 98/48 | HR 82 | Temp 99.3°F

## 2021-12-19 DIAGNOSIS — Z28310 Unvaccinated for covid-19: Secondary | ICD-10-CM

## 2021-12-19 DIAGNOSIS — J029 Acute pharyngitis, unspecified: Secondary | ICD-10-CM | POA: Diagnosis not present

## 2021-12-19 DIAGNOSIS — J3489 Other specified disorders of nose and nasal sinuses: Secondary | ICD-10-CM

## 2021-12-19 DIAGNOSIS — R5383 Other fatigue: Secondary | ICD-10-CM | POA: Diagnosis not present

## 2021-12-19 DIAGNOSIS — U071 COVID-19: Secondary | ICD-10-CM

## 2021-12-19 DIAGNOSIS — Z8673 Personal history of transient ischemic attack (TIA), and cerebral infarction without residual deficits: Secondary | ICD-10-CM

## 2021-12-19 DIAGNOSIS — N400 Enlarged prostate without lower urinary tract symptoms: Secondary | ICD-10-CM

## 2021-12-19 DIAGNOSIS — Z8679 Personal history of other diseases of the circulatory system: Secondary | ICD-10-CM | POA: Diagnosis not present

## 2021-12-19 DIAGNOSIS — E782 Mixed hyperlipidemia: Secondary | ICD-10-CM

## 2021-12-19 LAB — POC COVID19 BINAXNOW: SARS Coronavirus 2 Ag: POSITIVE — AB

## 2021-12-19 MED ORDER — BENZONATATE 100 MG PO CAPS: 100.0000 mg | ORAL_CAPSULE | Freq: Two times a day (BID) | ORAL | 0 refills | Status: DC | PRN

## 2021-12-19 MED ORDER — AZITHROMYCIN 250 MG PO TABS: ORAL_TABLET | ORAL | 0 refills | Status: AC

## 2021-12-19 NOTE — Patient Instructions (Addendum)
He cannot take Paxlovid with Eliquis due to an interaction. He was given Zithromax Z-pak and Tessalon perles. Quarantine at home for 5 days.

## 2021-12-19 NOTE — Progress Notes (Signed)
° °  Subjective:    Patient ID: [name], male    DOB: 1941-07-02, 81 y.o.   MRN: [id number]  HPI 81 year old Male walked in today for labs in anticipation of upcoming CPE and Medicare wellness visit. He advised  [place] he had sore throat and runny nose. Our records indicate he has not had Covid vaccines. He lives alone. Says he did not feel very ill but thought he just had a cold. It did not occur to him that he could have Covid. His rapid Covid test here was positive. His labs and CPE have been cancelled for this week.  He has a history of atrial flutter and is on Eliquis per Dr. Irish Lack and should be seeing him again soon. Currently no appt scheduled with Cardiology.  He is a widower and resides alone.  He drives.  He was hospitalized with atypical pneumonia and atrial flutter October 29 through September 07, 2020.  He had a TEE conversion while hospitalized and was treated with IV antibiotics for pneumonia.  Review of Systems resides alone.  No complaint of fever, shaking chills, nausea or vomiting.     Objective:   Physical Exam Blood pressure 98/48 temperature 99.3 degrees pulse 82 pulse oximetry 98%  Skin: Warm and dry.  Pharynx slightly injected.  TMs clear.  Neck supple.  Chest clear.  Rapid COVID test is positive       Assessment & Plan:  Acute COVID-19 virus infection  No hypoxia  Mild hypotension-suspect volume depletion from fasting.  History of atrial flutter treated with anticoagulation  Plan: Paxlovid was considered but it has an interaction with Eliquis.  He will be treated with Zithromax Z-PAK 2 tabs day 1 followed by 1 tab days 2 through 5.  He may take Tessalon Perles 100 mg up to 3 times daily as needed for cough.  He is to quarantine at home for 5 days, rest and drink plenty of fluids and contact me if he develops hypoxia/shortness of breath.  His physical exam for later this week has been canceled as has his lab work for today.  We will need to reschedule this.   He also needs to contact cardiology regarding follow-up soon.

## 2021-12-22 ENCOUNTER — Ambulatory Visit: Payer: PPO | Admitting: Internal Medicine

## 2021-12-22 ENCOUNTER — Encounter: Payer: Self-pay | Admitting: Interventional Cardiology

## 2021-12-23 ENCOUNTER — Telehealth: Payer: Self-pay | Admitting: Internal Medicine

## 2021-12-23 NOTE — Telephone Encounter (Signed)
[facility] results to [facility] [phone], phone [phone]  +COVID 12/19/2021

## 2021-12-30 DIAGNOSIS — Z20828 Contact with and (suspected) exposure to other viral communicable diseases: Secondary | ICD-10-CM | POA: Diagnosis not present

## 2022-01-02 ENCOUNTER — Encounter (INDEPENDENT_AMBULATORY_CARE_PROVIDER_SITE_OTHER): Payer: Self-pay | Admitting: Ophthalmology

## 2022-01-02 ENCOUNTER — Ambulatory Visit (INDEPENDENT_AMBULATORY_CARE_PROVIDER_SITE_OTHER): Payer: PPO | Admitting: Ophthalmology

## 2022-01-02 ENCOUNTER — Other Ambulatory Visit: Payer: Self-pay

## 2022-01-02 DIAGNOSIS — T8522XD Displacement of intraocular lens, subsequent encounter: Secondary | ICD-10-CM

## 2022-01-02 DIAGNOSIS — H35352 Cystoid macular degeneration, left eye: Secondary | ICD-10-CM | POA: Diagnosis not present

## 2022-01-02 DIAGNOSIS — Z8669 Personal history of other diseases of the nervous system and sense organs: Secondary | ICD-10-CM

## 2022-01-02 NOTE — Assessment & Plan Note (Signed)
Stable OU for 15 to 17 years post retinal detachment repair

## 2022-01-02 NOTE — Assessment & Plan Note (Signed)
OS, looks great status post suture fixation of all PMMA lens now for 5 years

## 2022-01-02 NOTE — Assessment & Plan Note (Signed)
OS no change over time, resolved

## 2022-01-02 NOTE — Progress Notes (Signed)
01/02/2022     CHIEF COMPLAINT Patient presents for  Chief Complaint  Patient presents with   Cystoid Macular Edema      HISTORY OF PRESENT ILLNESS: [name] is a 81 y.o. male who presents to the clinic today for:   HPI   1 yr fu ou oct fp. Patient states vision is stable and unchanged since last visit. Denies any new floaters or FOL.  In the past with cme,,, none seen today , on no topical medication   Last edited by Hurman Horn, MD on 01/02/2022 11:37 AM.      Referring physician: [name], MD [id number],  [place] [id number]  HISTORICAL INFORMATION:   Selected notes from the MEDICAL RECORD NUMBER    Lab Results  Component Value Date   HGBA1C 5.1 09/07/2020     CURRENT MEDICATIONS: No current outpatient medications on file. (Ophthalmic Drugs)   No current facility-administered medications for this visit. (Ophthalmic Drugs)   Current Outpatient Medications (Other)  Medication Sig   benzonatate (TESSALON) 100 MG capsule Take 1 capsule (100 mg total) by mouth 2 (two) times daily as needed for cough.   diltiazem (CARDIZEM CD) 300 MG 24 hr capsule TAKE 1 CAPSULE BY MOUTH EVERY DAY   ELIQUIS 5 MG TABS tablet TAKE 1 TABLET BY MOUTH TWICE A DAY   famotidine (PEPCID) 20 MG tablet Take 1 tablet (20 mg total) by mouth daily.   furosemide (LASIX) 20 MG tablet TAKE 1 TABLET BY MOUTH EVERY DAY   metoprolol succinate (TOPROL-XL) 100 MG 24 hr tablet TAKE 1 TABLET BY MOUTH EVERY DAY   rosuvastatin (CRESTOR) 5 MG tablet Take 1 tablet (5 mg total) by mouth at bedtime.   No current facility-administered medications for this visit. (Other)      REVIEW OF SYSTEMS: ROS   Negative for: Constitutional, Gastrointestinal, Neurological, Skin, Genitourinary, Musculoskeletal, HENT, Endocrine, Cardiovascular, Eyes, Respiratory, Psychiatric, Allergic/Imm, Heme/Lymph Last edited by Hurman Horn, MD on 01/02/2022 11:37 AM.       ALLERGIES Allergies   Allergen Reactions   Wellbutrin [Bupropion Hcl] Rash    PAST MEDICAL HISTORY Past Medical History:  Diagnosis Date   Allergy    seasonal   BPH (benign prostatic hypertrophy)    Cataract    bilateral-removed "back in 90's"   Eczema    Hyperlipidemia    Peyronie's disease    Retinal tear 2000's   bilateral-surgery to repair.   Urticaria    Past Surgical History:  Procedure Laterality Date   BUBBLE STUDY  09/06/2020   Procedure: BUBBLE STUDY;  Surgeon: Dixie Dials, MD;  Location: [facility];  Service: Cardiovascular;;   CARDIOVERSION N/A 09/06/2020   Procedure: CARDIOVERSION;  Surgeon: Dixie Dials, MD;  Location: [facility];  Service: Cardiovascular;  Laterality: N/A;   COLONOSCOPY     EYE SURGERY Bilateral 06/08   retinal detachment OS   EYE SURGERY Bilateral 1994   cataract OU   TEE WITHOUT CARDIOVERSION N/A 09/06/2020   Procedure: TRANSESOPHAGEAL ECHOCARDIOGRAM (TEE);  Surgeon: Dixie Dials, MD;  Location: [facility] ENDOSCOPY;  Service: Cardiovascular;  Laterality: N/A;   TONSILLECTOMY      FAMILY HISTORY Family History  Problem Relation Age of Onset   Cancer Father        back muscle    Other Father        pituatary tumor   Lung cancer Father    Colon polyps Mother    High blood pressure  Brother    Colon cancer Neg Hx    Esophageal cancer Neg Hx    Rectal cancer Neg Hx    Stomach cancer Neg Hx     SOCIAL HISTORY Social History   Tobacco Use   Smoking status: Former    Years: 20.00    Types: Cigarettes    Quit date: 11/06/1970    Years since quitting: 51.1   Smokeless tobacco: Never  Vaping Use   Vaping Use: Never used  Substance Use Topics   Alcohol use: Not Currently   Drug use: No         OPHTHALMIC EXAM:  Base Eye Exam     Visual Acuity (ETDRS)       Right Left   Dist cc 20/30 +1 20/30 -1    Correction: Glasses         Tonometry (Tonopen, 10:51 AM)       Right Left   Pressure 13 15         Pupils       Pupils Dark Light  Shape React APD   Right PERRL 5 4 Round Brisk None   Left PERRL 4 4 Irregular  None         Visual Fields (Counting fingers)       Left Right    Full Full         Extraocular Movement       Right Left    Full, Ortho Full, Ortho         Neuro/Psych     Oriented x3: Yes   Mood/Affect: Normal         Dilation     Both eyes: 1.0% Mydriacyl, 2.5% Phenylephrine @ 10:48 AM           Slit Lamp and Fundus Exam     External Exam       Right Left   External Normal Normal         Slit Lamp Exam       Right Left   Lids/Lashes Normal Normal   Conjunctiva/Sclera White and quiet White and quiet, no no exposed   Cornea Clear Clear   Anterior Chamber Deep and quiet Deep and quiet   Iris Round and reactive Round and reactive   Lens Centered posterior chamber intraocular lens, in the capsule bag Centered posterior chamber intraocular lens, no capsule remains sutured PCIOL   Anterior Vitreous Normal Normal         Fundus Exam       Right Left   Posterior Vitreous Clear, avitric Clear, avitric   Disc Normal, mild.  Pap atrophy Normal, mild.  Pap atrophy   C/D Ratio 0.2 0.1   Macula Normal Normal   Vessels Normal Normal   Periphery Good buckle peripherally, chorioretinal scarring, history of RD, a attached 360 Good scleral buckle peripherally, good chorioretinal scars            IMAGING AND PROCEDURES  Imaging and Procedures for 01/02/22  Color Fundus Photography Optos - OU - Both Eyes       Right Eye Disc findings include normal observations. Macula : normal observations. Vessels : normal observations.   Left Eye Progression has been stable. Disc findings include normal observations. Macula : normal observations. Vessels : normal observations.   Notes OU with good scleral buckle, chorioretinal scar from laser retinopexy and reattachment     OCT, Retina - OU - Both Eyes       Right  Eye Quality was good. Scan locations included subfoveal.  Central Foveal Thickness: 356. Progression has been stable.   Left Eye Quality was good. Scan locations included subfoveal. Central Foveal Thickness: 355. Progression has been stable. Findings include abnormal foveal contour.   Notes Mild chronic retinal thickening in the macular region stable over time, OS and OD similar             ASSESSMENT/PLAN:  Dislocated intraocular lens, subsequent encounter OS, looks great status post suture fixation of all PMMA lens now for 5 years  Cystoid macular edema of left eye OS no change over time, resolved  History of retinal detachment Stable OU for 15 to 17 years post retinal detachment repair     ICD-10-CM   1. Cystoid macular edema of left eye  H35.352 Color Fundus Photography Optos - OU - Both Eyes    OCT, Retina - OU - Both Eyes    2. Dislocated intraocular lens, subsequent encounter  T85.22XD     3. History of retinal detachment  Z86.69       1.  OU, stable, history of retinal detachment, no new findings  2.  OS with history of dislocated lens, repaired via removal of dislocated IOL and suture fixation, Gore-Tex of all PMMA lens posterior chamber  3.  Ophthalmic Meds Ordered this visit:  No orders of the defined types were placed in this encounter.      Return in about 1 year (around 01/02/2023) for DILATE OU, COLOR FP, OCT.  There are no Patient Instructions on file for this visit.   Explained the diagnoses, plan, and follow up with the patient and they expressed understanding.  Patient expressed understanding of the importance of proper follow up care.   Clent Demark Inza Mikrut M.D. Diseases & Surgery of the Retina and Vitreous [facility] [place] 01/02/22     Abbreviations: M myopia (nearsighted); A astigmatism; H hyperopia (farsighted); P presbyopia; Mrx spectacle prescription;  CTL contact lenses; OD right eye; OS left eye; OU both eyes  XT exotropia; ET esotropia; PEK punctate epithelial keratitis; PEE  punctate epithelial erosions; DES dry eye syndrome; MGD meibomian gland dysfunction; ATs artificial tears; PFAT's preservative free artificial tears; [place] nuclear sclerotic cataract; PSC posterior subcapsular cataract; ERM epi-retinal membrane; PVD posterior vitreous detachment; RD retinal detachment; DM diabetes mellitus; DR diabetic retinopathy; NPDR non-proliferative diabetic retinopathy; PDR proliferative diabetic retinopathy; CSME clinically significant macular edema; DME diabetic macular edema; dbh dot blot hemorrhages; CWS cotton wool spot; POAG primary open angle glaucoma; C/D cup-to-disc ratio; HVF humphrey visual field; GVF goldmann visual field; OCT optical coherence tomography; IOP intraocular pressure; BRVO Branch retinal vein occlusion; CRVO central retinal vein occlusion; CRAO central retinal artery occlusion; BRAO branch retinal artery occlusion; RT retinal tear; SB scleral buckle; PPV pars plana vitrectomy; VH Vitreous hemorrhage; PRP panretinal laser photocoagulation; IVK intravitreal kenalog; VMT vitreomacular traction; MH Macular hole;  NVD neovascularization of the disc; NVE neovascularization elsewhere; AREDS age related eye disease study; ARMD age related macular degeneration; POAG primary open angle glaucoma; EBMD epithelial/anterior basement membrane dystrophy; ACIOL anterior chamber intraocular lens; IOL intraocular lens; PCIOL posterior chamber intraocular lens; Phaco/IOL phacoemulsification with intraocular lens placement; [place] photorefractive keratectomy; LASIK laser assisted in situ keratomileusis; HTN hypertension; DM diabetes mellitus; COPD chronic obstructive pulmonary disease

## 2022-01-09 ENCOUNTER — Other Ambulatory Visit: Payer: Self-pay | Admitting: Interventional Cardiology

## 2022-01-09 ENCOUNTER — Other Ambulatory Visit: Payer: Self-pay

## 2022-01-09 ENCOUNTER — Other Ambulatory Visit: Payer: PPO | Admitting: *Deleted

## 2022-01-09 DIAGNOSIS — I4892 Unspecified atrial flutter: Secondary | ICD-10-CM

## 2022-01-09 NOTE — Telephone Encounter (Signed)
Called and spoke to pt and he is able to come in today 3/6 for blood work. Appointment made. ?Pt stated that he has about 1 1/2 weeks left of Eliquis   ?

## 2022-01-09 NOTE — Telephone Encounter (Signed)
Prescription refill request for Eliquis received. ?Indication: a flutter ?Last office visit: varanasi. 04/21/2021 ?Scr: 0.90, 12/16/2020 ?Age: 81 yo  ?Weight: 81.7 kg  ? ?Pt is overdue for blood work.  Pt I scheduled for blood work with his PCP in June on 2023.  ?

## 2022-01-10 LAB — BASIC METABOLIC PANEL
BUN/Creatinine Ratio: 16 (ref 10–24)
BUN: 15 mg/dL (ref 8–27)
CO2: 26 mmol/L (ref 20–29)
Calcium: 9.2 mg/dL (ref 8.6–10.2)
Chloride: 105 mmol/L (ref 96–106)
Creatinine, Ser: 0.91 mg/dL (ref 0.76–1.27)
Glucose: 80 mg/dL (ref 70–99)
Potassium: 4.4 mmol/L (ref 3.5–5.2)
Sodium: 145 mmol/L — ABNORMAL HIGH (ref 134–144)
eGFR: 85 mL/min/{1.73_m2} (ref >59)

## 2022-01-10 LAB — CBC
Hematocrit: 40.6 % (ref 37.5–51.0)
Hemoglobin: 13.8 g/dL (ref 13.0–17.7)
MCH: 30 pg (ref 26.6–33.0)
MCHC: 34 g/dL (ref 31.5–35.7)
MCV: 88 fL (ref 79–97)
Platelets: 259 10*3/uL (ref 150–450)
RBC: 4.6 x10E6/uL (ref 4.14–5.80)
RDW: 13.1 % (ref 11.6–15.4)
WBC: 5.2 10*3/uL (ref 3.4–10.8)

## 2022-01-10 NOTE — Telephone Encounter (Signed)
Pt had labwork done 01/09/22 Creat 0.91, Hgb 13.8/Hct 40.6.  Age 81, weight 81.7, based on specified criteria pt is on appropriate dosage of Eliquis '5mg'$  BID for aflutter.  Will refill rx.  ?

## 2022-01-11 ENCOUNTER — Encounter: Payer: Self-pay | Admitting: Interventional Cardiology

## 2022-01-11 ENCOUNTER — Telehealth: Payer: Self-pay | Admitting: Internal Medicine

## 2022-01-11 NOTE — Telephone Encounter (Signed)
[name] ?[phone] ? ?[name] called to say he still has a lot of phlegm draining in his throat and in his head. His right ear feels like it has water in it. No Cough, No Fever. He said he had gone by an urgent care and tested negative now for COVID. Would you like for me to schedule office visit so you can look in his ears and listen to chest. ?

## 2022-01-12 ENCOUNTER — Encounter: Payer: Self-pay | Admitting: Internal Medicine

## 2022-01-12 ENCOUNTER — Ambulatory Visit (INDEPENDENT_AMBULATORY_CARE_PROVIDER_SITE_OTHER): Payer: PPO | Admitting: Internal Medicine

## 2022-01-12 ENCOUNTER — Other Ambulatory Visit: Payer: Self-pay

## 2022-01-12 VITALS — BP 128/62 | HR 69 | Temp 97.4°F

## 2022-01-12 DIAGNOSIS — J22 Unspecified acute lower respiratory infection: Secondary | ICD-10-CM

## 2022-01-12 DIAGNOSIS — Z8616 Personal history of COVID-19: Secondary | ICD-10-CM | POA: Diagnosis not present

## 2022-01-12 DIAGNOSIS — Z8673 Personal history of transient ischemic attack (TIA), and cerebral infarction without residual deficits: Secondary | ICD-10-CM

## 2022-01-12 DIAGNOSIS — Z8679 Personal history of other diseases of the circulatory system: Secondary | ICD-10-CM

## 2022-01-12 DIAGNOSIS — J0100 Acute maxillary sinusitis, unspecified: Secondary | ICD-10-CM

## 2022-01-12 DIAGNOSIS — J329 Chronic sinusitis, unspecified: Secondary | ICD-10-CM

## 2022-01-12 MED ORDER — CEFTRIAXONE SODIUM 1 G IJ SOLR
1.0000 g | Freq: Once | INTRAMUSCULAR | Status: AC
  Administered 2022-01-12: 17:00:00 1 g via INTRAMUSCULAR

## 2022-01-12 MED ORDER — AZITHROMYCIN 250 MG PO TABS: ORAL_TABLET | ORAL | 0 refills | Status: AC

## 2022-01-12 MED ORDER — HYDROCODONE BIT-HOMATROP MBR 5-1.5 MG/5ML PO SOLN: 5.0000 mL | Freq: Three times a day (TID) | ORAL | 0 refills | Status: AC | PRN

## 2022-01-12 NOTE — Telephone Encounter (Signed)
scheduled

## 2022-01-12 NOTE — Progress Notes (Signed)
? ?  Subjective:  ? ? Patient ID: [name], male    DOB: Jan 04, 1941, 81 y.o.   MRN: [id number] ? ?HPI 81 year old Male seen with persistent cough and congestion. Dx with Covid-19 on February 2023.  Paxlovid was considered but it had an interaction with Eliquis.  He was subsequently treated with Zithromax and Tessalon Perles.  His pulse oximetry on February 13 was 98%. ? ?He was hospitalized October 29 through September 07, 2020 with supraventricular tachycardia and atypical pneumonia.  This was thought to be Mycoplasma pneumoniae.  He was tested for legionnaires disease but the test was negative.  He is now on Eliquis.  He had CT of the chest for follow-up on November 19 2020 showing no acute intrathoracic abnormality and complete resolution of bilateral pleural effusions. ? ?History of TIA in April 2021. ? ? ? ?Review of Systems Youngest brother died recently- he is not sure of cause of death. Was found deceased at home.  Patient has been trying to handle the estate.  We discussed this at length today.  Has been a bit stressful. ? ?Main complaint is a lot of postnasal drip/phlegm draining down his throat.  Right ear feels stopped up.  No cough and no fever.  He tested again for COVID-19 and is now negative.  He is asking for me to look in his ears and listen to his chest. ? ?   ?Objective:  ? Physical Exam ?Temperature 97.4 degrees with tympanic thermometer.  Pulse oximetry 99% on room air, blood pressure 128/62 ?Skin: Warm and dry.  Right ear slightly full and pink.  Left TM not inflamed or full.  Pharynx is clear.  Neck is supple.  Chest clear to auscultation without rales or wheezing. ? ? ?   ?Assessment & Plan:  ?History of COVID-19 virus infection with protracted postnasal drip and right serous otitis media ? ?Plan: 1 g IM Rocephin given in the office today.  Repeat Zithromax Z-PAK 2 tabs day 1 followed by 1 tab days 2 through 5.  Hycodan 1 teaspoon every 8 hours as needed for cough.  Rest and drink fluids and  call if not better in 2 weeks or sooner if worse. ? ?

## 2022-01-13 NOTE — Patient Instructions (Addendum)
Rocephin 1 g IM given in office today for acute maxillary sinusitis status post COVID-19 virus infection.  Take Zithromax Z-PAK again 2 tablets day 1 followed by 1 tab days 2 through 5.  May take Hycodan sparingly for cough 1 teaspoon every 8 hours if needed.  Continue to rest and drink fluids.  We are sorry to hear about the loss of your brother. ?

## 2022-01-20 ENCOUNTER — Other Ambulatory Visit: Payer: Self-pay | Admitting: Internal Medicine

## 2022-02-07 IMAGING — CT CT ANGIO CHEST
2 of 6 series · 18 of 36 positions shown · IV contrast (omnipaque)
Comparison: None.

CLINICAL DATA: Tachycardia, dyspnea

EXAM:
CT ANGIOGRAPHY CHEST WITH CONTRAST
TECHNIQUE: Multidetector CT imaging of the chest was performed using the
standard protocol during bolus administration of intravenous
contrast. Multiplanar CT image reconstructions and MIPs were
obtained to evaluate the vascular anatomy.
CONTRAST:  100mL OMNIPAQUE IOHEXOL 350 MG/ML SOLN

[Series 5: thins · axial · 0.73mm/px · z∈[+1344,+1642]mm · 17 of 336 slices shown]
[im 19/336  lung]
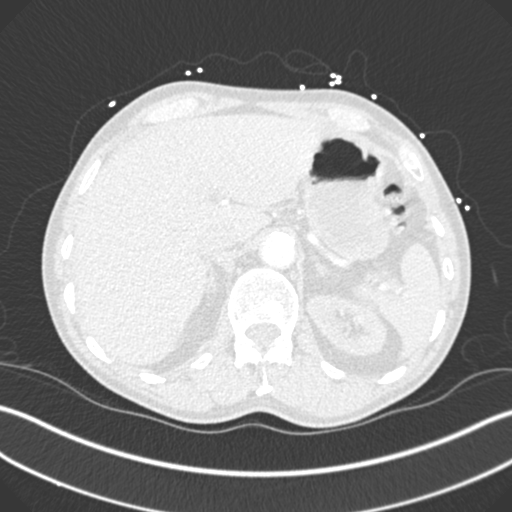
[im 38/336  mediastinal]
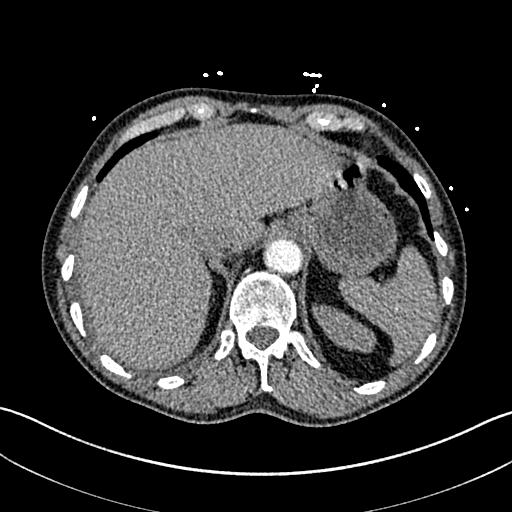
[im 56/336  lung]
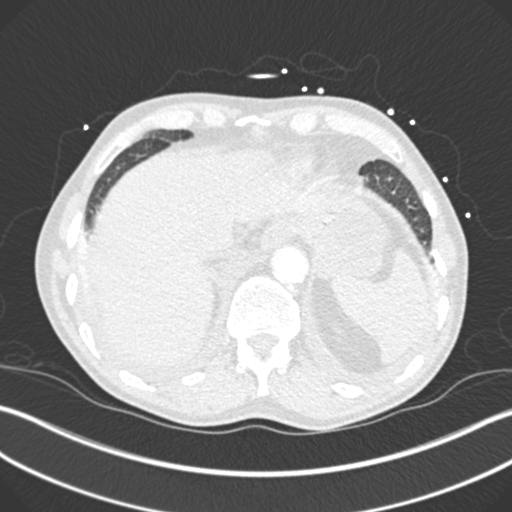
[im 75/336  mediastinal]
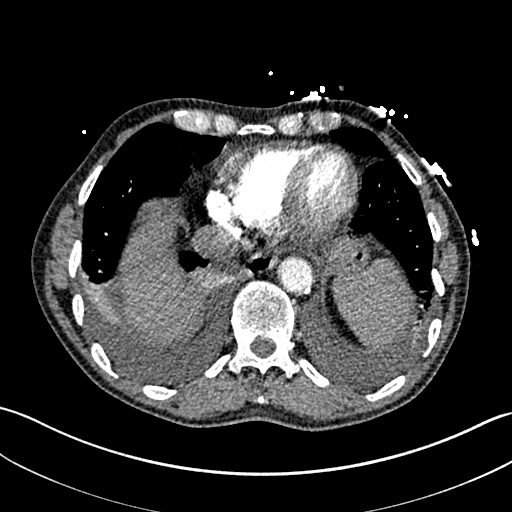
[im 94/336  lung]
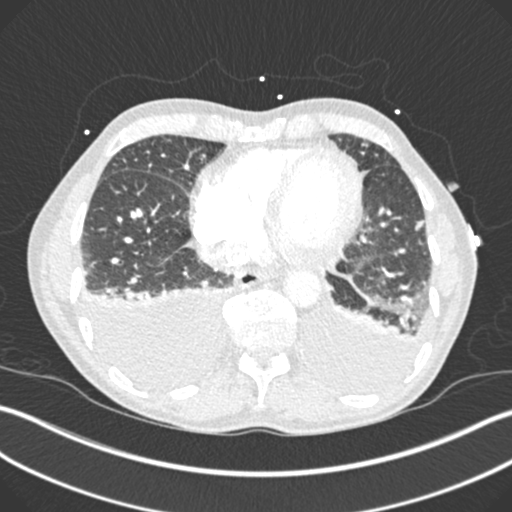
[im 112/336  mediastinal]
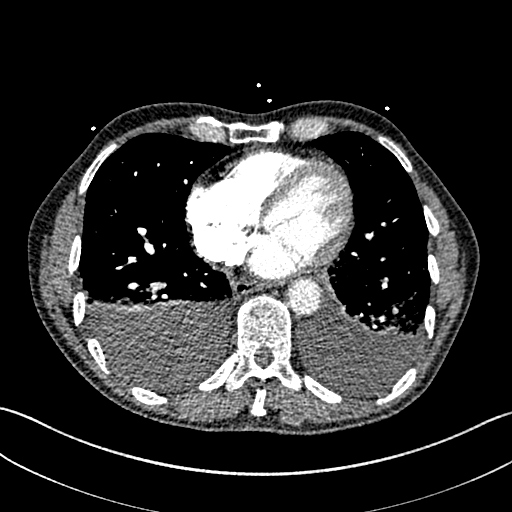
[im 131/336  lung]
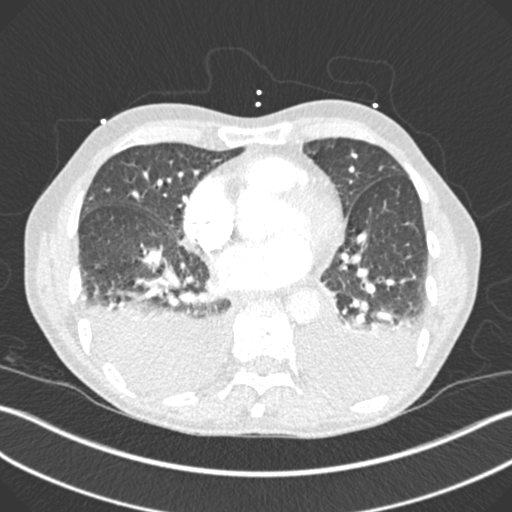
[im 149/336  mediastinal]
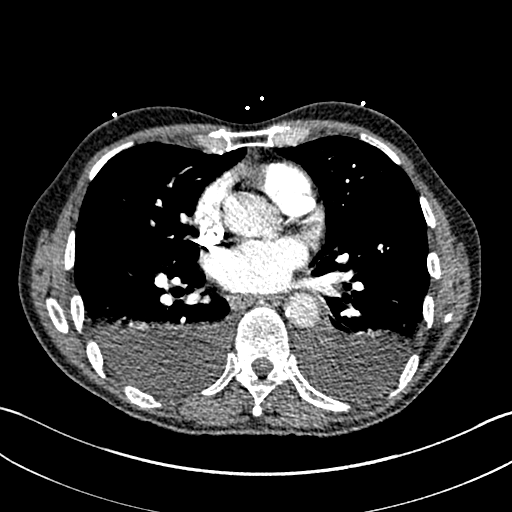
[im 168/336  lung]
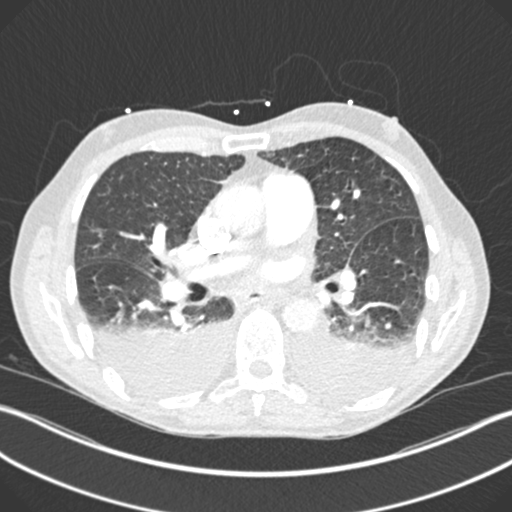
[im 187/336  mediastinal]
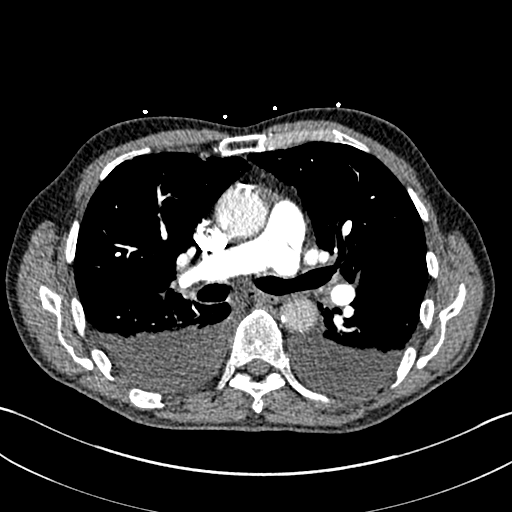
[im 205/336  lung]
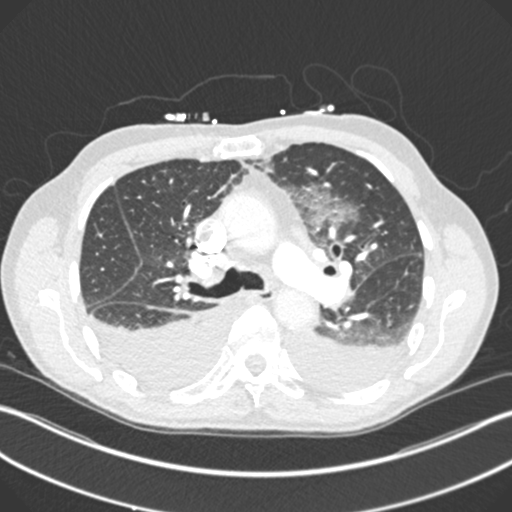
[im 224/336  mediastinal]
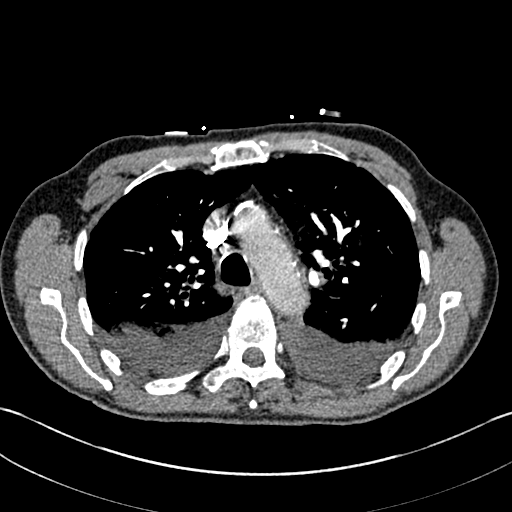
[im 242/336  lung]
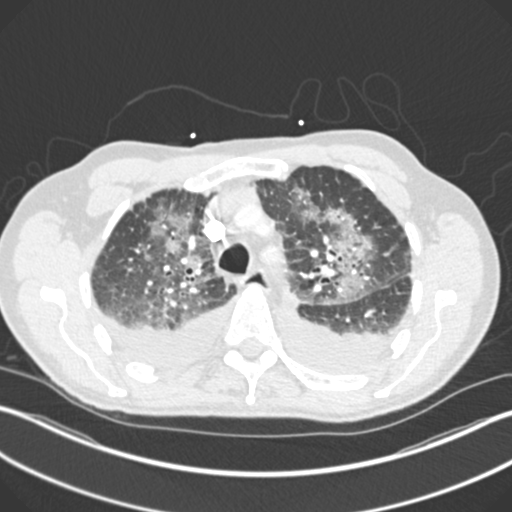
[im 261/336  mediastinal]
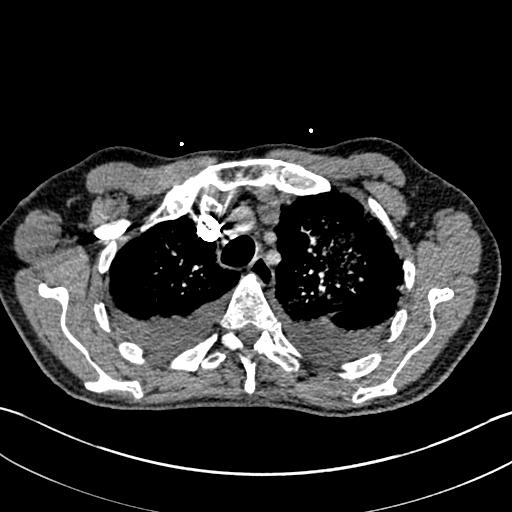
[im 280/336  lung]
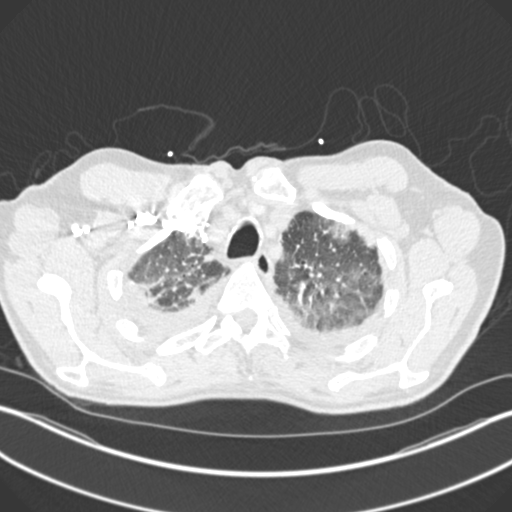
[im 298/336  mediastinal]
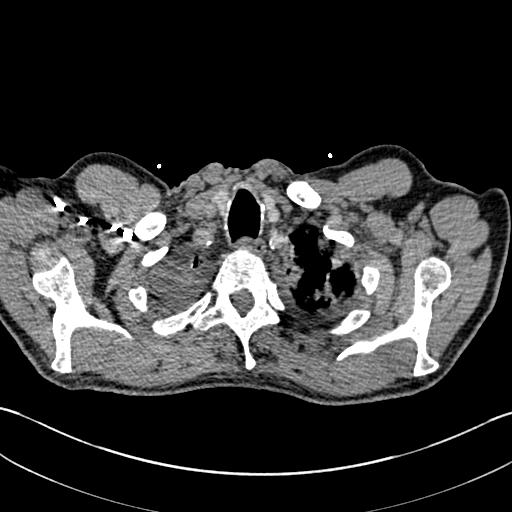
[im 317/336  lung]
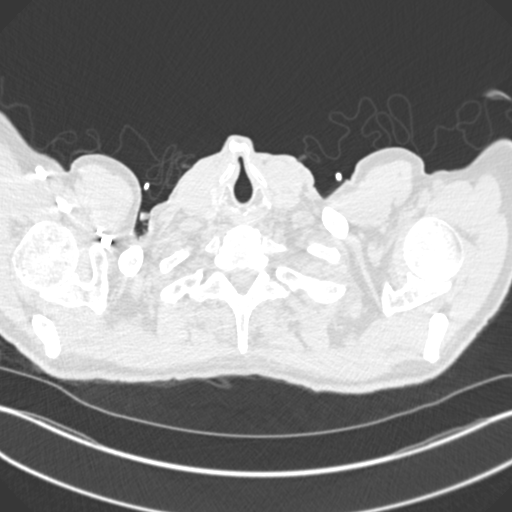

[Series 7: coronal mpr · coronal · 0.72mm/px · 1 of 132 slices shown]
[im 66/132  mediastinal]
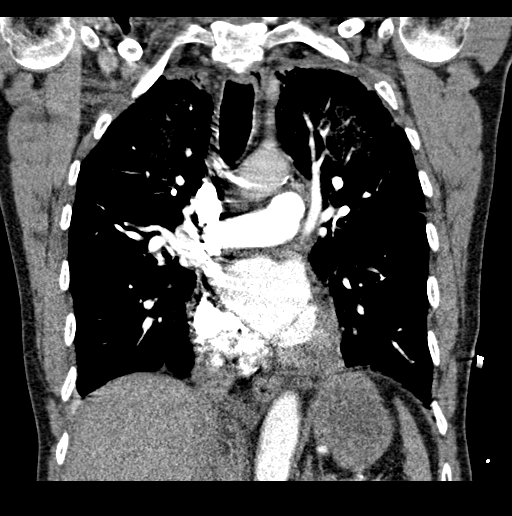

[18 of 36 positions shown; findings below may reference images not displayed]

FINDINGS: Cardiovascular: There is adequate opacification of the pulmonary
arterial tree. There is no intraluminal filling defect identified to
suggest acute pulmonary embolism. The central pulmonary arteries are
mildly enlarged in keeping with changes of pulmonary arterial
hypertension. Minimal coronary artery calcification. Global cardiac
size is within normal limits. The thoracic aorta is of normal
caliber. Mild atherosclerotic calcification is seen scattered
throughout the thoracic aorta.

Mediastinum/Nodes: Thyroid unremarkable. No pathologic thoracic
adenopathy. Esophagus unremarkable.

Lungs/Pleura: Moderate to large bilateral dependently layering
pleural effusions are present with compressive atelectasis of the
dependent lungs bilaterally. There is superimposed asymmetric
biapical ground-glass pulmonary infiltrate, more severe within the
left upper lobe, suspicious for acute infection the appropriate
clinical setting. There is associated bronchial wall thickening
asymmetrically involving the upper lobes bilaterally in keeping with
airway inflammation. A a 13 mm intrapleural pulmonary nodule may
represent a enlarged intrapulmonary lymph node. No central
obstructing lesion. No pneumothorax.

Upper Abdomen: No acute abnormality.

Musculoskeletal: No lytic or blastic bone lesions. No acute bone
abnormality.

Review of the MIP images confirms the above findings.
IMPRESSION: No pulmonary embolism.

Moderate to large bilateral dependent, bland appearing pleural
effusions with associated bilateral compressive atelectasis.

Superimposed biapical pulmonary infiltrates and associated bronchial
wall thickening suspicious for changes of atypical infection in the
appropriate clinical setting.

Aortic aneurysm NOS ([id number]).

## 2022-02-07 IMAGING — CR DG CHEST 2V
2 series · 2 of 2 positions shown · non-contrast
Comparison: 01/17/1999 [DATE]

CLINICAL DATA: Short of breath

EXAM:
CHEST - 2 VIEW

[w chest pa]
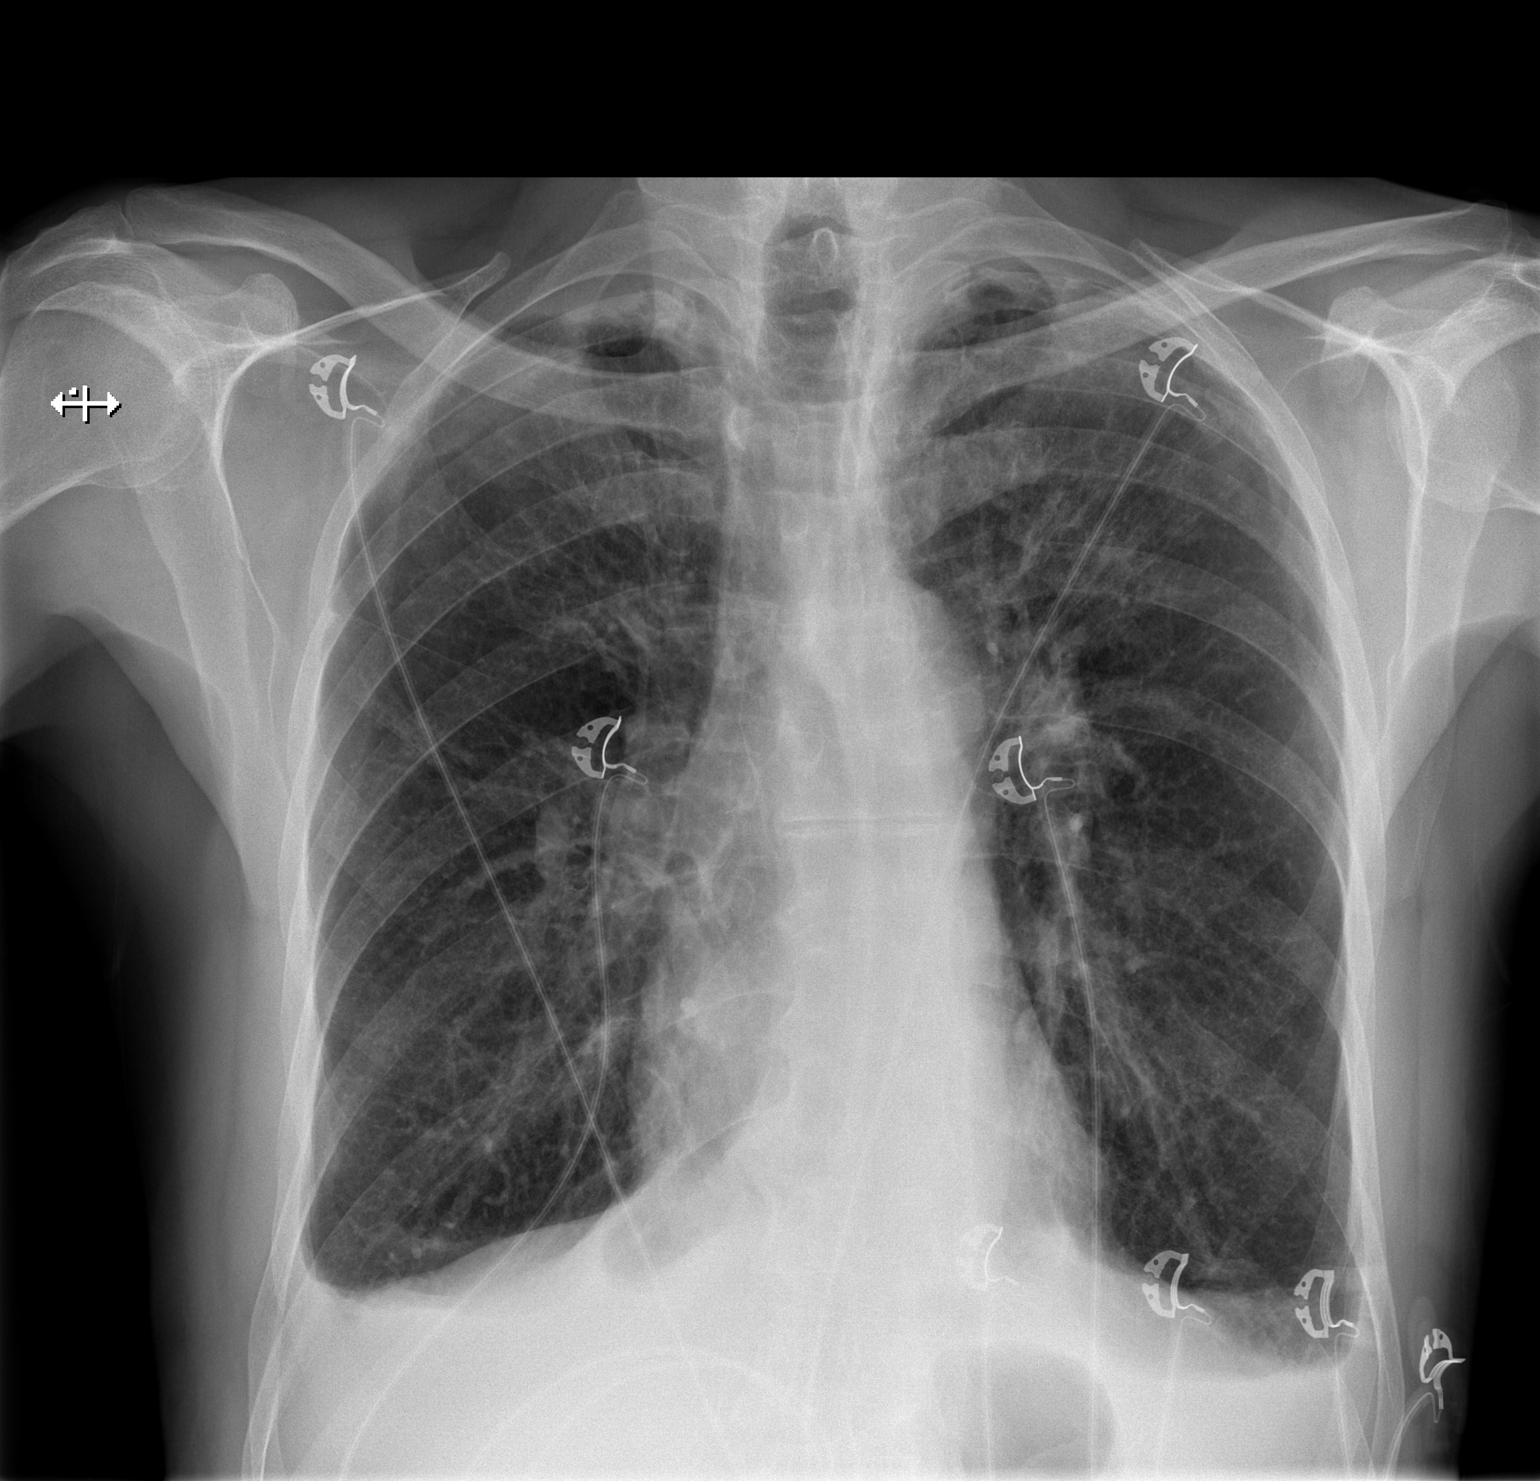

[w chest lat]
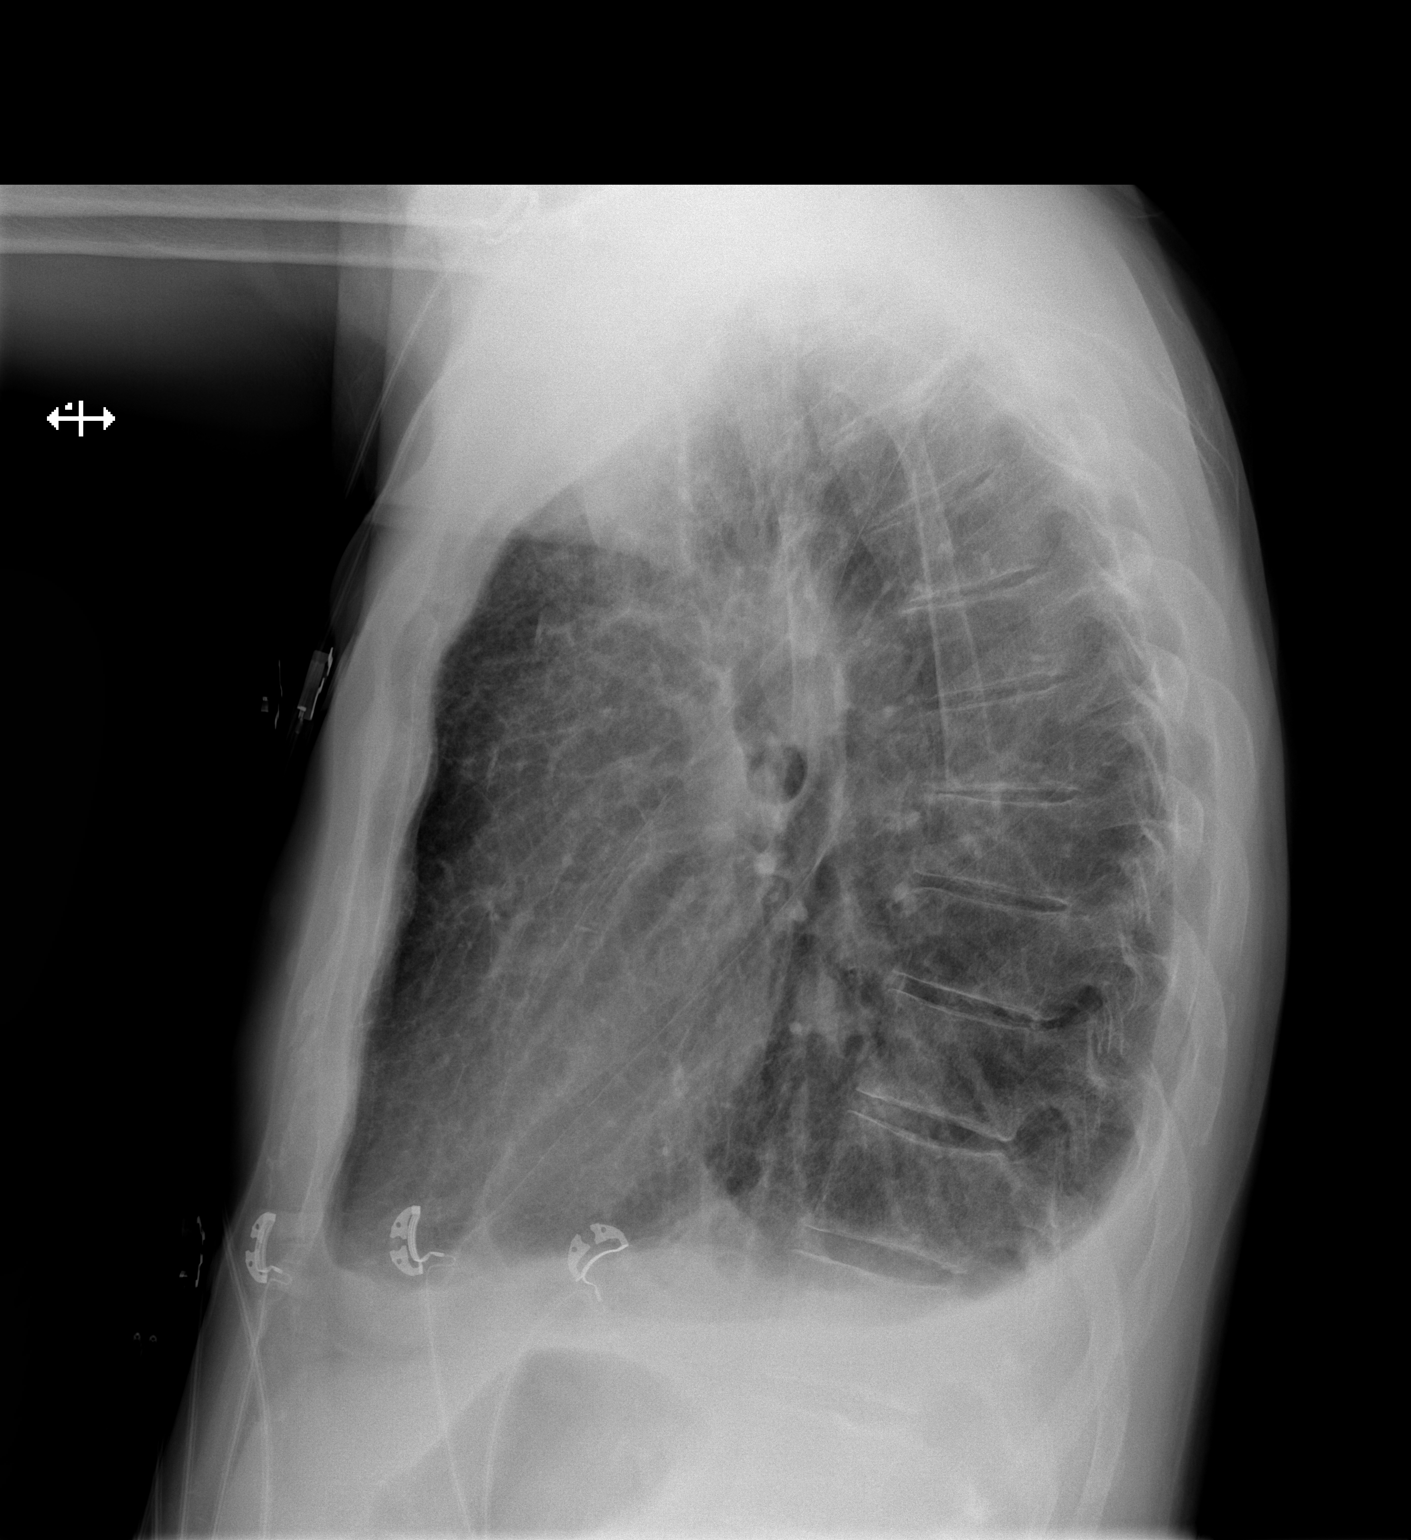

[2 of 2 positions shown; findings below may reference images not displayed]

FINDINGS: Small bilateral pleural effusions. No consolidation. Normal heart
size. No pneumothorax.
IMPRESSION: Small bilateral pleural effusions.

## 2022-02-09 IMAGING — DX DG CHEST 1V PORT
1 series · 1 of 1 positions shown · non-contrast
Comparison: 09/03/2020

CLINICAL DATA: Short of breath.

EXAM:
PORTABLE CHEST 1 VIEW

[chest ap]
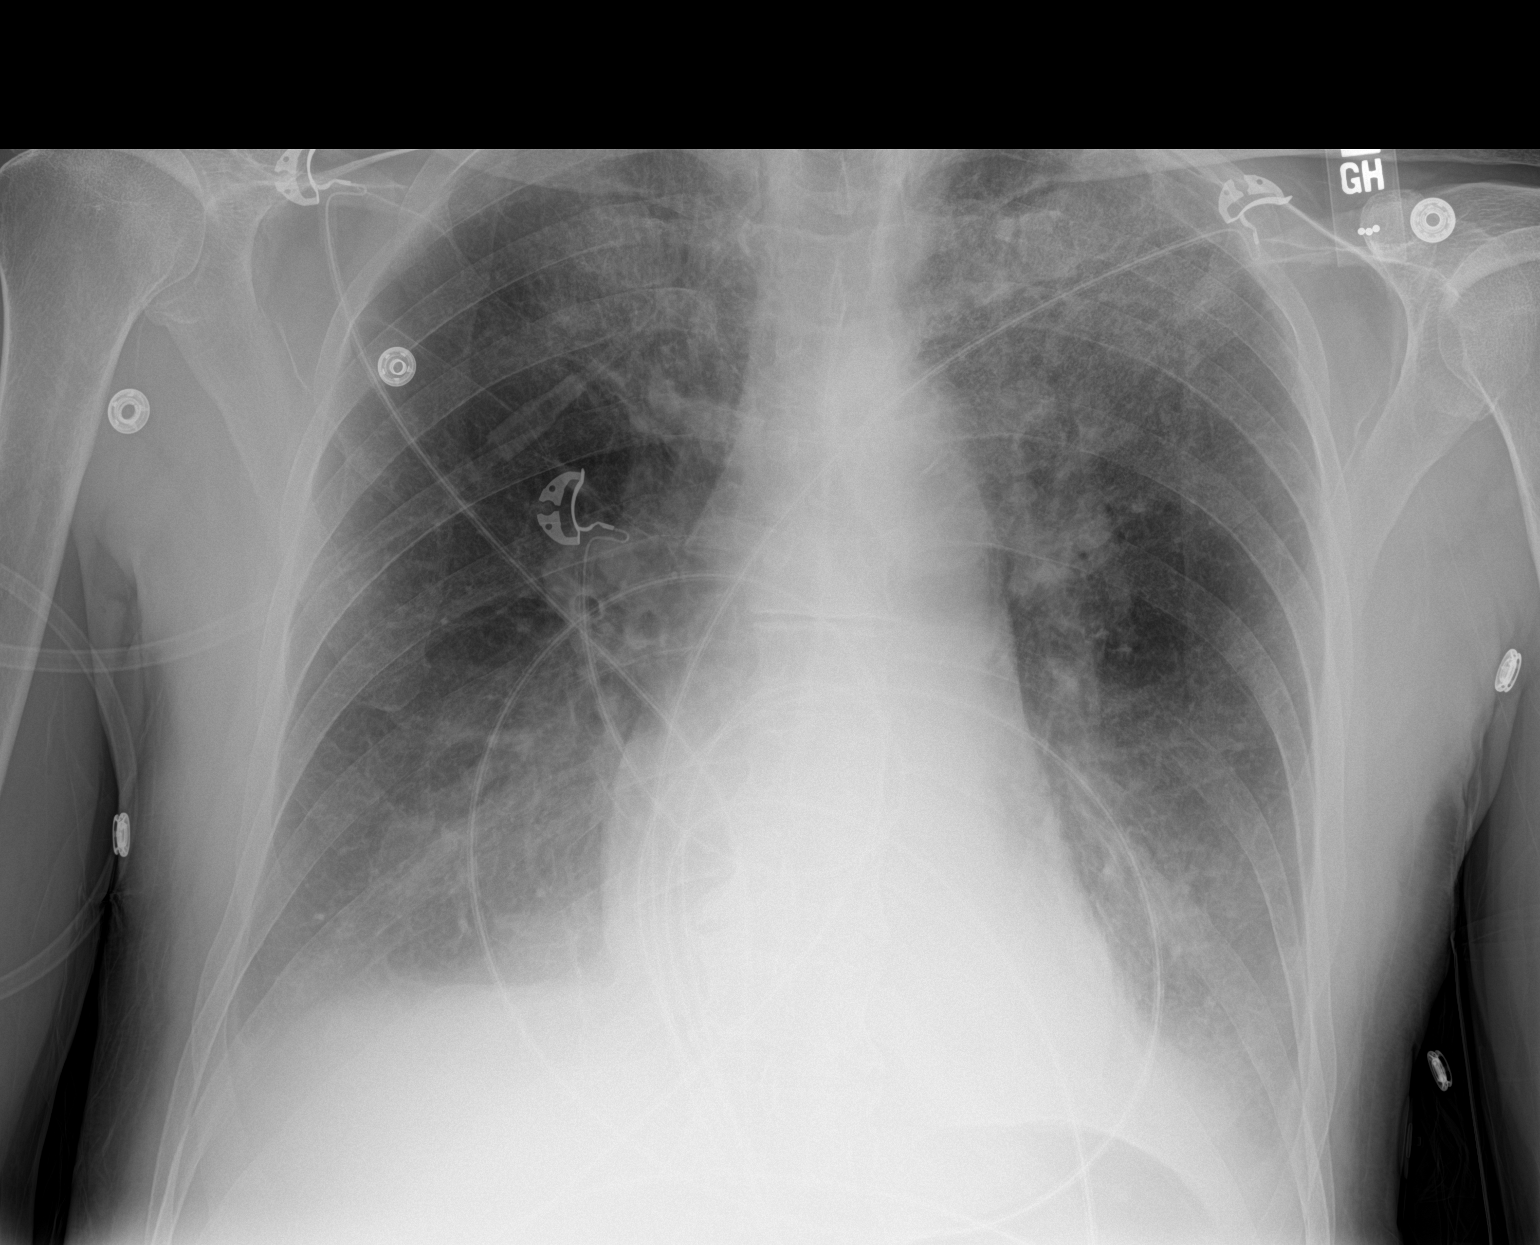

[1 of 1 positions shown; findings below may reference images not displayed]

FINDINGS: Airspace opacities in the upper and lower lungs bilaterally have
increased from the prior exam. Bilateral pleural effusions likely
increased in size. Lungs remain hyperexpanded. No pneumothorax.
IMPRESSION: 1. Worsening lung aeration compared to the prior study. Increased
airspace lung opacities noted in the upper and lower lungs
consistent with multifocal pneumonia. Mild increase in bilateral
pleural effusions.

## 2022-02-11 IMAGING — DX DG CHEST 1V PORT
1 series · 1 of 1 positions shown · non-contrast
Comparison: 09/05/2020.  CT 09/03/2020.

CLINICAL DATA: Hypoxia.

EXAM:
PORTABLE CHEST 1 VIEW

[chest ap]
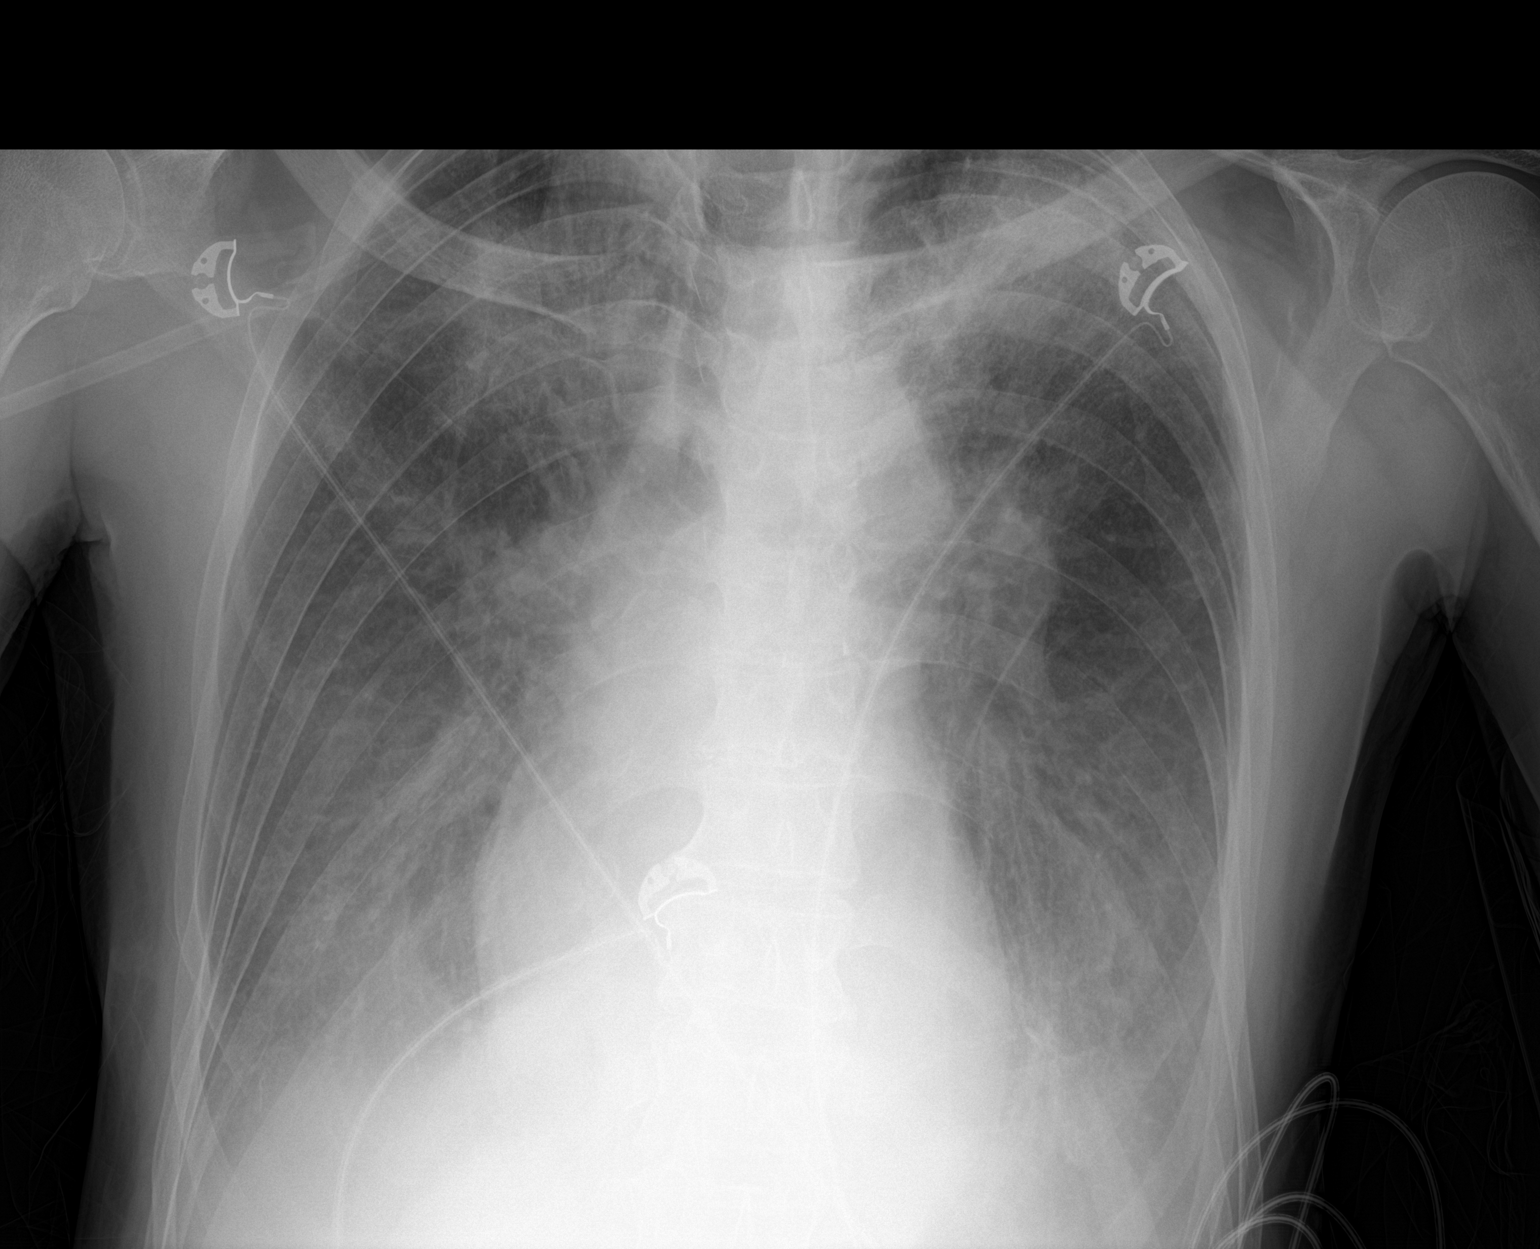

[1 of 1 positions shown; findings below may reference images not displayed]

FINDINGS: Heart size stable. Persistent bilateral upper lobe pulmonary
infiltrates. Persistent bibasilar atelectasis and bilateral pleural
effusions. Chest is unchanged from prior exam. No pneumothorax.
Biapical pleural thickening consistent scarring.
IMPRESSION: Persistent bilateral upper lobe pulmonary infiltrates. Persistent
bibasilar atelectasis and bilateral pleural effusions. Chest is
unchanged from prior exam.

## 2022-02-22 ENCOUNTER — Other Ambulatory Visit: Payer: Self-pay | Admitting: Internal Medicine

## 2022-04-17 ENCOUNTER — Other Ambulatory Visit: Payer: Self-pay | Admitting: Interventional Cardiology

## 2022-04-25 ENCOUNTER — Other Ambulatory Visit: Payer: PPO

## 2022-04-25 DIAGNOSIS — R972 Elevated prostate specific antigen [PSA]: Secondary | ICD-10-CM

## 2022-04-25 DIAGNOSIS — I4892 Unspecified atrial flutter: Secondary | ICD-10-CM | POA: Diagnosis not present

## 2022-04-25 DIAGNOSIS — E782 Mixed hyperlipidemia: Secondary | ICD-10-CM | POA: Diagnosis not present

## 2022-04-26 LAB — COMPLETE METABOLIC PANEL WITH GFR
AG Ratio: 1.6 (calc) (ref 1.0–2.5)
ALT: 11 U/L (ref 9–46)
AST: 18 U/L (ref 10–35)
Albumin: 4.5 g/dL (ref 3.6–5.1)
Alkaline phosphatase (APISO): 90 U/L (ref 35–144)
BUN: 20 mg/dL (ref 7–25)
CO2: 29 mmol/L (ref 20–32)
Calcium: 9.8 mg/dL (ref 8.6–10.3)
Chloride: 105 mmol/L (ref 98–110)
Creat: 1 mg/dL (ref 0.70–1.22)
Globulin: 2.9 g/dL (calc) (ref 1.9–3.7)
Glucose, Bld: 90 mg/dL (ref 65–99)
Potassium: 4.6 mmol/L (ref 3.5–5.3)
Sodium: 143 mmol/L (ref 135–146)
Total Bilirubin: 0.6 mg/dL (ref 0.2–1.2)
Total Protein: 7.4 g/dL (ref 6.1–8.1)
eGFR: 76 mL/min/{1.73_m2} (ref >=60)

## 2022-04-26 LAB — LIPID PANEL
Cholesterol: 196 mg/dL (ref <200)
HDL: 50 mg/dL (ref >=40)
LDL Cholesterol (Calc): 120 mg/dL (calc) — ABNORMAL HIGH
Non-HDL Cholesterol (Calc): 146 mg/dL (calc) — ABNORMAL HIGH (ref <130)
Total CHOL/HDL Ratio: 3.9 (calc) (ref <5.0)
Triglycerides: 149 mg/dL (ref <150)

## 2022-04-26 LAB — CBC WITH DIFFERENTIAL/PLATELET
Absolute Monocytes: 593 cells/uL (ref 200–950)
Basophils Absolute: 51 cells/uL (ref 0–200)
Basophils Relative: 0.9 %
Eosinophils Absolute: 143 cells/uL (ref 15–500)
Eosinophils Relative: 2.5 %
HCT: 46.8 % (ref 38.5–50.0)
Hemoglobin: 15.9 g/dL (ref 13.2–17.1)
Lymphs Abs: 1790 cells/uL (ref 850–3900)
MCH: 30.5 pg (ref 27.0–33.0)
MCHC: 34 g/dL (ref 32.0–36.0)
MCV: 89.8 fL (ref 80.0–100.0)
MPV: 10.6 fL (ref 7.5–12.5)
Monocytes Relative: 10.4 %
Neutro Abs: 3124 cells/uL (ref 1500–7800)
Neutrophils Relative %: 54.8 %
Platelets: 285 10*3/uL (ref 140–400)
RBC: 5.21 10*6/uL (ref 4.20–5.80)
RDW: 12.3 % (ref 11.0–15.0)
Total Lymphocyte: 31.4 %
WBC: 5.7 10*3/uL (ref 3.8–10.8)

## 2022-04-26 LAB — PSA: PSA: 5.08 ng/mL — ABNORMAL HIGH (ref <=4.00)

## 2022-05-02 ENCOUNTER — Ambulatory Visit (INDEPENDENT_AMBULATORY_CARE_PROVIDER_SITE_OTHER): Payer: PPO | Admitting: Internal Medicine

## 2022-05-02 ENCOUNTER — Encounter: Payer: Self-pay | Admitting: Internal Medicine

## 2022-05-02 VITALS — BP 122/72 | HR 79 | Temp 97.6°F | Ht 73.75 in | Wt 179.8 lb

## 2022-05-02 DIAGNOSIS — R972 Elevated prostate specific antigen [PSA]: Secondary | ICD-10-CM

## 2022-05-02 DIAGNOSIS — R0989 Other specified symptoms and signs involving the circulatory and respiratory systems: Secondary | ICD-10-CM

## 2022-05-02 DIAGNOSIS — Z7901 Long term (current) use of anticoagulants: Secondary | ICD-10-CM

## 2022-05-02 DIAGNOSIS — E782 Mixed hyperlipidemia: Secondary | ICD-10-CM

## 2022-05-02 DIAGNOSIS — Z8679 Personal history of other diseases of the circulatory system: Secondary | ICD-10-CM

## 2022-05-02 DIAGNOSIS — Z8673 Personal history of transient ischemic attack (TIA), and cerebral infarction without residual deficits: Secondary | ICD-10-CM | POA: Diagnosis not present

## 2022-05-02 DIAGNOSIS — Z8659 Personal history of other mental and behavioral disorders: Secondary | ICD-10-CM

## 2022-05-02 DIAGNOSIS — N486 Induration penis plastica: Secondary | ICD-10-CM

## 2022-05-02 DIAGNOSIS — Z79899 Other long term (current) drug therapy: Secondary | ICD-10-CM | POA: Diagnosis not present

## 2022-05-02 DIAGNOSIS — I340 Nonrheumatic mitral (valve) insufficiency: Secondary | ICD-10-CM | POA: Diagnosis not present

## 2022-05-02 DIAGNOSIS — Z0000 Encounter for general adult medical examination without abnormal findings: Secondary | ICD-10-CM

## 2022-05-02 DIAGNOSIS — N400 Enlarged prostate without lower urinary tract symptoms: Secondary | ICD-10-CM

## 2022-05-02 DIAGNOSIS — Z8616 Personal history of COVID-19: Secondary | ICD-10-CM

## 2022-05-02 LAB — POCT URINALYSIS DIPSTICK
Bilirubin, UA: NEGATIVE
Glucose, UA: NEGATIVE
Ketones, UA: NEGATIVE
Leukocytes, UA: NEGATIVE
Nitrite, UA: NEGATIVE
Protein, UA: NEGATIVE
Spec Grav, UA: 1.01 (ref 1.010–1.025)
Urobilinogen, UA: 0.2 E.U./dL
pH, UA: 7.5 (ref 5.0–8.0)

## 2022-05-02 NOTE — Progress Notes (Signed)
Subjective:    Patient ID: [name], male    DOB: April 21, 1941, 81 y.o.   MRN: [id number]  HPI 81 year old Male seen for Medicare wellness, health maintenance exam and evaluation of medical issues.   No new complaints. No chest pain or SOB.  He was hospitalized October 29 through September 07, 2020 with atypical pneumonia and atrial flutter.  He had a TEE conversion   which was successful.  He remains on Eliquis.  He is on Crestor 5 mg daily.  He is on Toprol 100 mg daily.  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  Had acute COVID-19 virus infection in February 2023.  Sees Dr. Lawerance Bach for BPH and had visit with him in September 2022.  Social history: He is a widower.  Wife died of complications of COPD.  He smoked as a young adult but quit about 50 years ago.  He resides alone.  He likes to spend time outdoors.  He is retired from [employer].  Son died in a motor vehicle accident many years ago before his wife died.  No other children.  Past medical history: Tonsillectomy 1949, bilateral cataract extraction 1994.  Surgery for retinal detachment of left eye June 2001.  History of positional vertigo in the past with episodes of cervical radiculopathy.  Family history: History of lung cancer in his father.  Hypertension in his brother.  Colon polyps in his mother.   Review of Systems going to see Urologist in July. Hx elevated PSA. No chest pain or SOB.     Objective:   Physical Exam Vital signs reviewed.  Skin: Warm and dry.  No cervical adenopathy.  He has a left carotid bruit.  No thyromegaly.  Chest clear.  Cardiac exam: Currently regular rate and rhythm without ectopy.  Abdomen is soft nondistended without hepatosplenomegaly masses or tenderness.  No lower extremity pitting edema.  Prostate is without nodules.  Neurological exam is intact without focal deficits.  Affect thought and judgment are normal.       Assessment & Plan:  History  of atrial fib\flutter associated with atypical pneumonia.  He is currently on chronic anticoagulation and followed by cardiologist.  He has no chest pain or shortness of breath.  BPH followed by Dr. Lawerance Bach.  History of elevated PSA.  History of Peyronie's disease-has urology appointment soon  History of anxiety and insomnia treated sparingly with Xanax.  History of TIA March 2021 with no recurrence  History of COVID-25 December 2021  History of left carotid bruit  Hyperlipidemia stable on low-dose Crestor  Plan: He will continue with current medications and cardiology follow-up.  Return in 1 year or as needed.  His tetanus immunization is up-to-date.  He will get flu vaccine in the fall.  He has had Prevnar 13 and pneumococcal 23 vaccines.  Has not had Shingrix vaccine to my knowledge.   Subjective:   Patient presents for Medicare Annual/Subsequent preventive examination.  Review Past Medical/Family/Social: See above   Risk Factors  Current exercise habits: Physically active about his property Dietary issues discussed: Yes  Cardiac risk factors: Hyperlipidemia and history of atrial flutter  Depression Screen  (Note: if answer to either of the following is "Yes", a more complete depression screening is indicated)   Over the past two weeks, have you felt down, depressed or hopeless? No  Over the past two weeks, have you felt little interest or pleasure in doing things? No  Have you lost interest or pleasure in daily life? No Do you often feel hopeless? No Do you cry easily over simple problems? No   Activities of Daily Living  In your present state of health, do you have any difficulty performing the following activities?:   Driving? No  Managing money? No  Feeding yourself? No  Getting from bed to chair? No  Climbing a flight of stairs? No  Preparing food and eating?: No  Bathing or showering? No  Getting dressed: No  Getting to the toilet? No  Using the toilet:No   Moving around from place to place: No  In the past year have you fallen or had a near fall?:No  Are you sexually active? No  Do you have more than one partner? No   Hearing Difficulties: No  Do you often ask people to speak up or repeat themselves? No  Do you experience ringing or noises in your ears? No  Do you have difficulty understanding soft or whispered voices? No  Do you feel that you have a problem with memory? No Do you often misplace items? No    Home Safety:  Do you have a smoke alarm at your residence? Yes Do you have grab bars in the bathroom?  Yes Do you have throw rugs in your house?  No   Cognitive Testing  Alert? Yes Normal Appearance?Yes  Oriented to person? Yes Place? Yes  Time? Yes  Recall of three objects? Yes  Can perform simple calculations? Yes  Displays appropriate judgment?Yes  Can read the correct time from a watch face?Yes   List the Names of Other Physician/Practitioners you currently use:  See referral list for the physicians patient is currently seeing.  [facility] cardiology  Dr. Lawerance Bach   Review of Systems: See above   Objective:     General appearance: Appears stated age and thin Head: Normocephalic, without obvious abnormality, atraumatic  Eyes: conj clear, EOMi PEERLA  Ears: normal TM's and external ear canals both ears  Nose: Nares normal. Septum midline. Mucosa normal. No drainage or sinus tenderness.  Throat: lips, mucosa, and tongue normal; teeth and gums normal  Neck: no adenopathy, no carotid bruit, no JVD, supple, symmetrical, trachea midline and thyroid not enlarged, symmetric, no tenderness/mass/nodules  No CVA tenderness.  Lungs: clear to auscultation bilaterally  Breasts: normal male.  Heart: regular rate and rhythm, S1, S2 normal, no murmur, click, rub or gallop  Abdomen: soft, non-tender; bowel sounds normal; no masses, no organomegaly  Musculoskeletal: ROM normal in all joints, no crepitus, no deformity, Normal  muscle strengthen. Back  is symmetric, no curvature. Skin: Skin color, texture, turgor normal. No rashes or lesions  Lymph nodes: Cervical, supraclavicular, and axillary nodes normal.  Neurologic: CN 2 -12 Normal, Normal symmetric reflexes. Normal coordination and gait  Psych: Alert & Oriented x 3, Mood appear stable.    Assessment:    Annual wellness medicare exam   Plan:    During the course of the visit the patient was educated and counseled about appropriate screening and preventive services including:   Vaccines discussed     Patient Instructions (the written plan) was given to the patient.  Medicare Attestation  I have personally reviewed:  The patient's medical and social history  Their use of alcohol, tobacco or illicit drugs  Their current medications and supplements  The patient's functional ability including ADLs,fall risks, home safety risks, cognitive, and hearing and visual impairment  Diet and physical activities  Evidence  for depression or mood disorders  The patient's weight, height, BMI, and visual acuity have been recorded in the chart. I have made referrals, counseling, and provided education to the patient based on review of the above and I have provided the patient with a written personalized care plan for preventive services.

## 2022-05-21 MED ORDER — ROSUVASTATIN CALCIUM 5 MG PO TABS: 5.0000 mg | ORAL_TABLET | Freq: Every day | ORAL | 3 refills | Status: DC

## 2022-05-21 NOTE — Patient Instructions (Signed)
It was a pleasure to see you today.  Please continue close follow-up with Cardiology and follow-up with urology as indicated.  Vaccines have been discussed.  Please consider updating her vaccines.  Continue current medications.  Return in 1 year or as needed.

## 2022-05-26 ENCOUNTER — Other Ambulatory Visit: Payer: Self-pay | Admitting: Interventional Cardiology

## 2022-07-13 DIAGNOSIS — N529 Male erectile dysfunction, unspecified: Secondary | ICD-10-CM | POA: Diagnosis not present

## 2022-07-13 DIAGNOSIS — N401 Enlarged prostate with lower urinary tract symptoms: Secondary | ICD-10-CM | POA: Diagnosis not present

## 2022-07-13 DIAGNOSIS — N138 Other obstructive and reflux uropathy: Secondary | ICD-10-CM | POA: Diagnosis not present

## 2022-07-13 DIAGNOSIS — R972 Elevated prostate specific antigen [PSA]: Secondary | ICD-10-CM | POA: Diagnosis not present

## 2022-07-19 ENCOUNTER — Other Ambulatory Visit: Payer: Self-pay | Admitting: Interventional Cardiology

## 2022-07-21 ENCOUNTER — Other Ambulatory Visit: Payer: Self-pay | Admitting: Interventional Cardiology

## 2022-07-21 DIAGNOSIS — I4892 Unspecified atrial flutter: Secondary | ICD-10-CM

## 2022-07-21 NOTE — Telephone Encounter (Signed)
Prescription refill request for Eliquis received. Indication: Aflutter Last office visit: 04/21/21 Irish Lack)  Scr: 1.00 (04/25/22)  Age: 81 Weight: 81.5kg  Pt overdue to see cardiologist. Message sent to schedulers. Called pt to see if he has enough medication until he schedules an appt. Pt stated he only have approximately one week left. 1 month supply sent to requested pharmacy.

## 2022-08-16 ENCOUNTER — Other Ambulatory Visit: Payer: Self-pay | Admitting: Interventional Cardiology

## 2022-08-30 ENCOUNTER — Encounter: Payer: Self-pay | Admitting: Interventional Cardiology

## 2022-08-30 ENCOUNTER — Ambulatory Visit: Payer: PPO | Attending: Interventional Cardiology | Admitting: Interventional Cardiology

## 2022-08-30 VITALS — BP 132/66 | HR 72 | Ht 74.0 in | Wt 181.0 lb

## 2022-08-30 DIAGNOSIS — I340 Nonrheumatic mitral (valve) insufficiency: Secondary | ICD-10-CM

## 2022-08-30 DIAGNOSIS — I4892 Unspecified atrial flutter: Secondary | ICD-10-CM | POA: Diagnosis not present

## 2022-08-30 DIAGNOSIS — Z7901 Long term (current) use of anticoagulants: Secondary | ICD-10-CM

## 2022-08-30 DIAGNOSIS — I700 Atherosclerosis of aorta: Secondary | ICD-10-CM

## 2022-08-30 NOTE — Patient Instructions (Signed)
Medication Instructions:  Your physician recommends that you continue on your current medications as directed. Please refer to the Current Medication list given to you today.  *If you need a refill on your cardiac medications before your next appointment, please call your pharmacy*   Lab Work: none If you have labs (blood work) drawn today and your tests are completely normal, you will receive your results only by: MyChart Message (if you have MyChart) OR A paper copy in the mail If you have any lab test that is abnormal or we need to change your treatment, we will call you to review the results.   Testing/Procedures: none   Follow-Up: At [facility], you and your health needs are our priority.  As part of our continuing mission to provide you with exceptional heart care, we have created designated Provider Care Teams.  These Care Teams include your primary Cardiologist (physician) and Advanced Practice Providers (APPs -  Physician Assistants and Nurse Practitioners) who all work together to provide you with the care you need, when you need it.  We recommend signing up for the patient portal called "MyChart".  Sign up information is provided on this After Visit Summary.  MyChart is used to connect with patients for Virtual Visits (Telemedicine).  Patients are able to view lab/test results, encounter notes, upcoming appointments, etc.  Non-urgent messages can be sent to your provider as well.   To learn more about what you can do with MyChart, go to [email or web address].    Your next appointment:   12 month(s)  The format for your next appointment:   In Person  Provider:   Jayadeep Varanasi, MD     Other Instructions    Important Information About Sugar       

## 2022-08-30 NOTE — Progress Notes (Signed)
Cardiology Office Note   Date:  08/30/2022   ID:  [name], DOB Mar 13, 1941, MRN [id number]  PCP:  [name], MD    No chief complaint on file.  Atrial flutter  Wt Readings from Last 3 Encounters:  08/30/22 181 lb (82.1 kg)  05/02/22 179 lb 12 oz (81.5 kg)  04/21/21 180 lb 3.2 oz (81.7 kg)       History of Present Illness: [name] is a 81 y.o. male  who was admitted to the hospital with atrial flutter in the setting of atypical pneumonia in 08/2020.  He was found to have a pleural effusion.  He converted to normal sinus rhythm with DCCV.  He was seen by Dr. [name] in the hospital.   He was sent home on Eliquis for stroke prevention, along with diltiazem and digoxin.   EF was normal.  Had a pleural effusion which resolved with antibiotics.     As of 12/21, he had not had any bleeding problems.   [employer] work is the most strenuous exercise.  No sx while he was in AFib back in 2021.   As of 2023, no sx.  Walking is most strenuous activity.  Denies : Chest pain. Dizziness. Leg edema. Nitroglycerin use. Orthopnea. Palpitations. Paroxysmal nocturnal dyspnea. Shortness of breath. Syncope.      Past Medical History:  Diagnosis Date   Allergy    seasonal   BPH (benign prostatic hypertrophy)    Cataract    bilateral-removed "back in 90's"   Eczema    Hyperlipidemia    Peyronie's disease    Retinal tear 2000's   bilateral-surgery to repair.   Urticaria     Past Surgical History:  Procedure Laterality Date   BUBBLE STUDY  09/06/2020   Procedure: BUBBLE STUDY;  Surgeon: Dixie Dials, MD;  Location: [facility];  Service: Cardiovascular;;   CARDIOVERSION N/A 09/06/2020   Procedure: CARDIOVERSION;  Surgeon: Dixie Dials, MD;  Location: [facility];  Service: Cardiovascular;  Laterality: N/A;   COLONOSCOPY     EYE SURGERY Bilateral 06/08   retinal detachment OS   EYE SURGERY Bilateral 1994   cataract OU   TEE WITHOUT CARDIOVERSION N/A 09/06/2020    Procedure: TRANSESOPHAGEAL ECHOCARDIOGRAM (TEE);  Surgeon: Dixie Dials, MD;  Location[facility] END[facility]COPY;  Service: Cardiovascular;  Laterality: N/A;   TONSILLECTOMY       Current Outpatient Medications  Medication Sig Dispense Refill   apixaban (ELIQUIS) 5 MG TABS tablet TAKE 1 TABLET BY MOUTH TWICE A DAY 60 tablet 2   diltiazem (CARDIZEM CD) 300 MG 24 hr capsule TAKE 1 CAPSULE BY MOUTH EVERY DAY 90 capsule 2   famotidine (PEPCID) 20 MG tablet Take 1 tablet (20 mg total) by mouth daily. 30 tablet 4   furosemide (LASIX) 20 MG tablet TAKE 1 TABLET BY MOUTH EVERY DAY 90 tablet 2   HYDROcodone bit-homatropine (HYCODAN) 5-1.5 MG/5ML syrup Take 5 mLs by mouth every 8 (eight) hours as needed for cough. 120 mL 0   metoprolol succinate (TOPROL-XL) 100 MG 24 hr tablet TAKE 1 TABLET BY MOUTH EVERY DAY NEEDS OFFICE VISIT 30 tablet 0   rosuvastatin (CRESTOR) 5 MG tablet Take 1 tablet (5 mg total) by mouth daily. 90 tablet 3   No current facility-administered medications for this visit.    Allergies:   Wellbutrin [bupropion hcl]    Social History:  The patient  reports that he quit smoking about 51 years ago. His smoking use included  cigarettes. He has never used smokeless tobacco. He reports that he does not currently use alcohol. He reports that he does not use drugs.   Family History:  The patient's family history includes Cancer in his father; Colon polyps in his mother; High blood pressure in his brother; Lung cancer in his father; Other in his father.    ROS:  Please see the history of present illness.   Otherwise, review of systems are positive for no further swelling; left shoulder joint pain.   All other systems are reviewed and negative.    PHYSICAL EXAM: VS:  BP 132/66   Pulse 72   Ht '6\' 2"'$  (1.88 m)   Wt 181 lb (82.1 kg)   SpO2 98%   BMI 23.24 kg/m  , BMI Body mass index is 23.24 kg/m. GEN: Well nourished, well developed, in no acute distress HEENT: normal Neck: no JVD, carotid  bruits, or masses Cardiac: RRR; no murmurs, rubs, or gallops,; tr ankle edema  Respiratory:  clear to auscultation bilaterally, normal work of breathing GI: soft, nontender, nondistended, + BS MS: no deformity or atrophy Skin: warm and dry, no rash Neuro:  Strength and sensation are intact Psych: euthymic mood, full affect   EKG:   The ekg ordered today demonstrates NSR, no ST segment changes   Recent Labs: 04/25/2022: ALT 11; BUN 20; Creat 1.00; Hemoglobin 15.9; Platelets 285; Potassium 4.6; Sodium 143   Lipid Panel    Component Value Date/Time   CHOL 196 04/25/2022 0919   TRIG 149 04/25/2022 0919   HDL 50 04/25/2022 0919   CHOLHDL 3.9 04/25/2022 0919   VLDL 32 (H) 01/30/2017 1019   LDLCALC 120 (H) 04/25/2022 0919     Other studies Reviewed: Additional studies/ records that were reviewed today with results demonstrating: labs reviewed.   ASSESSMENT AND PLAN:  Atrial flutter: No palpitations.  No bleeding issues.  In sinus rhythm today.  Tolerating beta-blocker and calcium channel blocker for any AV nodal blockade. Hyperlipidemia: The current medical regimen is effective;  continue present plan and medications. Hypertension: The current medical regimen is effective;  continue present plan and medications. Anticoagulated: Tolerating Eliquis.  Watch for bleeding issues.  Hemoglobin was 15.9 in July 2023.  This should be checked regularly. Mitral regurgitation/tricuspid regurgitation: No CHF symptoms. Aortic atherosclerosis: LDL 120 in June 2023.  May need higher dose of Crestor if LDL stays above 100.  He prefers to have Dr. Renold Genta recheck and adjust as needed.    Current medicines are reviewed at length with the patient today.  The patient concerns regarding his medicines were addressed.  The following changes have been made:  No change  Labs/ tests ordered today include:  No orders of the defined types were placed in this encounter.   Recommend 150 minutes/week of  aerobic exercise Low fat, low carb, high fiber diet recommended  Disposition:   FU in 1 year   Signed, Larae Grooms, MD  08/30/2022 10:03 AM    [facility] [place], [place], [place]  [address] Phone: [phone]; Fax: [phone]

## 2022-09-16 ENCOUNTER — Other Ambulatory Visit: Payer: Self-pay | Admitting: Interventional Cardiology

## 2022-10-13 ENCOUNTER — Other Ambulatory Visit: Payer: Self-pay | Admitting: Internal Medicine

## 2022-10-20 ENCOUNTER — Other Ambulatory Visit: Payer: Self-pay | Admitting: Interventional Cardiology

## 2022-10-20 DIAGNOSIS — I4892 Unspecified atrial flutter: Secondary | ICD-10-CM

## 2022-10-20 NOTE — Telephone Encounter (Signed)
Prescription refill request for Eliquis received. Indication: Aflutter Last office visit: 08/30/22 Irish Lack)  Scr: 1.00 (04/25/22)  Age: 81 Weight: 82.1kg  Appropriate dose and refill sent to requested pharmacy.

## 2022-11-14 ENCOUNTER — Other Ambulatory Visit: Payer: PPO

## 2022-11-14 DIAGNOSIS — E782 Mixed hyperlipidemia: Secondary | ICD-10-CM

## 2022-11-15 LAB — LIPID PANEL
Cholesterol: 183 mg/dL (ref <200)
HDL: 46 mg/dL (ref >=40)
LDL Cholesterol (Calc): 104 mg/dL (calc) — ABNORMAL HIGH
Non-HDL Cholesterol (Calc): 137 mg/dL (calc) — ABNORMAL HIGH (ref <130)
Total CHOL/HDL Ratio: 4 (calc) (ref <5.0)
Triglycerides: 210 mg/dL — ABNORMAL HIGH (ref <150)

## 2022-11-15 LAB — HEPATIC FUNCTION PANEL
AG Ratio: 1.6 (calc) (ref 1.0–2.5)
ALT: 17 U/L (ref 9–46)
AST: 18 U/L (ref 10–35)
Albumin: 4.7 g/dL (ref 3.6–5.1)
Alkaline phosphatase (APISO): 97 U/L (ref 35–144)
Bilirubin, Direct: 0.1 mg/dL (ref 0.0–0.2)
Globulin: 3 g/dL (calc) (ref 1.9–3.7)
Indirect Bilirubin: 0.5 mg/dL (calc) (ref 0.2–1.2)
Total Bilirubin: 0.6 mg/dL (ref 0.2–1.2)
Total Protein: 7.7 g/dL (ref 6.1–8.1)

## 2022-11-16 ENCOUNTER — Ambulatory Visit (INDEPENDENT_AMBULATORY_CARE_PROVIDER_SITE_OTHER): Payer: PPO | Admitting: Internal Medicine

## 2022-11-16 ENCOUNTER — Encounter: Payer: Self-pay | Admitting: Internal Medicine

## 2022-11-16 VITALS — BP 116/70 | HR 62 | Temp 98.4°F | Ht 74.0 in | Wt 181.8 lb

## 2022-11-16 DIAGNOSIS — Z7901 Long term (current) use of anticoagulants: Secondary | ICD-10-CM | POA: Diagnosis not present

## 2022-11-16 DIAGNOSIS — Z8679 Personal history of other diseases of the circulatory system: Secondary | ICD-10-CM

## 2022-11-16 DIAGNOSIS — R319 Hematuria, unspecified: Secondary | ICD-10-CM

## 2022-11-16 DIAGNOSIS — Z8673 Personal history of transient ischemic attack (TIA), and cerebral infarction without residual deficits: Secondary | ICD-10-CM

## 2022-11-16 DIAGNOSIS — N400 Enlarged prostate without lower urinary tract symptoms: Secondary | ICD-10-CM

## 2022-11-16 DIAGNOSIS — E782 Mixed hyperlipidemia: Secondary | ICD-10-CM

## 2022-11-16 DIAGNOSIS — Z0189 Encounter for other specified special examinations: Secondary | ICD-10-CM | POA: Diagnosis not present

## 2022-11-16 DIAGNOSIS — Z79899 Other long term (current) drug therapy: Secondary | ICD-10-CM | POA: Diagnosis not present

## 2022-11-16 LAB — POCT URINALYSIS DIPSTICK
Bilirubin, UA: NEGATIVE
Glucose, UA: NEGATIVE
Ketones, UA: NEGATIVE
Leukocytes, UA: NEGATIVE
Nitrite, UA: NEGATIVE
Protein, UA: NEGATIVE
Spec Grav, UA: 1.01 (ref 1.010–1.025)
Urobilinogen, UA: 0.2 E.U./dL
pH, UA: 6 (ref 5.0–8.0)

## 2022-11-16 NOTE — Patient Instructions (Signed)
Continue current dose of statin.  Watch diet a bit.  Return in 6 months.  Vaccines discussed but declined.  Continue other medications as prescribed.  It was a pleasure to see you today.

## 2022-11-16 NOTE — Progress Notes (Signed)
   Subjective:    Patient ID: [name], male    DOB: Jul 22, 1941, 82 y.o.   MRN: [id number]  HPI Here for 6 month recheck. Triglycerides elevated at 210. Ate a large shrimp cocktail the night before blood was drawn. He will watch diet and get more exercise. Does not want to increase Rosuvastatin.  He is on Diltiazem CD 300 mg daily.  Remains on Eliquis 5 mg twice daily, Lasix 20 mg daily and metoprolol XL 100 mg daily.  Denies shortness of breath.  Able to do routine activities.   Review of Systems  declines any  additional vaccines such as Covid-19, pneumococcal 20. Did have flu vaccine at [facility]. Tdap is still good.  Denies chest pain or shortness of breath.  Admits he could get a bit more exercise.     Objective:   Physical Exam Blood pressure 116/70, pulse 62, temperature 98.4 degrees, pulse oximetry 98%, weight 181 pounds 12.8 ounces, BMI 23.44  Skin: Warm and dry.  No cervical adenopathy.  No carotid bruits.  Chest clear.  Cardiac exam: Regular rate and rhythm without ectopy.  No lower extremity pitting edema.     Assessment & Plan:   Hypertriglyceridemia on low-dose rosuvastatin.  He does not want to change dose at this time.  We will reevaluate in 6 months.  History of atrial flutter: He is being followed by Dr. Irish Lack for history of atrial flutter.  Was seen in October.  He is on chronic anticoagulation.  He is in sinus rhythm today.  History of elevated PSA seen by Dr. Lawerance Bach in September 2023

## 2022-11-16 NOTE — Addendum Note (Signed)
Addended by: Geradine Girt D on: 11/16/2022 01:03 PM   Modules accepted: Orders

## 2022-11-16 NOTE — Addendum Note (Signed)
Addended by: Geradine Girt D on: 11/16/2022 12:59 PM   Modules accepted: Orders

## 2022-11-17 LAB — URINALYSIS, MICROSCOPIC ONLY
Bacteria, UA: NONE SEEN /HPF
Hyaline Cast: NONE SEEN /LPF
WBC, UA: NONE SEEN /HPF (ref 0–5)

## 2023-01-02 ENCOUNTER — Encounter (INDEPENDENT_AMBULATORY_CARE_PROVIDER_SITE_OTHER): Payer: Self-pay

## 2023-01-02 ENCOUNTER — Encounter (INDEPENDENT_AMBULATORY_CARE_PROVIDER_SITE_OTHER): Payer: PPO | Admitting: Ophthalmology

## 2023-01-02 DIAGNOSIS — T8529XD Other mechanical complication of intraocular lens, subsequent encounter: Secondary | ICD-10-CM | POA: Diagnosis not present

## 2023-01-02 DIAGNOSIS — H35352 Cystoid macular degeneration, left eye: Secondary | ICD-10-CM | POA: Diagnosis not present

## 2023-01-02 DIAGNOSIS — H35072 Retinal telangiectasis, left eye: Secondary | ICD-10-CM | POA: Diagnosis not present

## 2023-02-01 DIAGNOSIS — N529 Male erectile dysfunction, unspecified: Secondary | ICD-10-CM | POA: Diagnosis not present

## 2023-02-01 DIAGNOSIS — N138 Other obstructive and reflux uropathy: Secondary | ICD-10-CM | POA: Diagnosis not present

## 2023-02-01 DIAGNOSIS — N401 Enlarged prostate with lower urinary tract symptoms: Secondary | ICD-10-CM | POA: Diagnosis not present

## 2023-02-01 DIAGNOSIS — R972 Elevated prostate specific antigen [PSA]: Secondary | ICD-10-CM | POA: Diagnosis not present

## 2023-04-12 ENCOUNTER — Other Ambulatory Visit: Payer: Self-pay | Admitting: Interventional Cardiology

## 2023-05-03 NOTE — Progress Notes (Signed)
Annual Wellness Visit    Patient Care Team: Cassadi Purdie, Luanna Cole, MD as PCP - General (Internal Medicine) Corky Crafts, MD as PCP - Cardiology (Cardiology) Esaw Dace, MD as Attending Physician (Urology)  Visit Date: 05/17/23   Chief Complaint  Patient presents with   Medicare Wellness    Subjective:   Patient: [name], Male    DOB: 03/30/1941, 82 y.o.   MRN: [id number]  [name] is a 82 y.o. Male who presents today for his Annual Wellness Visit.  History of hyperlipidemia treated with rosuvastatin 5 mg daily. TRIG elevated at 154, LDL at 101.  History of hypertension treated with metoprolol succinate 100 mg daily. Blood pressure normal today at 132/62.  History of GERD treated with famotidine 20 mg daily.  No new complaints. No chest pain or SOB.   He was hospitalized October 29 through September 07, 2020 with atypical pneumonia and atrial flutter.  He had a TEE conversion, which was successful.  He remains on Eliquis.  He is on Crestor 5 mg daily.  He is on Toprol 100 mg daily.  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   Had acute COVID-19 virus infection in February 2023.  Sees Dr. Darvin Neighbours for BPH and had visit with him 01/2023. PSA at 5.57 on 05/14/23, up from 5.08 one year ago.  History of dependent edema treated with furosemide 20 mg daily. Notes swelling in left ankle/foot, mild swelling right ankle/foot. Denies leg pain.  Glucose normal. Kidney, liver functions normal. Electrolytes normal. Blood proteins normal. CBC normal.  Past medical history: Tonsillectomy 1949, bilateral cataract extraction 1994.  Surgery for retinal detachment of left eye June 2001.  History of positional vertigo in the past with episodes of cervical radiculopathy.   Social history: He is a widower.  Wife died of complications of COPD.  He smoked as a young adult but quit about 50 years ago.  He resides alone.  He likes to spend  time outdoors.  He is retired from [employer].  Son died in a motor vehicle accident many years ago before his wife died.  No other children.  Family history: History of lung cancer in his father.  Hypertension in his brother.  Colon polyps in his mother.  Past Medical History:  Diagnosis Date   Allergy    seasonal   BPH (benign prostatic hypertrophy)    Cataract    bilateral-removed "back in 90's"   Eczema    Hyperlipidemia    Peyronie's disease    Retinal tear 2000's   bilateral-surgery to repair.   Urticaria      Family History  Problem Relation Age of Onset   Cancer Father        back muscle    Other Father        pituatary tumor   Lung cancer Father    Colon polyps Mother    High blood pressure Brother    Colon cancer Neg Hx    Esophageal cancer Neg Hx    Rectal cancer Neg Hx    Stomach cancer Neg Hx      Social history: He is a widower.  Wife died of complications of COPD.  He quit smoking some 50 years ago.  He resides alone.  He likes to spend time outdoors.  He retired from [employer].  Son died in a motor vehicle accident many years ago before his wife  died.  No other children.  Additional past medical history: Tonsillectomy 1949, bilateral cataract extraction 1994.  Surgery for retinal detachment left eye June 2001.  History of positional vertigo in the past when having episodes of cervical radiculopathy.  Family history: History of lung cancer in his father.  Hypertension in his brother.  Colon polyps in his mother.  History of elevated PSA.  Currently at 5.57 and 7 years ago was 6.40.  Patient followed by Dr. Darvin Neighbours at [facility] clinic here in [place].  Review of Systems  Constitutional:  Negative for chills, fever, malaise/fatigue and weight loss.  HENT:  Negative for hearing loss, sinus pain and sore throat.   Respiratory:  Negative for cough, hemoptysis and shortness of breath.   Cardiovascular:  Positive for leg swelling.  Negative for chest pain, palpitations and PND.  Gastrointestinal:  Negative for abdominal pain, constipation, diarrhea, heartburn, nausea and vomiting.  Genitourinary:  Negative for dysuria, frequency and urgency.  Musculoskeletal:  Negative for back pain, myalgias and neck pain.  Skin:  Negative for itching and rash.  Neurological:  Negative for dizziness, tingling, seizures and headaches.  Endo/Heme/Allergies:  Negative for polydipsia.  Psychiatric/Behavioral:  Negative for depression. The patient is not nervous/anxious.       Objective:   Vitals: BP 132/62   Pulse 68   Resp 16   Ht 6\' 1"  (1.854 m)   Wt 180 lb 8 oz (81.9 kg)   SpO2 98%   BMI 23.81 kg/m   Physical Exam Vitals and nursing note reviewed.  Constitutional:      General: He is awake. He is not in acute distress.    Appearance: Normal appearance. He is not ill-appearing or toxic-appearing.  HENT:     Head: Normocephalic and atraumatic.     Right Ear: Tympanic membrane, ear canal and external ear normal.     Left Ear: Tympanic membrane, ear canal and external ear normal.     Mouth/Throat:     Pharynx: Oropharynx is clear.  Eyes:     Extraocular Movements: Extraocular movements intact.     Pupils: Pupils are equal, round, and reactive to light.  Neck:     Thyroid: No thyroid mass, thyromegaly or thyroid tenderness.     Vascular: No carotid bruit.  Cardiovascular:     Rate and Rhythm: Normal rate and regular rhythm. No extrasystoles are present.    Pulses:          Dorsalis pedis pulses are 1+ on the right side and 1+ on the left side.     Heart sounds: Normal heart sounds. No murmur heard.    No friction rub. No gallop.  Pulmonary:     Effort: Pulmonary effort is normal.     Breath sounds: Normal breath sounds. No decreased breath sounds, wheezing, rhonchi or rales.  Chest:     Chest wall: No mass.  Abdominal:     Palpations: Abdomen is soft.     Tenderness: There is no abdominal tenderness.     Hernia:  No hernia is present.  Genitourinary:    Comments: Prostate enlarged, symmetrical, smooth. Musculoskeletal:     Cervical back: Normal range of motion.     Right lower leg: No edema.     Left lower leg: No edema.  Feet:     Comments: Onychomycosis left first toe. Lymphadenopathy:     Cervical: No cervical adenopathy.     Upper Body:     Right upper body: No supraclavicular  adenopathy.     Left upper body: No supraclavicular adenopathy.  Skin:    General: Skin is warm and dry.  Neurological:     General: No focal deficit present.     Mental Status: He is alert and oriented to person, place, and time. Mental status is at baseline.     Cranial Nerves: Cranial nerves 2-12 are intact.     Sensory: Sensation is intact.     Motor: Motor function is intact.     Coordination: Coordination is intact.     Gait: Gait is intact.     Deep Tendon Reflexes: Reflexes are normal and symmetric.  Psychiatric:        Attention and Perception: Attention normal.        Mood and Affect: Mood normal.        Speech: Speech normal.        Behavior: Behavior normal. Behavior is cooperative.        Thought Content: Thought content normal.        Cognition and Memory: Cognition and memory normal.        Judgment: Judgment normal.      Most recent functional status assessment:    05/17/2023   10:55 AM  In your present state of health, do you have any difficulty performing the following activities:  Hearing? 0  Vision? 0  Difficulty concentrating or making decisions? 0  Walking or climbing stairs? 0  Dressing or bathing? 0  Doing errands, shopping? 0  Preparing Food and eating ? N  Using the Toilet? N  In the past six months, have you accidently leaked urine? N  Do you have problems with loss of bowel control? N  Managing your Medications? N  Managing your Finances? N  Housekeeping or managing your Housekeeping? N   Most recent fall risk assessment:    05/17/2023   10:53 AM  Fall Risk    Falls in the past year? 0  Number falls in past yr: 0  Injury with Fall? 0  Risk for fall due to : No Fall Risks    Most recent depression screenings:    05/17/2023   10:50 AM 11/16/2022   10:31 AM  PHQ 2/9 Scores  PHQ - 2 Score 0 0   Most recent cognitive screening:    05/17/2023   10:55 AM  6CIT Screen  What Year? 0 points  What month? 0 points  What time? 0 points  Count back from 20 0 points  Months in reverse 0 points  Repeat phrase 0 points  Total Score 0 points     Results:   Studies obtained and personally reviewed by me:  Last colonoscopy in 2020.  Labs:       Component Value Date/Time   NA 141 05/14/2023 0956   NA 145 (H) 01/09/2022 1445   K 4.6 05/14/2023 0956   CL 104 05/14/2023 0956   CO2 32 05/14/2023 0956   GLUCOSE 90 05/14/2023 0956   BUN 19 05/14/2023 0956   BUN 15 01/09/2022 1445   CREATININE 0.97 05/14/2023 0956   CALCIUM 9.7 05/14/2023 0956   PROT 7.3 05/14/2023 0956   PROT 6.9 05/03/2018 1030   ALBUMIN 3.2 (L) 09/07/2020 0304   ALBUMIN 4.4 05/03/2018 1030   AST 16 05/14/2023 0956   ALT 12 05/14/2023 0956   ALKPHOS 93 09/03/2020 1738   BILITOT 0.6 05/14/2023 0956   BILITOT 0.5 05/03/2018 1030   GFRNONAA 82 12/16/2020 1143   GFRAA  94 12/16/2020 1143     Lab Results  Component Value Date   WBC 5.1 05/14/2023   HGB 15.0 05/14/2023   HCT 44.9 05/14/2023   MCV 89.6 05/14/2023   PLT 245 05/14/2023    Lab Results  Component Value Date   CHOL 178 05/14/2023   HDL 51 05/14/2023   LDLCALC 101 (H) 05/14/2023   TRIG 154 (H) 05/14/2023   CHOLHDL 3.5 05/14/2023    Lab Results  Component Value Date   HGBA1C 5.1 09/07/2020     Lab Results  Component Value Date   TSH 2.527 09/04/2020     Lab Results  Component Value Date   PSA 5.57 (H) 05/14/2023   PSA 5.08 (H) 04/25/2022   PSA 4.21 (H) 12/16/2020    Assessment & Plan:   Hyperlipidemia: treated with rosuvastatin 5 mg daily. TRIG elevated at 154, LDL at  101.  Hypertension: treated with metoprolol succinate 100 mg daily. Blood pressure normal today at 132/62.  GERD: stable with famotidine 20 mg daily.  Cystoid macular edema: stable. Followed by Dr. Luciana Axe.  BPH: Seen by Dr. Darvin Neighbours on 01/2023. PSA at 5.57 on 05/14/23, up from 5.08 one year ago. Will send these results to Dr. Earlene Plater.  Dependent edema: treated with furosemide 20 mg daily. Notes swelling in left ankle/foot, mild swelling right ankle/foot. Denies leg pain.  Stool guaiac negative.  Vaccine counseling: UTD on tetanus vaccine.  Return in 1 year for health maintenance exam or as needed.     Annual wellness visit done today including the all of the following: Reviewed patient's Family Medical History Reviewed and updated list of patient's medical providers Assessment of cognitive impairment was done Assessed patient's functional ability Established a written schedule for health screening services Health Risk Assessent Completed and Reviewed  Discussed health benefits of physical activity, and encouraged him to engage in regular exercise appropriate for his age and condition.        I,Alexander Ruley,acting as a Neurosurgeon for Margaree Mackintosh, MD.,have documented all relevant documentation on the behalf of Margaree Mackintosh, MD,as directed by  Margaree Mackintosh, MD while in the presence of Margaree Mackintosh, MD.   I, Margaree Mackintosh, MD, have reviewed all documentation for this visit. The documentation on 06/03/23 for the exam, diagnosis, procedures, and orders are all accurate and complete.

## 2023-05-14 ENCOUNTER — Other Ambulatory Visit: Payer: PPO

## 2023-05-14 DIAGNOSIS — N400 Enlarged prostate without lower urinary tract symptoms: Secondary | ICD-10-CM

## 2023-05-14 DIAGNOSIS — F419 Anxiety disorder, unspecified: Secondary | ICD-10-CM

## 2023-05-14 DIAGNOSIS — E782 Mixed hyperlipidemia: Secondary | ICD-10-CM

## 2023-05-14 LAB — CBC WITH DIFFERENTIAL/PLATELET
Absolute Monocytes: 597 cells/uL (ref 200–950)
Basophils Absolute: 51 cells/uL (ref 0–200)
Basophils Relative: 1 %
Eosinophils Absolute: 184 cells/uL (ref 15–500)
Lymphs Abs: 1877 cells/uL (ref 850–3900)
MCH: 29.9 pg (ref 27.0–33.0)
MCV: 89.6 fL (ref 80.0–100.0)
Monocytes Relative: 11.7 %
Neutro Abs: 2392 cells/uL (ref 1500–7800)
Neutrophils Relative %: 46.9 %
RDW: 12.3 % (ref 11.0–15.0)

## 2023-05-15 ENCOUNTER — Other Ambulatory Visit: Payer: Self-pay | Admitting: Interventional Cardiology

## 2023-05-15 DIAGNOSIS — I4892 Unspecified atrial flutter: Secondary | ICD-10-CM

## 2023-05-15 LAB — COMPLETE METABOLIC PANEL WITH GFR
AG Ratio: 1.6 (calc) (ref 1.0–2.5)
ALT: 12 U/L (ref 9–46)
AST: 16 U/L (ref 10–35)
Albumin: 4.5 g/dL (ref 3.6–5.1)
Alkaline phosphatase (APISO): 86 U/L (ref 35–144)
BUN: 19 mg/dL (ref 7–25)
CO2: 32 mmol/L (ref 20–32)
Calcium: 9.7 mg/dL (ref 8.6–10.3)
Chloride: 104 mmol/L (ref 98–110)
Creat: 0.97 mg/dL (ref 0.70–1.22)
Globulin: 2.8 g/dL (calc) (ref 1.9–3.7)
Glucose, Bld: 90 mg/dL (ref 65–99)
Potassium: 4.6 mmol/L (ref 3.5–5.3)
Sodium: 141 mmol/L (ref 135–146)
Total Bilirubin: 0.6 mg/dL (ref 0.2–1.2)
Total Protein: 7.3 g/dL (ref 6.1–8.1)
eGFR: 78 mL/min/{1.73_m2} (ref >=60)

## 2023-05-15 LAB — CBC WITH DIFFERENTIAL/PLATELET
Eosinophils Relative: 3.6 %
HCT: 44.9 % (ref 38.5–50.0)
Hemoglobin: 15 g/dL (ref 13.2–17.1)
MCHC: 33.4 g/dL (ref 32.0–36.0)
MPV: 10.8 fL (ref 7.5–12.5)
Platelets: 245 10*3/uL (ref 140–400)
RBC: 5.01 10*6/uL (ref 4.20–5.80)
Total Lymphocyte: 36.8 %
WBC: 5.1 10*3/uL (ref 3.8–10.8)

## 2023-05-15 LAB — LIPID PANEL
Cholesterol: 178 mg/dL (ref <200)
HDL: 51 mg/dL (ref >=40)
LDL Cholesterol (Calc): 101 mg/dL (calc) — ABNORMAL HIGH
Non-HDL Cholesterol (Calc): 127 mg/dL (calc) (ref <130)
Total CHOL/HDL Ratio: 3.5 (calc) (ref <5.0)
Triglycerides: 154 mg/dL — ABNORMAL HIGH (ref <150)

## 2023-05-15 LAB — PSA: PSA: 5.57 ng/mL — ABNORMAL HIGH (ref <=4.00)

## 2023-05-16 NOTE — Telephone Encounter (Signed)
Prescription refill request for Eliquis received. Indication: aflutter, afib  Last office visit: [place], 08/30/2022 Scr: 0.97, 05/14/2023 Age: 82 yo  Weight: 82.5 kg

## 2023-05-17 ENCOUNTER — Encounter: Payer: Self-pay | Admitting: Internal Medicine

## 2023-05-17 ENCOUNTER — Ambulatory Visit: Payer: PPO | Admitting: Internal Medicine

## 2023-05-17 VITALS — BP 132/62 | HR 68 | Resp 16 | Ht 73.0 in | Wt 180.5 lb

## 2023-05-17 DIAGNOSIS — Z0000 Encounter for general adult medical examination without abnormal findings: Secondary | ICD-10-CM

## 2023-05-17 DIAGNOSIS — Z8659 Personal history of other mental and behavioral disorders: Secondary | ICD-10-CM

## 2023-05-17 DIAGNOSIS — E782 Mixed hyperlipidemia: Secondary | ICD-10-CM | POA: Diagnosis not present

## 2023-05-17 DIAGNOSIS — Z8679 Personal history of other diseases of the circulatory system: Secondary | ICD-10-CM | POA: Diagnosis not present

## 2023-05-17 DIAGNOSIS — Z8673 Personal history of transient ischemic attack (TIA), and cerebral infarction without residual deficits: Secondary | ICD-10-CM | POA: Diagnosis not present

## 2023-05-17 DIAGNOSIS — Z79899 Other long term (current) drug therapy: Secondary | ICD-10-CM | POA: Diagnosis not present

## 2023-05-17 DIAGNOSIS — R972 Elevated prostate specific antigen [PSA]: Secondary | ICD-10-CM | POA: Diagnosis not present

## 2023-05-17 DIAGNOSIS — Z7901 Long term (current) use of anticoagulants: Secondary | ICD-10-CM | POA: Diagnosis not present

## 2023-05-17 DIAGNOSIS — N400 Enlarged prostate without lower urinary tract symptoms: Secondary | ICD-10-CM | POA: Diagnosis not present

## 2023-05-17 NOTE — Patient Instructions (Addendum)
PSA results will be mailed to Dr. Darvin Neighbours, Urologist.. PSA remains elevated in the 5 range. Other labs are stable. Has some mild glucose intolerance.  Watch carbohydrates in diet.  Other labs are stable.  It was a pleasure to see you today as always.  Return in 1 year or as needed.

## 2023-07-12 ENCOUNTER — Other Ambulatory Visit: Payer: Self-pay | Admitting: Internal Medicine

## 2023-07-13 ENCOUNTER — Other Ambulatory Visit: Payer: Self-pay | Admitting: Internal Medicine

## 2023-09-05 ENCOUNTER — Ambulatory Visit: Payer: PPO | Attending: Cardiology | Admitting: Cardiology

## 2023-09-05 ENCOUNTER — Encounter: Payer: Self-pay | Admitting: Cardiology

## 2023-09-05 VITALS — BP 120/60 | HR 62 | Ht 73.0 in | Wt 182.2 lb

## 2023-09-05 DIAGNOSIS — E782 Mixed hyperlipidemia: Secondary | ICD-10-CM | POA: Diagnosis not present

## 2023-09-05 DIAGNOSIS — I4892 Unspecified atrial flutter: Secondary | ICD-10-CM

## 2023-09-05 DIAGNOSIS — I10 Essential (primary) hypertension: Secondary | ICD-10-CM

## 2023-09-05 DIAGNOSIS — I340 Nonrheumatic mitral (valve) insufficiency: Secondary | ICD-10-CM

## 2023-09-05 DIAGNOSIS — I071 Rheumatic tricuspid insufficiency: Secondary | ICD-10-CM

## 2023-09-05 DIAGNOSIS — R5383 Other fatigue: Secondary | ICD-10-CM

## 2023-09-05 MED ORDER — METOPROLOL SUCCINATE ER 50 MG PO TB24: 50.0000 mg | ORAL_TABLET | Freq: Every day | ORAL | 1 refills | Status: DC

## 2023-09-05 NOTE — Patient Instructions (Signed)
Medication Instructions:  DECREASE Metoprolol Succinate to 50mg  Once a day (can cut 100mg  tab in half)  *If you need a refill on your cardiac medications before your next appointment, please call your pharmacy*   Lab Work: None ordered   Testing/Procedures: None ordered   Follow-Up: At [facility], you and your health needs are our priority.  As part of our continuing mission to provide you with exceptional heart care, we have created designated Provider Care Teams.  These Care Teams include your primary Cardiologist (physician) and Advanced Practice Providers (APPs -  Physician Assistants and Nurse Practitioners) who all work together to provide you with the care you need, when you need it.  We recommend signing up for the patient portal called "MyChart".  Sign up information is provided on this After Visit Summary.  MyChart is used to connect with patients for Virtual Visits (Telemedicine).  Patients are able to view lab/test results, encounter notes, upcoming appointments, etc.  Non-urgent messages can be sent to your provider as well.   To learn more about what you can do with MyChart, go to [email or web address].    Your next appointment:   6 month(s)  Provider:   Tessa Lerner, MD     Other Instructions

## 2023-09-05 NOTE — Progress Notes (Unsigned)
Cardiology Office Note:   Date:  09/06/2023  ID:  [name], DOB 05-21-1941, MRN [id number] PCP: Margaree Mackintosh, MD  [facility] Providers Cardiologist:  Lance Muss, MD    History of Present Illness:   Discussed the use of AI scribe software for clinical note transcription with the patient, who gave verbal consent to proceed.  History of Present Illness   The patient is an 82 year old male with a history of atrial flutter, which was diagnosed during an episode of atypical pneumonia in October 2021. The atrial flutter was successfully treated with direct current cardioversion and there has been no documented recurrence. The patient has been maintained on Eliquis, diltiazem, and metoprolol without any reported side effects such as dizziness or lightheadedness.  The patient denies any recurrence of palpitations or fluttering that might suggest a return of the atrial flutter. He reports feeling generally well and is able to engage in moderately strenuous physical activity such as yard work without experiencing shortness of breath or chest discomfort.  The patient has been experiencing some chronic fatigue, which he attributes to age rather than his medical condition or medication. He reports occasional daytime sleepiness. The patient also reports occasional mild swelling in his feet, which improves with leg elevation.  His blood pressure has also been stable, typically in the 120s, and he has been monitoring it at home. The patient's kidney function and hemoglobin levels have remained stable.  The patient has a history of mild to moderate mitral valve regurgitation and severe tricuspid regurgitation, diagnosed via echocardiogram in 2021. However, he has not experienced any symptoms suggestive of progression of these conditions.       Studies Reviewed:    EKG:   EKG Interpretation Date/Time:  Wednesday September 05 2023 10:32:54 EDT Ventricular Rate:  62 PR  Interval:  204 QRS Duration:  102 QT Interval:  418 QTC Calculation: 424 R Axis:   72  Text Interpretation: Normal sinus rhythm Minimal voltage criteria for LVH, may be normal variant ( Sokolow-Lyon ) When compared with ECG of 06-Sep-2020 21:24, Vent. rate has decreased BY  41 BPM Nonspecific T wave abnormality no longer evident in Inferior leads Nonspecific T wave abnormality no longer evident in Lateral leads Confirmed by Perlie Gold [phone]) on 09/05/2023 10:55:20 AM      Risk Assessment/Calculations:    CHA2DS2-VASc Score = 3   This indicates a 3.2% annual risk of stroke. The patient's score is based upon: CHF History: 0 HTN History: 1 Diabetes History: 0 Stroke History: 0 Vascular Disease History: 0 Age Score: 2 Gender Score: 0             Physical Exam:   VS:  BP 120/60   Pulse 62   Ht 6\' 1"  (1.854 m)   Wt 182 lb 3.2 oz (82.6 kg)   SpO2 96%   BMI 24.04 kg/m    Wt Readings from Last 3 Encounters:  09/05/23 182 lb 3.2 oz (82.6 kg)  05/17/23 180 lb 8 oz (81.9 kg)  11/16/22 181 lb 12.8 oz (82.5 kg)     Physical Exam Vitals reviewed.  Constitutional:      Appearance: Normal appearance.  HENT:     Head: Normocephalic.  Eyes:     Pupils: Pupils are equal, round, and reactive to light.  Cardiovascular:     Rate and Rhythm: Normal rate and regular rhythm.     Pulses: Normal pulses.     Heart sounds: Normal heart sounds.  Pulmonary:  Effort: Pulmonary effort is normal.     Breath sounds: Normal breath sounds.  Abdominal:     General: Abdomen is flat.     Palpations: Abdomen is soft.  Musculoskeletal:     Right lower leg: Edema (trace) present.     Left lower leg: Edema (trace) present.  Skin:    General: Skin is warm and dry.     Capillary Refill: Capillary refill takes less than 2 seconds.  Neurological:     General: No focal deficit present.     Mental Status: He is alert and oriented to person, place, and time.  Psychiatric:        Mood and  Affect: Mood normal.        Behavior: Behavior normal.        Thought Content: Thought content normal.        Judgment: Judgment normal.     Physical Exam   VITALS: BP- 120/60 CHEST: Clear lung sounds CARDIOVASCULAR: Normal sinus rhythm on EKG, no murmur auscultated EXTREMITIES: Edema in the top of the foot       ASSESSMENT AND PLAN:       Atrial Flutter No recurrence of symptoms since cardioversion in 2021. Currently in normal sinus rhythm on EKG. -Diltiazem and metoprolol succinate in combination is atypical but patient has now been stable on this for years. Stable ECG without bradycardia or other arrhythmia.  -Continue Eliquis BID, Diltiazem, and Metoprolol daily. As below, will reduce Metoprolol dosing to 50mg  daily given possibility that dual AV nodal/anti-hypertensive agents are contributing to fatigue.   Fatigue Generally mild per patient. Possible side effect of taking both Diltiazem and Metoprolol. No symptoms of exertional limitation. Patient also without dizziness. -Reduce Metoprolol Succinate from 100mg  to 50mg  daily. -Monitor blood pressure daily for the next two weeks and report readings.  Hyperlipidemia with aortic atherosclerosis LDL of 101 essentially at goal (goal <100) on Crestor 5mg . -Continue Crestor 5mg  daily.  Valvular Heart Disease History of mild to moderate mitral valve regurgitation and severe tricuspid regurgitation on echocardiogram. No current murmur on auscultation and no symptoms suggestive of progression. -No change in management. No need for repeat echocardiogram at this time.  Follow-up Establish care with a new cardiologist Dr. Odis Hollingshead in 6 months with departure of Dr. Eldridge Dace.       Signed, Perlie Gold, PA-C

## 2023-09-06 DIAGNOSIS — I10 Essential (primary) hypertension: Secondary | ICD-10-CM | POA: Insufficient documentation

## 2023-09-06 NOTE — Progress Notes (Incomplete)
Cardiology Office Note:   Date:  09/06/2023  ID:  [name], DOB Mar 07, 1941, MRN [id number] PCP: Margaree Mackintosh, MD  [facility] Providers Cardiologist:  Lance Muss, MD { Click to update primary MD,subspecialty MD or APP then REFRESH:1}   History of Present Illness:   Discussed the use of AI scribe software for clinical note transcription with the patient, who gave verbal consent to proceed.  History of Present Illness   The patient is an 82 year old male with a history of atrial flutter, which was diagnosed during an episode of atypical pneumonia in October 2021. The atrial flutter was successfully treated with direct current cardioversion and there has been no documented recurrence. The patient has been maintained on Eliquis, diltiazem, and metoprolol without any reported side effects such as dizziness or lightheadedness.  The patient denies any recurrence of palpitations or fluttering that might suggest a return of the atrial flutter. He reports feeling generally well and is able to engage in moderately strenuous physical activity such as yard work without experiencing shortness of breath or chest discomfort.  The patient has been experiencing some chronic fatigue, which he attributes to age rather than his medical condition or medication. He reports occasional daytime sleepiness. The patient also reports occasional mild swelling in his feet, which improves with leg elevation.  His blood pressure has also been stable, typically in the 120s, and he has been monitoring it at home. The patient's kidney function and hemoglobin levels have remained stable.  The patient has a history of mild to moderate mitral valve regurgitation and severe tricuspid regurgitation, diagnosed via echocardiogram in 2021. However, he has not experienced any symptoms suggestive of progression of these conditions.       Studies Reviewed:    EKG:   EKG Interpretation Date/Time:  Wednesday  September 05 2023 10:32:54 EDT Ventricular Rate:  62 PR Interval:  204 QRS Duration:  102 QT Interval:  418 QTC Calculation: 424 R Axis:   72  Text Interpretation: Normal sinus rhythm Minimal voltage criteria for LVH, may be normal variant ( Sokolow-Lyon ) When compared with ECG of 06-Sep-2020 21:24, Vent. rate has decreased BY  41 BPM Nonspecific T wave abnormality no longer evident in Inferior leads Nonspecific T wave abnormality no longer evident in Lateral leads Confirmed by Perlie Gold [phone]) on 09/05/2023 10:55:20 AM      Risk Assessment/Calculations:    CHA2DS2-VASc Score =    {Click here to calculate score.  REFRESH note before signing. :1} This indicates a  % annual risk of stroke. The patient's score is based upon:               Physical Exam:   VS:  BP 120/60   Pulse 62   Ht 6\' 1"  (1.854 m)   Wt 182 lb 3.2 oz (82.6 kg)   SpO2 96%   BMI 24.04 kg/m    Wt Readings from Last 3 Encounters:  09/05/23 182 lb 3.2 oz (82.6 kg)  05/17/23 180 lb 8 oz (81.9 kg)  11/16/22 181 lb 12.8 oz (82.5 kg)     Physical Exam Vitals reviewed.  Constitutional:      Appearance: Normal appearance.  HENT:     Head: Normocephalic.  Eyes:     Pupils: Pupils are equal, round, and reactive to light.  Cardiovascular:     Rate and Rhythm: Normal rate and regular rhythm.     Pulses: Normal pulses.     Heart sounds: Normal heart sounds.  Pulmonary:     Effort: Pulmonary effort is normal.     Breath sounds: Normal breath sounds.  Abdominal:     General: Abdomen is flat.     Palpations: Abdomen is soft.  Musculoskeletal:     Right lower leg: Edema (trace) present.     Left lower leg: Edema (trace) present.  Skin:    General: Skin is warm and dry.     Capillary Refill: Capillary refill takes less than 2 seconds.  Neurological:     General: No focal deficit present.     Mental Status: He is alert and oriented to person, place, and time.  Psychiatric:        Mood and Affect: Mood  normal.        Behavior: Behavior normal.        Thought Content: Thought content normal.        Judgment: Judgment normal.     Physical Exam   VITALS: BP- 120/60 CHEST: Clear lung sounds CARDIOVASCULAR: Normal sinus rhythm on EKG, no murmur auscultated EXTREMITIES: Edema in the top of the foot       ASSESSMENT AND PLAN:     Assessment and Plan    Atrial Flutter No recurrence of symptoms since cardioversion in 2021. Currently in normal sinus rhythm on EKG. -Continue Eliquis BID, Diltiazem, and Metoprolol daily.  Fatigue Possible side effect of dual rate control with Diltiazem and Metoprolol. No symptoms of exertional fatigue or dizziness. -Reduce Metoprolol from 100mg  to 50mg  daily. -Monitor blood pressure daily for the next two weeks and report readings.  Hyperlipidemia LDL slightly above target at 101 (goal <100) on Crestor 5mg . -Continue Crestor 5mg  daily.  Valvular Heart Disease History of mild to moderate mitral valve regurgitation and severe tricuspid regurgitation. No current murmur on auscultation and no symptoms suggestive of progression. -No change in management. No need for repeat echocardiogram at this time.  Follow-up Establish care with a new cardiologist in 6 months due to departure of previous cardiologist.             {Are you ordering a CV Procedure (e.g. stress test, cath, DCCV, TEE, etc)?   Press F2        :[id number]}   Signed, Perlie Gold, PA-C

## 2023-10-07 ENCOUNTER — Other Ambulatory Visit: Payer: Self-pay | Admitting: Internal Medicine

## 2023-10-28 ENCOUNTER — Other Ambulatory Visit: Payer: Self-pay | Admitting: Interventional Cardiology

## 2023-10-28 DIAGNOSIS — I4892 Unspecified atrial flutter: Secondary | ICD-10-CM

## 2023-10-29 NOTE — Telephone Encounter (Signed)
Prescription refill request for Eliquis received. Indication:afib Last office visit:10/24 Scr:0.97  7/24 Age: 82 Weight:82.6  kg  Prescription refilled

## 2024-01-02 DIAGNOSIS — H35072 Retinal telangiectasis, left eye: Secondary | ICD-10-CM | POA: Diagnosis not present

## 2024-01-02 DIAGNOSIS — H35352 Cystoid macular degeneration, left eye: Secondary | ICD-10-CM | POA: Diagnosis not present

## 2024-01-02 DIAGNOSIS — T8529XD Other mechanical complication of intraocular lens, subsequent encounter: Secondary | ICD-10-CM | POA: Diagnosis not present

## 2024-01-02 DIAGNOSIS — Z8669 Personal history of other diseases of the nervous system and sense organs: Secondary | ICD-10-CM | POA: Diagnosis not present

## 2024-02-02 ENCOUNTER — Other Ambulatory Visit: Payer: Self-pay | Admitting: Cardiology

## 2024-02-07 DIAGNOSIS — N401 Enlarged prostate with lower urinary tract symptoms: Secondary | ICD-10-CM | POA: Diagnosis not present

## 2024-02-07 DIAGNOSIS — N138 Other obstructive and reflux uropathy: Secondary | ICD-10-CM | POA: Diagnosis not present

## 2024-02-07 DIAGNOSIS — R972 Elevated prostate specific antigen [PSA]: Secondary | ICD-10-CM | POA: Diagnosis not present

## 2024-02-07 DIAGNOSIS — N529 Male erectile dysfunction, unspecified: Secondary | ICD-10-CM | POA: Diagnosis not present

## 2024-04-02 ENCOUNTER — Other Ambulatory Visit: Payer: Self-pay | Admitting: Internal Medicine

## 2024-04-26 ENCOUNTER — Other Ambulatory Visit: Payer: Self-pay | Admitting: Cardiology

## 2024-04-26 DIAGNOSIS — I4892 Unspecified atrial flutter: Secondary | ICD-10-CM

## 2024-04-28 NOTE — Telephone Encounter (Signed)
 Eliquis  5mg  refill request received. Patient is 83 years old, weight-82.6kg, Crea-0.97 on 05/14/23, Diagnosis-Aflutter, and last seen by Artist Pouch on 09/05/23. Dose is appropriate based on dosing criteria. Will send in refill to requested pharmacy.

## 2024-05-16 ENCOUNTER — Other Ambulatory Visit: Payer: PPO

## 2024-05-16 ENCOUNTER — Other Ambulatory Visit

## 2024-05-16 DIAGNOSIS — N400 Enlarged prostate without lower urinary tract symptoms: Secondary | ICD-10-CM

## 2024-05-16 DIAGNOSIS — R972 Elevated prostate specific antigen [PSA]: Secondary | ICD-10-CM

## 2024-05-16 DIAGNOSIS — Z79899 Other long term (current) drug therapy: Secondary | ICD-10-CM | POA: Diagnosis not present

## 2024-05-16 DIAGNOSIS — Z7901 Long term (current) use of anticoagulants: Secondary | ICD-10-CM

## 2024-05-16 DIAGNOSIS — E782 Mixed hyperlipidemia: Secondary | ICD-10-CM

## 2024-05-16 DIAGNOSIS — Z0000 Encounter for general adult medical examination without abnormal findings: Secondary | ICD-10-CM | POA: Diagnosis not present

## 2024-05-17 LAB — CBC WITH DIFFERENTIAL/PLATELET
Absolute Lymphocytes: 1518 {cells}/uL (ref 850–3900)
Absolute Monocytes: 460 {cells}/uL (ref 200–950)
Basophils Absolute: 39 {cells}/uL (ref 0–200)
Basophils Relative: 0.9 %
Eosinophils Absolute: 181 {cells}/uL (ref 15–500)
Eosinophils Relative: 4.2 %
HCT: 46.3 % (ref 38.5–50.0)
Hemoglobin: 15 g/dL (ref 13.2–17.1)
MCH: 29.6 pg (ref 27.0–33.0)
MCHC: 32.4 g/dL (ref 32.0–36.0)
MCV: 91.3 fL (ref 80.0–100.0)
MPV: 10.5 fL (ref 7.5–12.5)
Monocytes Relative: 10.7 %
Neutro Abs: 2103 {cells}/uL (ref 1500–7800)
Neutrophils Relative %: 48.9 %
Platelets: 274 Thousand/uL (ref 140–400)
RBC: 5.07 Million/uL (ref 4.20–5.80)
RDW: 12.7 % (ref 11.0–15.0)
Total Lymphocyte: 35.3 %
WBC: 4.3 Thousand/uL (ref 3.8–10.8)

## 2024-05-17 LAB — COMPLETE METABOLIC PANEL WITHOUT GFR
AG Ratio: 1.5 (calc) (ref 1.0–2.5)
ALT: 14 U/L (ref 9–46)
AST: 20 U/L (ref 10–35)
Albumin: 4.3 g/dL (ref 3.6–5.1)
Alkaline phosphatase (APISO): 86 U/L (ref 35–144)
BUN: 19 mg/dL (ref 7–25)
CO2: 31 mmol/L (ref 20–32)
Calcium: 9.6 mg/dL (ref 8.6–10.3)
Chloride: 103 mmol/L (ref 98–110)
Creat: 1.1 mg/dL (ref 0.70–1.22)
Globulin: 2.8 g/dL (ref 1.9–3.7)
Glucose, Bld: 88 mg/dL (ref 65–99)
Potassium: 4.7 mmol/L (ref 3.5–5.3)
Sodium: 142 mmol/L (ref 135–146)
Total Bilirubin: 0.6 mg/dL (ref 0.2–1.2)
Total Protein: 7.1 g/dL (ref 6.1–8.1)

## 2024-05-17 LAB — LIPID PANEL
Cholesterol: 166 mg/dL (ref <200)
HDL: 44 mg/dL (ref >=40)
LDL Cholesterol (Calc): 97 mg/dL
Non-HDL Cholesterol (Calc): 122 mg/dL (ref <130)
Total CHOL/HDL Ratio: 3.8 (calc) (ref <5.0)
Triglycerides: 158 mg/dL — ABNORMAL HIGH (ref <150)

## 2024-05-17 LAB — PSA: PSA: 6.2 ng/mL — ABNORMAL HIGH (ref <=4.00)

## 2024-05-18 ENCOUNTER — Ambulatory Visit: Payer: Self-pay | Admitting: Internal Medicine

## 2024-05-19 ENCOUNTER — Encounter: Payer: Self-pay | Admitting: Internal Medicine

## 2024-05-19 ENCOUNTER — Ambulatory Visit (INDEPENDENT_AMBULATORY_CARE_PROVIDER_SITE_OTHER): Payer: PPO | Admitting: Internal Medicine

## 2024-05-19 VITALS — BP 120/70 | HR 71 | Ht 73.0 in | Wt 187.0 lb

## 2024-05-19 DIAGNOSIS — Z79899 Other long term (current) drug therapy: Secondary | ICD-10-CM | POA: Diagnosis not present

## 2024-05-19 DIAGNOSIS — Z0000 Encounter for general adult medical examination without abnormal findings: Secondary | ICD-10-CM | POA: Diagnosis not present

## 2024-05-19 DIAGNOSIS — E782 Mixed hyperlipidemia: Secondary | ICD-10-CM

## 2024-05-19 DIAGNOSIS — Z8659 Personal history of other mental and behavioral disorders: Secondary | ICD-10-CM

## 2024-05-19 DIAGNOSIS — R972 Elevated prostate specific antigen [PSA]: Secondary | ICD-10-CM | POA: Diagnosis not present

## 2024-05-19 DIAGNOSIS — Z8673 Personal history of transient ischemic attack (TIA), and cerebral infarction without residual deficits: Secondary | ICD-10-CM | POA: Diagnosis not present

## 2024-05-19 DIAGNOSIS — N400 Enlarged prostate without lower urinary tract symptoms: Secondary | ICD-10-CM | POA: Diagnosis not present

## 2024-05-19 DIAGNOSIS — Z7901 Long term (current) use of anticoagulants: Secondary | ICD-10-CM | POA: Diagnosis not present

## 2024-05-19 DIAGNOSIS — Z8679 Personal history of other diseases of the circulatory system: Secondary | ICD-10-CM

## 2024-05-19 LAB — POCT URINALYSIS DIP (CLINITEK)
Bilirubin, UA: NEGATIVE
Blood, UA: NEGATIVE
Glucose, UA: NEGATIVE mg/dL
Ketones, POC UA: NEGATIVE mg/dL
Leukocytes, UA: NEGATIVE
Nitrite, UA: NEGATIVE
POC PROTEIN,UA: NEGATIVE
Spec Grav, UA: 1.015 (ref 1.010–1.025)
Urobilinogen, UA: 0.2 U/dL
pH, UA: 6.5 (ref 5.0–8.0)

## 2024-05-19 NOTE — Progress Notes (Signed)
 Annual Wellness Visit   Patient Care Team: [name], Shawn PARAS, MD as PCP - General (Internal Medicine) Shawn Candyce RAMAN, MD as PCP - Cardiology (Cardiology) Shawn Tanda LITTIE DOUGLAS, MD as Attending Physician (Urology)  Visit Date: 05/19/24   Chief Complaint  Patient presents with   Medicare Wellness   Subjective:  Patient: [name], Male DOB: 1941/04/30, 83 y.o. MRN: [id number]  [name] is a 83 y.o. Male who presents today for his Annual Wellness Visit. Patient has Anxiety; Hyperlipidemia; BPH (Benign Prostatic Hyperplasia); GERD (Gastroesophageal Reflux Disease); Insomnia; Lumbar Back Pain w/ Radiculopathy, Right Lower Extremity; Benign Prostatic Hyperplasia w/ Urinary Obstruction; Benign Fibroma, Prostate; Bladder Neck Obstruction; Elevated Prostate Specific Antigen (PSA); New Onset Of Congestive Heart Failure; Pleural Effusion; New Onset Atrial Flutter; Atrial Flutter With Rapid Ventricular Response; Mild Mitral Valve Regurgitation; Tricuspid Valvular Regurgitation; Cystoid Macular Edema, Left Eye; History of Retinal Detachment; and Primary Hypertension.  History of Hypertension treated with Metoprolol  succinate 50 mg daily, Cardizem  300 mg daily. Dependent Edema treated with Lasix  20 mg daily. Blood Pressure: normotensive today at 120/70.   History of Cystoid Macular Edema, currently stable, followed by [name].  History of Atrial Flutter, S/p TEE and Cardioversion, managed on Eliquis  5 mg twice daily.   History of Hyperlipidemia treated with Rosuvastatin  5 mg daily. 05/16/2024 Lipid Panel: Triglycerides 158, elevated from 154 in 05/2023; otherwise WNL.   History of GERD treated with Famotidine  20 mg as needed.  Labs 05/16/2024 CBC: WNL CMP: WNL  Colonoscopy 04/01/2019 by Dr. Aneita found External Hemorrhoids, Hemorrhoidal Tags on perianal exam; multiple Medium-mouthed Diverticula in the left colon; exam normal on direct and retroflexion views.  History of Benign  Prostatic Hyperplasia followed by Dr. Renay Shawn, Urologist, last seen 02/2024 with annual f/u. PSA  6.20  05/16/2024. Denies difficulty urinating.   Vaccine Counseling: Due for Shingles 1/2 and Tdap - says he likely has gotten his Tdap updated at [facility], will check, and declined Shingrix; UTD on Flu and PNA. Past Medical History:  Diagnosis Date   Allergy    seasonal   BPH (benign prostatic hypertrophy)    Cataract    bilateral-removed back in 90's   Eczema    Hyperlipidemia    Peyronie's disease    Retinal tear 2000's   bilateral-surgery to repair.   Urticaria    Medical/Surgical History Narrative:   Allergic/Intolerant to: Wellbutrin/Bupropion HCL - rash  2023 - Acute Covid-19 in February  2021 - hospitalized 10/29 - 11/02 with Atypical PNA and Atrial Flutter.   2001 - Retinal Detachment Repair in June  1994 - Bilateral Cataract Extraction   1949 - Tonsillectomy  Other - Hx of: Positional Vertigo w/ episodes of Cervical Radiculopathy Past Surgical History:  Procedure Laterality Date   BUBBLE STUDY  09/06/2020   Procedure: BUBBLE STUDY;  Surgeon: Shawn Pacific, MD;  Location: [facility] ENDOSCOPY;  Service: Cardiovascular;;   CARDIOVERSION N/A 09/06/2020   Procedure: CARDIOVERSION;  Surgeon: Shawn Pacific, MD;  Location: [facility] ENDOSCOPY;  Service: Cardiovascular;  Laterality: N/A;   COLONOSCOPY     EYE SURGERY Bilateral 06/08   retinal detachment OS   EYE SURGERY Bilateral 1994   cataract OU   TEE WITHOUT CARDIOVERSION N/A 09/06/2020   Procedure: TRANSESOPHAGEAL ECHOCARDIOGRAM (TEE);  Surgeon: Shawn Pacific, MD;  Location: [facility] ENDOSCOPY;  Service: Cardiovascular;  Laterality: N/A;   TONSILLECTOMY     Family History  Problem Relation Age of Onset   Cancer Father  back muscle    Other Father        pituatary tumor   Lung cancer Father    Colon polyps Mother    High blood pressure Brother    Colon cancer Neg Hx    Esophageal cancer Neg Hx    Rectal cancer Neg Hx    Stomach  cancer Neg Hx    Family History Narrative: No Family History of GI Cancers Father w/ hx of Lung Cancer, Other Cancer (in his back muscle), and Pituitary Tumor Mother w/ hx of Colon Polyps Brother w/ hx of Hypertension Social History   Social History Narrative   Widower - wife deceased due to complications of COPD. 1 Son, deceased in a MVA many years before his wife's passing. Retired from [employer]. Former smoker, as a young adult but quit ~50 years ago. Resides alone. Enjoys spending time outdoors.   Review of Systems  Constitutional:  Negative for chills, fever, malaise/fatigue and weight loss.  HENT:  Negative for hearing loss, sinus pain and sore throat.   Respiratory:  Negative for cough, hemoptysis and shortness of breath.   Cardiovascular:  Negative for chest pain, palpitations, leg swelling and PND.  Gastrointestinal:  Negative for abdominal pain, constipation, diarrhea, heartburn, nausea and vomiting.  Genitourinary:  Negative for dysuria, frequency and urgency.  Musculoskeletal:  Negative for back pain, myalgias and neck pain.  Skin:  Negative for itching and rash.  Neurological:  Negative for dizziness, tingling, seizures and headaches.  Endo/Heme/Allergies:  Negative for polydipsia.  Psychiatric/Behavioral:  Negative for depression. The patient is not nervous/anxious.   All other systems reviewed and are negative.   Objective:  Vitals: BP 120/70   Pulse 71   Ht 6' 1 (1.854 m)   Wt 187 lb (84.8 kg)   SpO2 96%   BMI 24.67 kg/m  Physical Exam Vitals and nursing note reviewed.  Constitutional:      General: He is awake. He is not in acute distress.    Appearance: Normal appearance. He is not ill-appearing or toxic-appearing.  HENT:     Head: Normocephalic and atraumatic.     Right Ear: Tympanic membrane, ear canal and external ear normal.     Left Ear: Tympanic membrane, ear canal and external ear normal.     Mouth/Throat:     Pharynx: Oropharynx is clear.   Eyes:     Extraocular Movements: Extraocular movements intact.     Pupils: Pupils are equal, round, and reactive to light.  Neck:     Thyroid : No thyroid  mass, thyromegaly or thyroid  tenderness.     Vascular: No carotid bruit.  Cardiovascular:     Rate and Rhythm: Normal rate and regular rhythm. No extrasystoles are present.    Pulses:          Dorsalis pedis pulses are 2+ on the right side and 2+ on the left side.       Posterior tibial pulses are 2+ on the right side and 2+ on the left side.     Heart sounds: Normal heart sounds. No murmur heard.    No friction rub. No gallop.  Pulmonary:     Effort: Pulmonary effort is normal.     Breath sounds: Normal breath sounds. No decreased breath sounds, wheezing, rhonchi or rales.  Chest:     Chest wall: No mass.  Abdominal:     Palpations: Abdomen is soft. There is no hepatomegaly, splenomegaly or mass.     Tenderness: There is  no abdominal tenderness.     Hernia: No hernia is present.  Genitourinary:    Comments: Prostate exam deferred to Urologist Musculoskeletal:     Cervical back: Normal range of motion.     Right lower leg: Edema (mild) present.     Left lower leg: Edema (mild) present.  Lymphadenopathy:     Cervical: No cervical adenopathy.     Upper Body:     Right upper body: No supraclavicular adenopathy.     Left upper body: No supraclavicular adenopathy.  Skin:    General: Skin is warm and dry.  Neurological:     General: No focal deficit present.     Mental Status: He is alert and oriented to person, place, and time. Mental status is at baseline.     Cranial Nerves: Cranial nerves 2-12 are intact.     Sensory: Sensation is intact.     Motor: Motor function is intact.     Coordination: Coordination is intact.     Gait: Gait is intact.     Deep Tendon Reflexes: Reflexes are normal and symmetric.  Psychiatric:        Attention and Perception: Attention normal.        Mood and Affect: Mood normal.        Speech:  Speech normal.        Behavior: Behavior normal. Behavior is cooperative.        Thought Content: Thought content normal.        Cognition and Memory: Cognition and memory normal.        Judgment: Judgment normal.    Most Recent Functional Status Assessment:    05/19/2024   10:01 AM  In your present state of health, do you have any difficulty performing the following activities:  Hearing? 0  Vision? 0  Difficulty concentrating or making decisions? 0  Walking or climbing stairs? 0  Dressing or bathing? 0  Doing errands, shopping? 0  Preparing Food and eating ? N  Using the Toilet? N  In the past six months, have you accidently leaked urine? N  Do you have problems with loss of bowel control? N  Managing your Medications? N  Managing your Finances? N  Housekeeping or managing your Housekeeping? N   Most Recent Fall Risk Assessment:    05/19/2024   10:01 AM  Fall Risk   Falls in the past year? 0  Number falls in past yr: 0  Injury with Fall? 0  Risk for fall due to : No Fall Risks  Follow up Falls prevention discussed;Education provided;Falls evaluation completed   Most Recent Depression Screenings:    05/19/2024   10:03 AM 05/19/2024   10:00 AM  PHQ 2/9 Scores  PHQ - 2 Score 0 0   Most Recent Cognitive Screening:    05/17/2023   10:55 AM  6CIT Screen  What Year? 0 points  What month? 0 points  What time? 0 points  Count back from 20 0 points  Months in reverse 0 points  Repeat phrase 0 points  Total Score 0 points   Results:  Studies Obtained And Personally Reviewed By Me:  Colonoscopy 04/01/2019 by Dr. Aneita found External Hemorrhoids, Hemorrhoidal Tags on perianal exam; multiple Medium-mouthed Diverticula in the left colon; exam normal on direct and retroflexion views.  Labs:     Component Value Date/Time   NA 142 05/16/2024 1108   NA 145 (H) 01/09/2022 1445   K 4.7 05/16/2024 1108   CL  103 05/16/2024 1108   CO2 31 05/16/2024 1108   GLUCOSE 88  05/16/2024 1108   BUN 19 05/16/2024 1108   BUN 15 01/09/2022 1445   CREATININE 1.10 05/16/2024 1108   CALCIUM  9.6 05/16/2024 1108   PROT 7.1 05/16/2024 1108   PROT 6.9 05/03/2018 1030   ALBUMIN 3.2 (L) 09/07/2020 0304   ALBUMIN 4.4 05/03/2018 1030   AST 20 05/16/2024 1108   ALT 14 05/16/2024 1108   ALKPHOS 93 09/03/2020 1738   BILITOT 0.6 05/16/2024 1108   BILITOT 0.5 05/03/2018 1030   GFRNONAA 82 12/16/2020 1143   GFRAA 94 12/16/2020 1143    Lab Results  Component Value Date   WBC 4.3 05/16/2024   HGB 15.0 05/16/2024   HCT 46.3 05/16/2024   MCV 91.3 05/16/2024   PLT 274 05/16/2024   Lab Results  Component Value Date   CHOL 166 05/16/2024   HDL 44 05/16/2024   LDLCALC 97 05/16/2024   TRIG 158 (H) 05/16/2024   CHOLHDL 3.8 05/16/2024   Lab Results  Component Value Date   HGBA1C 5.1 09/07/2020    Lab Results  Component Value Date   TSH 2.527 09/04/2020    Lab Results  Component Value Date   PSA 6.20 (H) 05/16/2024   PSA 5.57 (H) 05/14/2023   PSA 5.08 (H) 04/25/2022     Assessment & Plan:  Other Labs Reviewed today: CBC: WNL CMP: WNL  Hypertension treated with Metoprolol  succinate 50 mg daily, Cardizem  300 mg daily. Dependent Edema treated with Lasix  20 mg daily. Blood Pressure: normotensive today at 120/70.   Cystoid Macular Edema, currently stable, followed by [name].  Atrial Flutter, S/p TEE and Cardioversion, managed on Eliquis  5 mg twice daily.   Hyperlipidemia treated with Rosuvastatin  5 mg daily. 05/16/2024 Lipid Panel: Triglycerides 158, elevated from 154 in 05/2023; otherwise WNL.   GERD treated with Famotidine  20 mg as needed.  Colonoscopy 04/01/2019 by Dr. Aneita found External Hemorrhoids, Hemorrhoidal Tags on perianal exam; multiple Medium-mouthed Diverticula in the left colon; exam normal on direct and retroflexion views.  Benign Prostatic Hyperplasia followed by Dr. Renay Moats, Urologist, last seen 02/2024 with annual f/u. PSA  6.20  05/16/2024.  Denies difficulty urinating.   Vaccine Counseling: Due for Shingles 1/2 and Tdap - says he likely has gotten [facility], will check, and declined Shingrix; UTD on Flu and PNA.  Return in 53 weeks (on 05/25/2025) for annual labs, and then on 05/26/2025 for annual visit, or as needed.   Annual wellness visit done today including the all of the following: Reviewed patient's Family Medical History Reviewed and updated list of patient's medical providers Assessment of cognitive impairment was done Assessed patient's functional ability Established a written schedule for health screening services Health Risk Assessent Completed and Reviewed  Discussed health benefits of physical activity, and encouraged him to engage in regular exercise appropriate for his age and condition.    I,Emily Lagle,acting as a Neurosurgeon for Shawn JINNY Hailstone, MD.,have documented all relevant documentation on the behalf of Shawn JINNY Hailstone, MD,as directed by  Shawn JINNY Hailstone, MD while in the presence of Shawn JINNY Hailstone, MD.   I, Shawn JINNY Hailstone, MD, have reviewed all documentation for this visit. The documentation on 05/19/24 for the exam, diagnosis, procedures, and orders are all accurate and complete.

## 2024-05-19 NOTE — Patient Instructions (Signed)
 It was a pleasure to see you today. Continue current meds and follow up in one year or as needed.

## 2024-05-22 NOTE — Progress Notes (Signed)
 Subjective:   [name] is a 83 y.o. male who presents for Medicare Annual/Subsequent preventive examination.  Visit Complete: In person  Patient Medicare AWV questionnaire was completed by the patient on 05/22/2024; I have confirmed that all information answered by patient is correct and no changes since this date.  Cardiac Risk Factors include: advanced age (>68men, >72 women);dyslipidemia;hypertension;male gender     Objective:    Today's Vitals   05/19/24 0951  BP: 120/70  Pulse: 71  SpO2: 96%  Weight: 187 lb (84.8 kg)  Height: 6' 1 (1.854 m)  PainSc: 0-No pain   Body mass index is 24.67 kg/m.     05/19/2024   10:01 AM 05/17/2023   10:52 AM 05/02/2022    2:56 PM 09/03/2020    9:15 PM 09/03/2020    5:13 PM  Advanced Directives  Does Patient Have a Medical Advance Directive? Yes Yes No No No  Type of Advance Directive Living will;Healthcare Power of [employer] Power of [place];Living will     Does patient want to make changes to medical advance directive?  No - Patient declined     Copy of Healthcare Power of Attorney in Chart? No - copy requested No - copy requested     Would patient like information on creating a medical advance directive?   Yes (MAU/Ambulatory/Procedural Areas - Information given) No - Patient declined     Current Medications (verified) Outpatient Encounter Medications as of 05/19/2024  Medication Sig   diltiazem  (CARDIZEM  CD) 300 MG 24 hr capsule TAKE 1 CAPSULE BY MOUTH EVERY DAY   ELIQUIS  5 MG TABS tablet TAKE 1 TABLET BY MOUTH TWICE A DAY   famotidine  (PEPCID ) 20 MG tablet Take 1 tablet (20 mg total) by mouth daily. (Patient taking differently: Take 20 mg by mouth as needed.)   furosemide  (LASIX ) 20 MG tablet TAKE 1 TABLET BY MOUTH EVERY DAY   HYDROcodone  bit-homatropine (HYCODAN) 5-1.5 MG/5ML syrup Take 5 mLs by mouth every 8 (eight) hours as needed for cough.   metoprolol  succinate (TOPROL -XL) 50 MG 24 hr tablet TAKE 1 TABLET  BY MOUTH EVERY DAY   rosuvastatin  (CRESTOR ) 5 MG tablet TAKE 1 TABLET (5 MG TOTAL) BY MOUTH DAILY.   No facility-administered encounter medications on file as of 05/19/2024.    Allergies (verified) Wellbutrin [bupropion hcl]   History: Past Medical History:  Diagnosis Date   Allergy    seasonal   BPH (benign prostatic hypertrophy)    Cataract    bilateral-removed back in 90's   Eczema    Hyperlipidemia    Peyronie's disease    Retinal tear 2000's   bilateral-surgery to repair.   Urticaria    Past Surgical History:  Procedure Laterality Date   BUBBLE STUDY  09/06/2020   Procedure: BUBBLE STUDY;  Surgeon: Claudene Pacific, MD;  Location: [facility] ENDOSCOPY;  Service: Cardiovascular;;   CARDIOVERSION N/A 09/06/2020   Procedure: CARDIOVERSION;  Surgeon: Claudene Pacific, MD;  Location: [facility] ENDOSCOPY;  Service: Cardiovascular;  Laterality: N/A;   COLONOSCOPY     EYE SURGERY Bilateral 06/08   retinal detachment OS   EYE SURGERY Bilateral 1994   cataract OU   TEE WITHOUT CARDIOVERSION N/A 09/06/2020   Procedure: TRANSESOPHAGEAL ECHOCARDIOGRAM (TEE);  Surgeon: Claudene Pacific, MD;  Location: [facility] ENDOSCOPY;  Service: Cardiovascular;  Laterality: N/A;   TONSILLECTOMY     Family History  Problem Relation Age of Onset   Cancer Father        back muscle  Other Father        pituatary tumor   Lung cancer Father    Colon polyps Mother    High blood pressure Brother    Colon cancer Neg Hx    Esophageal cancer Neg Hx    Rectal cancer Neg Hx    Stomach cancer Neg Hx    Social History   Socioeconomic History   Marital status: Widowed    Spouse name: Not on file   Number of children: 1   Years of education: Not on file   Highest education level: Not on file  Occupational History   Occupation: retired    Associate Professor: RETIRED  Tobacco Use   Smoking status: Former    Current packs/day: 0.00    Types: Cigarettes    Start date: 11/06/1950    Quit date: 11/06/1970    Years since quitting: 53.5    Smokeless tobacco: Never  Vaping Use   Vaping status: Never Used  Substance and Sexual Activity   Alcohol use: Not Currently   Drug use: No   Sexual activity: Yes  Other Topics Concern   Not on file  Social History Narrative   Widower - wife deceased due to complications of COPD. 1 Son, deceased in a MVA many years before his wife's passing. Retired from [employer]. Former smoker, as a young adult but quit ~50 years ago. Resides alone. Enjoys spending time outdoors.   Social Drivers of Corporate investment banker Strain: Low Risk  (05/19/2024)   Overall Financial Resource Strain (CARDIA)    Difficulty of Paying Living Expenses: Not hard at all  Food Insecurity: No Food Insecurity (05/19/2024)   Hunger Vital Sign    Worried About Running Out of Food in the Last Year: Never true    Ran Out of Food in the Last Year: Never true  Transportation Needs: No Transportation Needs (05/19/2024)   PRAPARE - Administrator, Civil Service (Medical): No    Lack of Transportation (Non-Medical): No  Physical Activity: Sufficiently Active (05/19/2024)   Exercise Vital Sign    Days of Exercise per Week: 7 days    Minutes of Exercise per Session: 30 min  Stress: No Stress Concern Present (05/19/2024)   [employer] of Occupational Health - Occupational Stress Questionnaire    Feeling of Stress: Not at all  Social Connections: Moderately Integrated (05/19/2024)   Social Connection and Isolation Panel    Frequency of Communication with Friends and Family: Three times a week    Frequency of Social Gatherings with Friends and Family: More than three times a week    Attends Religious Services: More than 4 times per year    Active Member of Clubs or Organizations: Yes    Attends Banker Meetings: More than 4 times per year    Marital Status: Widowed    Tobacco Counseling Counseling given: Not Answered   Clinical Intake:  Pre-visit preparation completed: Yes  Pain :  No/denies pain Pain Score: 0-No pain     BMI - recorded: 24.67 Diabetes: No  How often do you need to have someone help you when you read instructions, pamphlets, or other written materials from your doctor or pharmacy?: 1 - Never  Interpreter Needed?: No  Information entered by :: Kathlynn Porto, CMA   Activities of Daily Living    05/19/2024   10:01 AM  In your present state of health, do you have any difficulty performing the following activities:  Hearing?  0  Vision? 0  Difficulty concentrating or making decisions? 0  Walking or climbing stairs? 0  Dressing or bathing? 0  Doing errands, shopping? 0  Preparing Food and eating ? N  Using the Toilet? N  In the past six months, have you accidently leaked urine? N  Do you have problems with loss of bowel control? N  Managing your Medications? N  Managing your Finances? N  Housekeeping or managing your Housekeeping? N    Patient Care Team: Perri Ronal PARAS, MD as PCP - General (Internal Medicine) Dann Candyce RAMAN, MD as PCP - Cardiology (Cardiology) Nicholaus Tanda LITTIE DOUGLAS, MD as Attending Physician (Urology)  Indicate any recent Medical Services you may have received from other than [facility] providers in the past year (date may be approximate).     Assessment:   This is a routine wellness examination for [place].  Hearing/Vision screen No results found.   Goals Addressed   None    Depression Screen    05/19/2024   10:03 AM 05/19/2024   10:00 AM 05/17/2023   10:50 AM 11/16/2022   10:31 AM 05/02/2022    2:56 PM 01/12/2022    3:59 PM 12/21/2020   11:03 AM  PHQ 2/9 Scores  PHQ - 2 Score 0 0 0 0 0 0 0    Fall Risk    05/19/2024   10:01 AM 05/17/2023   10:53 AM 11/16/2022   10:31 AM 05/02/2022    2:56 PM 01/12/2022    3:59 PM  Fall Risk   Falls in the past year? 0 0 0 0 0  Number falls in past yr: 0 0 0 0 0  Injury with Fall? 0 0 0 0 0  Risk for fall due to : No Fall Risks No Fall Risks No Fall Risks No Fall Risks  No Fall Risks  Follow up Falls prevention discussed;Education provided;Falls evaluation completed  Falls prevention discussed  Falls evaluation completed  Falls evaluation completed      Data saved with a previous flowsheet row definition    MEDICARE RISK AT HOME: Medicare Risk at Home Any stairs in or around the home?: No If so, are there any without handrails?: No Home free of loose throw rugs in walkways, pet beds, electrical cords, etc?: No Adequate lighting in your home to reduce risk of falls?: Yes Life alert?: No Use of a cane, walker or w/c?: No Grab bars in the bathroom?: Yes Shower chair or bench in shower?: Yes Elevated toilet seat or a handicapped toilet?: Yes  TIMED UP AND GO:  Was the test performed?  No    Cognitive Function:        05/17/2023   10:55 AM 05/02/2022    2:57 PM  6CIT Screen  What Year? 0 points 0 points  What month? 0 points 0 points  What time? 0 points 0 points  Count back from 20 0 points 0 points  Months in reverse 0 points 0 points  Repeat phrase 0 points 0 points  Total Score 0 points 0 points    Immunizations Immunization History  Administered Date(s) Administered   Fluad Quad(high Dose 65+) 07/19/2022   Influenza Split 09/21/2011, 11/04/2012   Influenza, Seasonal, Injecte, Preservative Fre 11/07/2019   Influenza,inj,Quad PF,6+ Mos 09/14/2014, 09/02/2015, 08/21/2016, 09/06/2017, 10/16/2018, 10/13/2019   Influenza-Unspecified 07/07/2020   Pneumococcal Conjugate-13 02/07/2017   Pneumococcal Polysaccharide-23 10/20/2011   Tdap 08/03/1998, 12/11/2013, 05/21/2024   Zoster, Live 12/12/2013    TDAP status: Up to  date  Flu Vaccine status: Up to date  Pneumococcal vaccine status: Up to date  Covid-19 vaccine status: Information provided on how to obtain vaccines.   Qualifies for Shingles Vaccine? Yes   Zostavax completed Yes   Shingrix Completed?: No.    Education has been provided regarding the importance of this vaccine.  Patient has been advised to call insurance company to determine out of pocket expense if they have not yet received this vaccine. Advised may also receive vaccine at local pharmacy or Health Dept. Verbalized acceptance and understanding.  Screening Tests Health Maintenance  Topic Date Due   Zoster Vaccines- Shingrix (1 of 2) 08/19/2024 (Originally 01/06/1960)   INFLUENZA VACCINE  06/06/2024   Medicare Annual Wellness (AWV)  05/19/2025   DTaP/Tdap/Td (4 - Td or Tdap) 05/21/2034   Pneumococcal Vaccine: 50+ Years  Completed   Hepatitis B Vaccines  Aged Out   HPV VACCINES  Aged Out   Meningococcal B Vaccine  Aged Out   COVID-19 Vaccine  Discontinued    Health Maintenance  There are no preventive care reminders to display for this patient.  Colorectal cancer screening: No longer required.   Lung Cancer Screening: (Low Dose CT Chest recommended if Age 12-80 years, 20 pack-year currently smoking OR have quit w/in 15years.) does not qualify.    Additional Screening:  Hepatitis C Screening: does not qualify; Completed   Vision Screening: Recommended annual ophthalmology exams for early detection of glaucoma and other disorders of the eye. Is the patient up to date with their annual eye exam?  Yes  Who is the provider or what is the name of the office in which the patient attends annual eye exams? Arley Ruder, MD If pt is not established with a provider, would they like to be referred to a provider to establish care? No .   Dental Screening: Recommended annual dental exams for proper oral hygiene   Community Resource Referral / Chronic Care Management: CRR required this visit?  No   CCM required this visit?  No     Plan:     I have personally reviewed and noted the following in the patient's chart:   Medical and social history Use of alcohol, tobacco or illicit drugs  Current medications and supplements including opioid prescriptions. Patient is not currently taking opioid  prescriptions. Functional ability and status Nutritional status Physical activity Advanced directives List of other physicians Hospitalizations, surgeries, and ER visits in previous 12 months Vitals Screenings to include cognitive, depression, and falls Referrals and appointments  In addition, I have reviewed and discussed with patient certain preventive protocols, quality metrics, and best practice recommendations. A written personalized care plan for preventive services as well as general preventive health recommendations were provided to patient.     Nelsy Madonna Zelda, CMA   05/22/2024   After Visit Summary: (In Person-Printed) AVS printed and given to the patient

## 2024-05-22 NOTE — Addendum Note (Signed)
 Addended by: Shaynah Hund P on: 05/22/2024 05:18 PM   Modules accepted: Orders

## 2024-07-11 ENCOUNTER — Other Ambulatory Visit: Payer: Self-pay | Admitting: Internal Medicine

## 2024-07-22 ENCOUNTER — Ambulatory Visit: Attending: Cardiology | Admitting: Cardiology

## 2024-07-22 ENCOUNTER — Encounter: Payer: Self-pay | Admitting: Cardiology

## 2024-07-22 VITALS — BP 124/60 | HR 73 | Resp 16 | Ht 73.0 in | Wt 188.2 lb

## 2024-07-22 DIAGNOSIS — I38 Endocarditis, valve unspecified: Secondary | ICD-10-CM

## 2024-07-22 DIAGNOSIS — I700 Atherosclerosis of aorta: Secondary | ICD-10-CM

## 2024-07-22 DIAGNOSIS — I4892 Unspecified atrial flutter: Secondary | ICD-10-CM

## 2024-07-22 DIAGNOSIS — R9431 Abnormal electrocardiogram [ECG] [EKG]: Secondary | ICD-10-CM | POA: Diagnosis not present

## 2024-07-22 DIAGNOSIS — I10 Essential (primary) hypertension: Secondary | ICD-10-CM

## 2024-07-22 DIAGNOSIS — I2510 Atherosclerotic heart disease of native coronary artery without angina pectoris: Secondary | ICD-10-CM | POA: Diagnosis not present

## 2024-07-22 NOTE — Patient Instructions (Signed)
 Medication Instructions:  Your physician has recommended you make the following change in your medication:  DECREASE: metoprolol  succinate to 25 mg once daily for 2 weeks   Then take every other day for 2 weeks   Then STOP  *If you need a refill on your cardiac medications before your next appointment, please call your pharmacy*  Lab Work: NONE  If you have labs (blood work) drawn today and your tests are completely normal, you will receive your results only by: MyChart Message (if you have MyChart) OR A paper copy in the mail If you have any lab test that is abnormal or we need to change your treatment, we will call you to review the results.  Testing/Procedures: Your physician has requested that you have an echocardiogram. Echocardiography is a painless test that uses sound waves to create images of your heart. It provides your doctor with information about the size and shape of your heart and how well your heart's chambers and valves are working. This procedure takes approximately one hour. There are no restrictions for this procedure. Please do NOT wear cologne, perfume, aftershave, or lotions (deodorant is allowed). Please arrive 15 minutes prior to your appointment time.  Please note: We ask at that you not bring children with you during ultrasound (echo/ vascular) testing. Due to room size and safety concerns, children are not allowed in the ultrasound rooms during exams. Our front office staff cannot provide observation of children in our lobby area while testing is being conducted. An adult accompanying a patient to their appointment will only be allowed in the ultrasound room at the discretion of the ultrasound technician under special circumstances. We apologize for any inconvenience.   Follow-Up: At [facility], you and your health needs are our priority.  As part of our continuing mission to provide you with exceptional heart care, our providers are all part of one  team.  This team includes your primary Cardiologist (physician) and Advanced Practice Providers or APPs (Physician Assistants and Nurse Practitioners) who all work together to provide you with the care you need, when you need it.  Your next appointment:   3 month(s)  Provider:   Madonna Large, DO

## 2024-07-22 NOTE — Progress Notes (Signed)
 Cardiology Office Note:  .   Date:  07/22/2024  ID:  [name], DOB 1941-05-20, MRN [id number] PCP:  No primary care provider on file.  Former Cardiology Providers: Dr. Candyce Reek [facility] Providers Cardiologist:  Shawn Large, DO , [facility] (established care 07/22/24) Electrophysiologist:  None  Click to update primary MD,subspecialty MD or APP then REFRESH:1}    Chief Complaint  Patient presents with   Follow-up    Establish care, atrial flutter    History of Present Illness: .   [name] is a 83 y.o. Caucasian male whose past medical history and cardiovascular risk factors includes: Persistent atrial flutter status post cardioversion, mitral and tricuspid regurgitation, hypertension, minimal coronary artery calcification (CT 08/2020), aortic atherosclerosis.  Formally under the care of Dr. Candyce Reek, who last saw [facility] back in 08/2022. I am seeing him for the first time to re-establishing care.  He was last seen by Artist Pouch in October 2024.    Patient had an episode of atrial flutter and was treated with direct-current cardioversion and has not had reoccurrence of atrial flutter thereafter.  He has been maintained on metoprolol , diltiazem , and Eliquis  for thromboembolic prophylaxis.  At the last office visit due to fatigue metoprolol  was reduced from 100 mg p.o. daily to 50 mg p.o. daily.  He remained on diltiazem  as well as Eliquis .  Since last office visit he has not had any hospitalizations or urgent care visits for cardiovascular reasons.  Denies anginal chest pain or heart failure symptoms.  He has 25 acres of land which he maintains regularly.  Patient states that he was outside yesterday for 5 hours without any exertional symptoms.  Patient expresses a desire to come off of metoprolol  since his fatigue has improved.  Does not endorse evidence of bleeding. Outside labs from July 2025 independently reviewed via [facility] database.  Review  of Systems: .   Review of Systems  Cardiovascular:  Negative for chest pain, claudication, irregular heartbeat, leg swelling, near-syncope, orthopnea, palpitations, paroxysmal nocturnal dyspnea and syncope.  Respiratory:  Negative for shortness of breath.   Hematologic/Lymphatic: Negative for bleeding problem.    Studies Reviewed:   EKG: EKG Interpretation Date/Time:  Tuesday July 22 2024 11:04:18 EDT Ventricular Rate:  70 PR Interval:  206 QRS Duration:  104 QT Interval:  386 QTC Calculation: 416 R Axis:   74  Text Interpretation: Sinus rhythm with occasional Premature ventricular complexes T wave abnormality, consider inferior ischemia When compared with ECG of 05-Sep-2023 10:32, Premature ventricular complexes are now Present T wave inversion now evident in Inferior leads Confirmed by Davidson Shawn [phone]) on 07/22/2024 11:25:18 AM  Echocardiogram: 08/2020 LVEF 60 to 65%. Right ventricular size and function normal. Left atrium moderately dilated. Right atrium severely dilated. Mild to moderate mitral regurgitation. Severe tricuspid regurgitation. Estimated RAP 15 mmHg  Stress Testing: N/A  RADIOLOGY: N/A  Risk Assessment/Calculations:   Click Here to Calculate/Change CHADS2VASc Score The patient's CHADS2-VASc score is 4, indicating a 4.8% annual risk of stroke.   CHF History: No HTN History: Yes Diabetes History: No Stroke History: No Vascular Disease History: Yes    Labs:       Latest Ref Rng & Units 05/16/2024   11:08 AM 05/14/2023    9:56 AM 04/25/2022    9:19 AM  CBC  WBC 3.8 - 10.8 Thousand/uL 4.3  5.1  5.7   Hemoglobin 13.2 - 17.1 g/dL 84.9  84.9  84.0   Hematocrit 38.5 -  50.0 % 46.3  44.9  46.8   Platelets 140 - 400 Thousand/uL 274  245  285        Latest Ref Rng & Units 05/16/2024   11:08 AM 05/14/2023    9:56 AM 04/25/2022    9:19 AM  BMP  Glucose 65 - 99 mg/dL 88  90  90   BUN 7 - 25 mg/dL 19  19  20    Creatinine 0.70 - 1.22 mg/dL 8.89   9.02  8.99   BUN/Creat Ratio 6 - 22 (calc) SEE NOTE:  SEE NOTE:  NOT APPLICABLE   Sodium 135 - 146 mmol/L 142  141  143   Potassium 3.5 - 5.3 mmol/L 4.7  4.6  4.6   Chloride 98 - 110 mmol/L 103  104  105   CO2 20 - 32 mmol/L 31  32  29   Calcium  8.6 - 10.3 mg/dL 9.6  9.7  9.8       Latest Ref Rng & Units 05/16/2024   11:08 AM 05/14/2023    9:56 AM 11/14/2022   10:03 AM  CMP  Glucose 65 - 99 mg/dL 88  90    BUN 7 - 25 mg/dL 19  19    Creatinine 9.29 - 1.22 mg/dL 8.89  9.02    Sodium 864 - 146 mmol/L 142  141    Potassium 3.5 - 5.3 mmol/L 4.7  4.6    Chloride 98 - 110 mmol/L 103  104    CO2 20 - 32 mmol/L 31  32    Calcium  8.6 - 10.3 mg/dL 9.6  9.7    Total Protein 6.1 - 8.1 g/dL 7.1  7.3  7.7   Total Bilirubin 0.2 - 1.2 mg/dL 0.6  0.6  0.6   AST 10 - 35 U/L 20  16  18    ALT 9 - 46 U/L 14  12  17      Lab Results  Component Value Date   CHOL 166 05/16/2024   HDL 44 05/16/2024   LDLCALC 97 05/16/2024   TRIG 158 (H) 05/16/2024   CHOLHDL 3.8 05/16/2024   No results for input(s): LIPOA in the last 8760 hours. No components found for: NTPROBNP No results for input(s): PROBNP in the last 8760 hours. No results for input(s): TSH in the last 8760 hours.  Physical Exam:    Today's Vitals   07/22/24 1101  BP: 124/60  Pulse: 73  Resp: 16  SpO2: 97%  Weight: 188 lb 3.2 oz (85.4 kg)  Height: 6' 1 (1.854 m)   Body mass index is 24.83 kg/m. Wt Readings from Last 3 Encounters:  07/22/24 188 lb 3.2 oz (85.4 kg)  05/19/24 187 lb (84.8 kg)  09/05/23 182 lb 3.2 oz (82.6 kg)    Physical Exam  Constitutional: No distress.  hemodynamically stable  Neck: No JVD present.  Cardiovascular: Normal rate, regular rhythm, S1 normal and S2 normal. Exam reveals no gallop, no S3 and no S4.  Murmur heard. Holosystolic murmur is present with a grade of 3/6 at the lower left sternal border. Pulmonary/Chest: Effort normal and breath sounds normal. No stridor. He has no wheezes. He has no  rales.  Musculoskeletal:        General: Edema present.     Cervical back: Neck supple.  Skin: Skin is warm.     Impression & Recommendation(s):  Impression:   ICD-10-CM   1. Atrial flutter, persistent, history of cardioversion  I48.92 EKG 12-Lead  ECHOCARDIOGRAM COMPLETE    2. Nonspecific abnormal electrocardiogram (ECG) (EKG)  R94.31 ECHOCARDIOGRAM COMPLETE    3. Coronary artery calcification seen on CAT scan  I25.10     4. Atherosclerosis of aorta (HCC)  I70.0     5. Valvular heart disease  I38     6. Benign hypertension  I10        Recommendation(s):  Atrial flutter, persistent, history of cardioversion Rate control: Cardizem  and metoprolol . Rhythm control: N/A. Thromboembolic prophylaxis: Eliquis  History of direct-current cardioversion. Clinically has felt better with down titration of Toprol -XL from 100 mg to 50 mg p.o. daily. Patient desires to be discontinued from Toprol -XL. Since he has been on this for many years recommend weaning off. Week #1 and 2: Toprol -XL 25 mg p.o. daily Week #3 and 4: Toprol -XL 25 mg p.o. q. other day Week 5: Stop Monitor pulse and symptoms of palpitation since down titration of AV nodal blocking agents.  Nonspecific abnormal electrocardiogram (ECG) (EKG) Coronary artery calcification seen on CAT scan Atherosclerosis of aorta (HCC) Denies anginal chest pain or heart failure symptoms. Overall functional capacity remains stable. Routine EKG illustrates sinus rhythm with T wave abnormality in inferior leads concerning for possible ischemia.  ECG reviewed with him to provide patient further clarity between today's and prior EKGs Since he is asymptomatic we will hold off on stress testing at this time. Patient is asked to be more cognizant of symptoms of chest pain or shortness of breath with exertion and if present to call us  back for further guidance Plan echocardiogram to evaluate for LVEF and regional wall motion abnormalities to see  if ischemic workup needs to be expedited  Valvular heart disease Known history of severe tricuspid regurgitation and mild to moderate mitral regurgitation. Cardiac murmur auscultated on physical examination. Last echocardiogram in 2021, repeat surface echo  Benign hypertension Office blood pressures are very well-controlled. Continue current medications. Reemphasized importance of low-salt diet.  Transitioning care reviewed records from Dr. Candyce Reek as well as Artist Fallow. EKG 07/22/2024 independently reviewed. Labs 05/16/2024 [facility] database independently reviewed Echocardiogram October 2021 reviewed Prescription drug management Ordered additional diagnostic workup Shared decision making with regards to EKG findings/workup Coordination of care and patient education provided as well  Orders Placed:  Orders Placed This Encounter  Procedures   EKG 12-Lead   ECHOCARDIOGRAM COMPLETE    Standing Status:   Future    Expiration Date:   07/22/2025    [facility] this [facility] performed[facility]    Does the patient weigh less than or greater than 250 lbs?:   Patient weighs less than 250 lbs    Perflutren DEFINITY (image enhancing agent) should be administered unless hypersensitivity or allergy exist:   Administer Perflutren    Reason for exam-Echo:   Other-Full Diagnosis List    Full ICD-10/Reason for Exam:   Abnormal EKG [276271]    Full ICD-10/Reason for Exam:   Flutter-fibrillation [facility]) [832783]     Final Medication List:   No orders of the defined types were placed in this encounter.   Medications Discontinued During This Encounter  Medication Reason   metoprolol  succinate (TOPROL -XL) 50 MG 24 hr tablet Discontinued by provider     Current Outpatient Medications:    diltiazem  (CARDIZEM  CD) 300 MG 24 hr capsule, TAKE 1 CAPSULE BY MOUTH EVERY DAY, Disp: 90 capsule, Rfl: 2   ELIQUIS  5 MG TABS tablet, TAKE 1 TABLET BY MOUTH TWICE A DAY, Disp: 60 tablet,  Rfl:  5   famotidine  (PEPCID ) 20 MG tablet, Take 1 tablet (20 mg total) by mouth daily. (Patient taking differently: Take 20 mg by mouth as needed.), Disp: 30 tablet, Rfl: 4   furosemide  (LASIX ) 20 MG tablet, TAKE 1 TABLET BY MOUTH EVERY DAY, Disp: 90 tablet, Rfl: 2   HYDROcodone  bit-homatropine (HYCODAN) 5-1.5 MG/5ML syrup, Take 5 mLs by mouth every 8 (eight) hours as needed for cough., Disp: 120 mL, Rfl: 0   rosuvastatin  (CRESTOR ) 5 MG tablet, TAKE 1 TABLET (5 MG TOTAL) BY MOUTH DAILY., Disp: 90 tablet, Rfl: 3  Consent:   NA  Disposition:   3 months follow-up sooner if needed  His questions and concerns were addressed to his satisfaction. He voices understanding of the recommendations provided during this encounter.    Signed, Shawn Michele HAS, [facility]  A Division of [facility] [address] [place], [place] [address]  07/22/2024 12:34 PM

## 2024-08-21 ENCOUNTER — Ambulatory Visit (HOSPITAL_COMMUNITY)
Admission: RE | Admit: 2024-08-21 | Discharge: 2024-08-21 | Disposition: A | Discharge status: Home / Self Care | Source: Ambulatory Visit | Attending: Cardiology | Admitting: Cardiology

## 2024-08-21 DIAGNOSIS — I4892 Unspecified atrial flutter: Secondary | ICD-10-CM | POA: Diagnosis not present

## 2024-08-21 DIAGNOSIS — R9431 Abnormal electrocardiogram [ECG] [EKG]: Secondary | ICD-10-CM | POA: Diagnosis not present

## 2024-08-21 LAB — ECHOCARDIOGRAM COMPLETE
AR max vel: 2.62 cm2
AV Area VTI: 2.78 cm2
AV Area mean vel: 2.51 cm2
AV Mean grad: 3 mmHg
AV Peak grad: 5.2 mmHg
Ao pk vel: 1.14 m/s
Area-P 1/2: 2.73 cm2
P 1/2 time: 427 ms
S' Lateral: 2.9 cm

## 2024-08-30 ENCOUNTER — Ambulatory Visit: Payer: Self-pay | Admitting: Cardiology

## 2024-10-21 ENCOUNTER — Encounter: Payer: Self-pay | Admitting: Cardiology

## 2024-10-21 ENCOUNTER — Ambulatory Visit: Admitting: Cardiology

## 2024-10-21 VITALS — BP 118/62 | HR 85 | Ht 73.0 in | Wt 188.0 lb

## 2024-10-21 DIAGNOSIS — I4892 Unspecified atrial flutter: Secondary | ICD-10-CM

## 2024-10-21 DIAGNOSIS — I77810 Thoracic aortic ectasia: Secondary | ICD-10-CM

## 2024-10-21 DIAGNOSIS — I2510 Atherosclerotic heart disease of native coronary artery without angina pectoris: Secondary | ICD-10-CM

## 2024-10-21 DIAGNOSIS — I10 Essential (primary) hypertension: Secondary | ICD-10-CM

## 2024-10-21 DIAGNOSIS — I700 Atherosclerosis of aorta: Secondary | ICD-10-CM

## 2024-10-21 NOTE — Progress Notes (Signed)
 Cardiology Office Note:  .   Date:  10/21/2024  ID:  [name], DOB December 06, 1940, MRN [id number] PCP:  Perri Ronal PARAS, MD  Former Cardiology Providers: Dr. Candyce Reek [facility] Providers Cardiologist:  Madonna Large, DO , [facility] (established care 07/22/24) Electrophysiologist:  None  Click to update primary MD,subspecialty MD or APP then REFRESH:1}    Chief Complaint  Patient presents with   Follow-up    Atrial flutter and review test results    History of Present Illness: .   URHO Davidson is a 83 y.o. Caucasian male whose past medical history and cardiovascular risk factors includes: Persistent atrial flutter status post cardioversion, mitral and tricuspid regurgitation, hypertension, minimal coronary artery calcification (CT 08/2020), aortic atherosclerosis.  Patient formally under the care of Dr. Candyce Reek and reestablish care with myself back in September 2025.  Patient had an episode of atrial flutter and was treated with direct-current cardioversion and has not had reoccurrence of atrial flutter thereafter.  He has been maintained on metoprolol , diltiazem , and Eliquis  for thromboembolic prophylaxis.  Due to fatigue he has a dose of metoprolol  in the past reduced from 100 mg to 50 mg p.o. daily.  Last office visit patient was doing well from a cardiovascular standpoint.  Physical endurance also remained well taking care of his 25 acres of land.  Patient requested weaning off of Toprol -XL.  Echocardiogram was performed noted preserved LVEF, grade 1 diastolic dysfunction, and mild dilatation of the aortic root at 41 mm.  Patient presents today for follow-up.  Since last office visit Josede denies any anginal chest pain or heart failure symptoms.   No hospitalizations or urgent care visits for cardiovascular reasons.   He has been compliant with his medical therapy and endorses no concerns.  Patient feels better after transitioning off of  metoprolol . Physical endurance remains stable  Review of Systems: .   Review of Systems  Cardiovascular:  Negative for chest pain, claudication, irregular heartbeat, leg swelling, near-syncope, orthopnea, palpitations, paroxysmal nocturnal dyspnea and syncope.  Respiratory:  Negative for shortness of breath.   Hematologic/Lymphatic: Negative for bleeding problem.    Studies Reviewed:   EKG: 07/22/2024: Sinus rhythm, 70 bpm, PVC rare, nonspecific ST-T changes  Echocardiogram: 08/2020: LVEF 60 to 65%.  08/21/24 1. Basal inferior/inferoseptal hypokinesis.. Left ventricular ejection fraction, by estimation, is 60 to 65%. The left ventricle has normal function. Left ventricular diastolic parameters are consistent with Grade I diastolic dysfunction (impaired  relaxation). 2. Right ventricular systolic function is normal. The right ventricular size is normal. 3. Left atrial size was mildly dilated. 4. The mitral valve is normal in structure. Trivial mitral valve regurgitation. 5. The aortic valve is tricuspid. Aortic valve regurgitation is mild. 6. Aortic dilatation noted. There is mild dilatation of the aortic root, measuring 41 mm.  Comparison(s): The left ventricular function is unchanged. EF 55%, mild AI, moderate TR.   Stress Testing: N/A  RADIOLOGY: N/A  Risk Assessment/Calculations:   Click Here to Calculate/Change CHADS2VASc Score The patient's CHADS2-VASc score is 4, indicating a 4.8% annual risk of stroke.   CHF History: No HTN History: Yes Diabetes History: No Stroke History: No Vascular Disease History: Yes    Labs:       Latest Ref Rng & Units 05/16/2024   11:08 AM 05/14/2023    9:56 AM 04/25/2022    9:19 AM  CBC  WBC 3.8 - 10.8 Thousand/uL 4.3  5.1  5.7   Hemoglobin 13.2 - 17.1 g/dL  15.0  15.0  15.9   Hematocrit 38.5 - 50.0 % 46.3  44.9  46.8   Platelets 140 - 400 Thousand/uL 274  245  285        Latest Ref Rng & Units 05/16/2024   11:08 AM 05/14/2023     9:56 AM 04/25/2022    9:19 AM  BMP  Glucose 65 - 99 mg/dL 88  90  90   BUN 7 - 25 mg/dL 19  19  20    Creatinine 0.70 - 1.22 mg/dL 8.89  9.02  8.99   BUN/Creat Ratio 6 - 22 (calc) SEE NOTE:  SEE NOTE:  NOT APPLICABLE   Sodium 135 - 146 mmol/L 142  141  143   Potassium 3.5 - 5.3 mmol/L 4.7  4.6  4.6   Chloride 98 - 110 mmol/L 103  104  105   CO2 20 - 32 mmol/L 31  32  29   Calcium  8.6 - 10.3 mg/dL 9.6  9.7  9.8       Latest Ref Rng & Units 05/16/2024   11:08 AM 05/14/2023    9:56 AM 11/14/2022   10:03 AM  CMP  Glucose 65 - 99 mg/dL 88  90    BUN 7 - 25 mg/dL 19  19    Creatinine 9.29 - 1.22 mg/dL 8.89  9.02    Sodium 864 - 146 mmol/L 142  141    Potassium 3.5 - 5.3 mmol/L 4.7  4.6    Chloride 98 - 110 mmol/L 103  104    CO2 20 - 32 mmol/L 31  32    Calcium  8.6 - 10.3 mg/dL 9.6  9.7    Total Protein 6.1 - 8.1 g/dL 7.1  7.3  7.7   Total Bilirubin 0.2 - 1.2 mg/dL 0.6  0.6  0.6   AST 10 - 35 U/L 20  16  18    ALT 9 - 46 U/L 14  12  17      Lab Results  Component Value Date   CHOL 166 05/16/2024   HDL 44 05/16/2024   LDLCALC 97 05/16/2024   TRIG 158 (H) 05/16/2024   CHOLHDL 3.8 05/16/2024   No results for input(s): LIPOA in the last 8760 hours. No components found for: NTPROBNP No results for input(s): PROBNP in the last 8760 hours. No results for input(s): TSH in the last 8760 hours.  Physical Exam:    Today's Vitals   10/21/24 1259  BP: 118/62  Pulse: 85  SpO2: 96%  Weight: 188 lb (85.3 kg)  Height: 6' 1 (1.854 m)   Body mass index is 24.8 kg/m. Wt Readings from Last 3 Encounters:  10/21/24 188 lb (85.3 kg)  07/22/24 188 lb 3.2 oz (85.4 kg)  05/19/24 187 lb (84.8 kg)    Physical Exam  Constitutional: No distress.  hemodynamically stable  Neck: No JVD present.  Cardiovascular: Normal rate, regular rhythm, S1 normal and S2 normal. Exam reveals no gallop, no S3 and no S4.  Murmur heard. Holosystolic murmur is present with a grade of 3/6 at the lower left  sternal border. Pulmonary/Chest: Effort normal and breath sounds normal. No stridor. He has no wheezes. He has no rales.  Musculoskeletal:        General: No edema.     Cervical back: Neck supple.  Skin: Skin is warm.    Impression & Recommendation(s):  Impression:   ICD-10-CM   1. Atrial flutter, persistent, history of cardioversion  I48.92 Hemoglobin and  hematocrit, blood    Basic Metabolic Panel (BMET)    2. Aortic root dilatation  I77.810 ECHOCARDIOGRAM COMPLETE    3. Coronary artery calcification seen on CAT scan  I25.10     4. Atherosclerosis of aorta  I70.0     5. Benign hypertension  I10      Recommendation(s):  Atrial flutter, persistent, history of cardioversion Rate control: Cardizem  Rhythm control: N/A. Thromboembolic prophylaxis: Eliquis  History of direct-current cardioversion.   Weaned off of Toprol -XL due to fatigue. Patient is made aware that his hemoglobin and kidney function needs to be checked every 6 months. He has follow-up appointment with PCP in July 2026 and will send us  a copy of the results. And I will check his labs in December 2026 prior to his appointment  Aortic root dilatation: Incidentally noted on surface echocardiogram. May be within normal limits given his BSA.  Will repeat an echocardiogram in 1 year to reevaluate progression Blood pressures are very well-controlled.  Coronary artery calcification seen on CAT scan Atherosclerosis of aorta (HCC) Denies anginal chest pain or heart failure symptoms Prior EKG noted subtle ST-T changes in the inferior leads. Repeat echocardiogram noted preserved LVEF with basal inferior inferoseptal hypokinesis.  Independently reviewed the images no obvious regional wall motion abnormalities noted.  Clinically physical endurance remains relatively stable and he denies anginal chest pain or other symptoms.  Hold off on additional testing since he is asymptomatic and LV is preserved.  Benign  hypertension Office blood pressures are very well-controlled. Continue current medications. Reemphasized importance of low-salt diet.  Orders Placed:  Orders Placed This Encounter  Procedures   Hemoglobin and hematocrit, blood    Standing Status:   Future    Expected Date:   10/06/2025    Expiration Date:   01/04/2026   Basic Metabolic Panel (BMET)    Standing Status:   Future    Expected Date:   10/06/2025    Expiration Date:   01/04/2026   ECHOCARDIOGRAM COMPLETE    Standing Status:   Future    Expected Date:   09/21/2025    Expiration Date:   12/20/2025    Where should this test be performed:   [facility]    Does the patient weigh less than or greater than 250 lbs?:   Patient weighs less than 250 lbs    Perflutren DEFINITY (image enhancing agent) should be administered unless hypersensitivity or allergy exist:   Administer Perflutren    Reason for exam-Echo:   Other-Full Diagnosis List    Full ICD-10/Reason for Exam:   Aortic root dilation [656925]     Final Medication List:   No orders of the defined types were placed in this encounter.   There are no discontinued medications.    Current Outpatient Medications:    diltiazem  (CARDIZEM  CD) 300 MG 24 hr capsule, TAKE 1 CAPSULE BY MOUTH EVERY DAY, Disp: 90 capsule, Rfl: 2   ELIQUIS  5 MG TABS tablet, TAKE 1 TABLET BY MOUTH TWICE A DAY, Disp: 60 tablet, Rfl: 5   famotidine  (PEPCID ) 20 MG tablet, Take 1 tablet (20 mg total) by mouth daily. (Patient taking differently: Take 20 mg by mouth as needed.), Disp: 30 tablet, Rfl: 4   furosemide  (LASIX ) 20 MG tablet, TAKE 1 TABLET BY MOUTH EVERY DAY, Disp: 90 tablet, Rfl: 2   HYDROcodone  bit-homatropine (HYCODAN) 5-1.5 MG/5ML syrup, Take 5 mLs by mouth every 8 (eight) hours as needed for cough., Disp: 120 mL, Rfl: 0  rosuvastatin  (CRESTOR ) 5 MG tablet, TAKE 1 TABLET (5 MG TOTAL) BY MOUTH DAILY., Disp: 90 tablet, Rfl: 3  Consent:   NA  Disposition:   12 oh months follow-up sooner  if needed  His questions and concerns were addressed to his satisfaction. He voices understanding of the recommendations provided during this encounter.    Signed, Madonna Michele HAS, [facility]  A Division of [facility] [address], [place], [place] [address]  10/21/2024 1:25 PM

## 2024-10-21 NOTE — Patient Instructions (Signed)
 Medication Instructions:  Your physician recommends that you continue on your current medications as directed. Please refer to the Current Medication list given to you today.  *If you need a refill on your cardiac medications before your next appointment, please call your pharmacy*  Lab Work: Send labs from your upcoming PCP visit in July  BMP, Hemoglobin and Hematocrit in Dec. 2026 If you have labs (blood work) drawn today and your tests are completely normal, you will receive your results only by: MyChart Message (if you have MyChart) OR A paper copy in the mail If you have any lab test that is abnormal or we need to change your treatment, we will call you to review the results.  Testing/Procedures: Echocardiogram in Nov. 2026  Follow-Up: At [facility], you and your health needs are our priority.  As part of our continuing mission to provide you with exceptional heart care, our providers are all part of one team.  This team includes your primary Cardiologist (physician) and Advanced Practice Providers or APPs (Physician Assistants and Nurse Practitioners) who all work together to provide you with the care you need, when you need it.  Your next appointment:   1 year(s)  Provider:   Madonna Large, DO    We recommend signing up for the patient portal called MyChart.  Sign up information is provided on this After Visit Summary.  MyChart is used to connect with patients for Virtual Visits (Telemedicine).  Patients are able to view lab/test results, encounter notes, upcoming appointments, etc.  Non-urgent messages can be sent to your provider as well.   To learn more about what you can do with MyChart, go to [email or web address].

## 2024-10-27 ENCOUNTER — Other Ambulatory Visit: Payer: Self-pay

## 2024-10-27 DIAGNOSIS — I4892 Unspecified atrial flutter: Secondary | ICD-10-CM

## 2024-10-27 MED ORDER — APIXABAN 5 MG PO TABS: 5.0000 mg | ORAL_TABLET | Freq: Two times a day (BID) | ORAL | 5 refills | Status: AC

## 2024-10-27 NOTE — Telephone Encounter (Signed)
 Eliquis  5mg  refill request received. Patient is 83 years old, weight-85.3kg, Crea-1.10 on 05/16/24, Diagnosis-Aflutter, and last seen by Dr. Michele on 10/21/24. Dose is appropriate based on dosing criteria. Will send in refill to requested pharmacy.

## 2024-11-16 ENCOUNTER — Other Ambulatory Visit: Payer: Self-pay | Admitting: Cardiology

## 2025-05-25 ENCOUNTER — Other Ambulatory Visit: Payer: Self-pay

## 2025-05-26 ENCOUNTER — Ambulatory Visit: Payer: Self-pay | Admitting: Internal Medicine

## 2025-09-07 ENCOUNTER — Ambulatory Visit (HOSPITAL_COMMUNITY)
